# Patient Record
Sex: Female | Born: 1960 | Race: White | Hispanic: No | Marital: Married | Smoking: Never smoker
Health system: Southern US, Community
[De-identification: ages and names within clinical notes are randomized; demographics above are authoritative.]

## PROBLEM LIST (undated history)

## (undated) DIAGNOSIS — K219 Gastro-esophageal reflux disease without esophagitis: Secondary | ICD-10-CM

## (undated) DIAGNOSIS — M549 Dorsalgia, unspecified: Secondary | ICD-10-CM

## (undated) DIAGNOSIS — H9319 Tinnitus, unspecified ear: Secondary | ICD-10-CM

## (undated) DIAGNOSIS — M1990 Unspecified osteoarthritis, unspecified site: Secondary | ICD-10-CM

## (undated) DIAGNOSIS — Z8742 Personal history of other diseases of the female genital tract: Secondary | ICD-10-CM

## (undated) DIAGNOSIS — N6002 Solitary cyst of left breast: Secondary | ICD-10-CM

## (undated) DIAGNOSIS — F4310 Post-traumatic stress disorder, unspecified: Secondary | ICD-10-CM

## (undated) DIAGNOSIS — T7840XA Allergy, unspecified, initial encounter: Secondary | ICD-10-CM

## (undated) DIAGNOSIS — F419 Anxiety disorder, unspecified: Secondary | ICD-10-CM

## (undated) DIAGNOSIS — R519 Headache, unspecified: Secondary | ICD-10-CM

## (undated) HISTORY — DX: Solitary cyst of left breast: N60.02

## (undated) HISTORY — PX: SPINE SURGERY: SHX786

## (undated) HISTORY — PX: LAPAROSCOPIC VAGINAL HYSTERECTOMY: SUR798

## (undated) HISTORY — DX: Post-traumatic stress disorder, unspecified: F43.10

## (undated) HISTORY — DX: Personal history of other diseases of the female genital tract: Z87.42

## (undated) HISTORY — DX: Anxiety disorder, unspecified: F41.9

## (undated) HISTORY — DX: Gastro-esophageal reflux disease without esophagitis: K21.9

## (undated) HISTORY — DX: Tinnitus, unspecified ear: H93.19

## (undated) HISTORY — DX: Dorsalgia, unspecified: M54.9

## (undated) HISTORY — DX: Allergy, unspecified, initial encounter: T78.40XA

---

## 2009-03-16 ENCOUNTER — Ambulatory Visit: Payer: Self-pay | Admitting: Psychology

## 2009-05-10 ENCOUNTER — Ambulatory Visit: Payer: Self-pay | Admitting: Gastroenterology

## 2009-08-18 ENCOUNTER — Encounter
Admission: RE | Admit: 2009-08-18 | Discharge: 2009-08-23 | Payer: Self-pay | Admitting: Physical Medicine & Rehabilitation

## 2009-08-23 ENCOUNTER — Ambulatory Visit: Payer: Self-pay | Admitting: Physical Medicine & Rehabilitation

## 2014-06-13 DIAGNOSIS — Z800 Family history of malignant neoplasm of digestive organs: Secondary | ICD-10-CM | POA: Insufficient documentation

## 2014-06-13 DIAGNOSIS — N959 Unspecified menopausal and perimenopausal disorder: Secondary | ICD-10-CM | POA: Insufficient documentation

## 2014-06-13 DIAGNOSIS — R61 Generalized hyperhidrosis: Secondary | ICD-10-CM | POA: Insufficient documentation

## 2014-06-13 DIAGNOSIS — R252 Cramp and spasm: Secondary | ICD-10-CM | POA: Insufficient documentation

## 2014-06-13 DIAGNOSIS — G4700 Insomnia, unspecified: Secondary | ICD-10-CM | POA: Insufficient documentation

## 2014-06-13 DIAGNOSIS — Z9889 Other specified postprocedural states: Secondary | ICD-10-CM | POA: Insufficient documentation

## 2014-06-13 DIAGNOSIS — R0982 Postnasal drip: Secondary | ICD-10-CM | POA: Insufficient documentation

## 2014-06-13 DIAGNOSIS — K219 Gastro-esophageal reflux disease without esophagitis: Secondary | ICD-10-CM | POA: Insufficient documentation

## 2014-08-22 DIAGNOSIS — Z8669 Personal history of other diseases of the nervous system and sense organs: Secondary | ICD-10-CM | POA: Insufficient documentation

## 2014-08-31 DIAGNOSIS — R110 Nausea: Secondary | ICD-10-CM | POA: Insufficient documentation

## 2016-05-27 HISTORY — PX: SPINAL FUSION: SHX223

## 2017-05-27 DIAGNOSIS — Z87442 Personal history of urinary calculi: Secondary | ICD-10-CM

## 2017-05-27 HISTORY — DX: Personal history of urinary calculi: Z87.442

## 2017-05-27 HISTORY — PX: KIDNEY STONE SURGERY: SHX686

## 2019-08-12 ENCOUNTER — Ambulatory Visit (INDEPENDENT_AMBULATORY_CARE_PROVIDER_SITE_OTHER): Payer: 59 | Admitting: Adult Health

## 2019-08-12 ENCOUNTER — Encounter: Payer: Self-pay | Admitting: Adult Health

## 2019-08-12 ENCOUNTER — Other Ambulatory Visit: Payer: Self-pay

## 2019-08-12 VITALS — BP 130/86 | HR 87 | Temp 96.9°F | Resp 16 | Ht 68.0 in | Wt 167.8 lb

## 2019-08-12 DIAGNOSIS — Z87898 Personal history of other specified conditions: Secondary | ICD-10-CM

## 2019-08-12 DIAGNOSIS — R05 Cough: Secondary | ICD-10-CM

## 2019-08-12 DIAGNOSIS — Z9889 Other specified postprocedural states: Secondary | ICD-10-CM

## 2019-08-12 DIAGNOSIS — E785 Hyperlipidemia, unspecified: Secondary | ICD-10-CM | POA: Insufficient documentation

## 2019-08-12 DIAGNOSIS — R031 Nonspecific low blood-pressure reading: Secondary | ICD-10-CM | POA: Insufficient documentation

## 2019-08-12 DIAGNOSIS — N390 Urinary tract infection, site not specified: Secondary | ICD-10-CM | POA: Insufficient documentation

## 2019-08-12 DIAGNOSIS — M25551 Pain in right hip: Secondary | ICD-10-CM

## 2019-08-12 DIAGNOSIS — Z13220 Encounter for screening for lipoid disorders: Secondary | ICD-10-CM

## 2019-08-12 DIAGNOSIS — Z0000 Encounter for general adult medical examination without abnormal findings: Secondary | ICD-10-CM

## 2019-08-12 DIAGNOSIS — G8929 Other chronic pain: Secondary | ICD-10-CM

## 2019-08-12 DIAGNOSIS — M25571 Pain in right ankle and joints of right foot: Secondary | ICD-10-CM

## 2019-08-12 DIAGNOSIS — M25562 Pain in left knee: Secondary | ICD-10-CM | POA: Diagnosis not present

## 2019-08-12 DIAGNOSIS — M5126 Other intervertebral disc displacement, lumbar region: Secondary | ICD-10-CM | POA: Insufficient documentation

## 2019-08-12 DIAGNOSIS — Z90711 Acquired absence of uterus with remaining cervical stump: Secondary | ICD-10-CM

## 2019-08-12 DIAGNOSIS — E349 Endocrine disorder, unspecified: Secondary | ICD-10-CM | POA: Insufficient documentation

## 2019-08-12 DIAGNOSIS — N200 Calculus of kidney: Secondary | ICD-10-CM | POA: Insufficient documentation

## 2019-08-12 DIAGNOSIS — F419 Anxiety disorder, unspecified: Secondary | ICD-10-CM | POA: Insufficient documentation

## 2019-08-12 DIAGNOSIS — Z79890 Hormone replacement therapy (postmenopausal): Secondary | ICD-10-CM

## 2019-08-12 DIAGNOSIS — R059 Cough, unspecified: Secondary | ICD-10-CM

## 2019-08-12 DIAGNOSIS — E559 Vitamin D deficiency, unspecified: Secondary | ICD-10-CM

## 2019-08-12 DIAGNOSIS — Z8659 Personal history of other mental and behavioral disorders: Secondary | ICD-10-CM

## 2019-08-12 DIAGNOSIS — M5431 Sciatica, right side: Secondary | ICD-10-CM | POA: Insufficient documentation

## 2019-08-12 DIAGNOSIS — IMO0002 Reserved for concepts with insufficient information to code with codable children: Secondary | ICD-10-CM | POA: Insufficient documentation

## 2019-08-12 DIAGNOSIS — Z1231 Encounter for screening mammogram for malignant neoplasm of breast: Secondary | ICD-10-CM | POA: Diagnosis not present

## 2019-08-12 DIAGNOSIS — F4310 Post-traumatic stress disorder, unspecified: Secondary | ICD-10-CM | POA: Insufficient documentation

## 2019-08-12 DIAGNOSIS — Z8739 Personal history of other diseases of the musculoskeletal system and connective tissue: Secondary | ICD-10-CM

## 2019-08-12 DIAGNOSIS — R062 Wheezing: Secondary | ICD-10-CM | POA: Diagnosis not present

## 2019-08-12 DIAGNOSIS — R32 Unspecified urinary incontinence: Secondary | ICD-10-CM | POA: Insufficient documentation

## 2019-08-12 DIAGNOSIS — IMO0001 Reserved for inherently not codable concepts without codable children: Secondary | ICD-10-CM | POA: Insufficient documentation

## 2019-08-12 DIAGNOSIS — M7631 Iliotibial band syndrome, right leg: Secondary | ICD-10-CM

## 2019-08-12 DIAGNOSIS — R058 Other specified cough: Secondary | ICD-10-CM

## 2019-08-12 DIAGNOSIS — G5700 Lesion of sciatic nerve, unspecified lower limb: Secondary | ICD-10-CM | POA: Insufficient documentation

## 2019-08-12 DIAGNOSIS — M542 Cervicalgia: Secondary | ICD-10-CM | POA: Insufficient documentation

## 2019-08-12 LAB — POCT URINALYSIS DIPSTICK
Bilirubin, UA: NEGATIVE
Blood, UA: NEGATIVE
Glucose, UA: NEGATIVE
Ketones, UA: NEGATIVE
Leukocytes, UA: NEGATIVE
Nitrite, UA: NEGATIVE
Protein, UA: NEGATIVE
Spec Grav, UA: 1.01 (ref 1.010–1.025)
Urobilinogen, UA: 0.2 E.U./dL
pH, UA: 7.5 (ref 5.0–8.0)

## 2019-08-12 MED ORDER — ALBUTEROL SULFATE HFA 108 (90 BASE) MCG/ACT IN AERS: 1.0000 | INHALATION_SPRAY | Freq: Four times a day (QID) | RESPIRATORY_TRACT | 1 refills | Status: DC | PRN

## 2019-08-12 NOTE — Progress Notes (Signed)
Urine was negative. 

## 2019-08-12 NOTE — Patient Instructions (Addendum)
Call to schedule your screening mammogram. Your orders have been placed for your exam.  Let our office know if you have questions, concerns, or any difficulty scheduling.  If normal results then yearly screening mammograms are recommended unless you notice  Changes in your breast then you should schedule a follow up office visit. If abnormal results  Further imaging will be warranted and sooner follow up as determined by the radiologist at the [facility].   [facility] [place], [place] [address]  Main: [phone]      Albuterol inhalation aerosol What is this medicine? ALBUTEROL (al Normajean Glasgow) is a bronchodilator. It helps open up the airways in your lungs to make it easier to breathe. This medicine is used to treat and to prevent bronchospasm. This medicine may be used for other purposes; ask your health care provider or pharmacist if you have questions. COMMON BRAND NAME(S): Proair HFA, Proventil, Proventil HFA, Respirol, Ventolin, Ventolin HFA What should I tell my health care provider before I take this medicine? They need to know if you have any of the following conditions:  diabetes  heart disease or irregular heartbeat  high blood pressure  pheochromocytoma  seizures  thyroid disease  an unusual or allergic reaction to albuterol, levalbuterol, other medicines, foods, dyes, or preservatives  pregnant or trying to get pregnant  breast-feeding How should I use this medicine? This medicine is for inhalation through the mouth. Follow the directions on your prescription label. Take your medicine at regular intervals. Do not use more often than directed. Make sure that you are using your inhaler correctly. Ask your doctor or health care provider if you have any questions. Talk to your pediatrician regarding the use of this medicine in children. While this drug may be prescribed for children as young as 4 years for  selected conditions, precautions do apply. Overdosage: If you think you have taken too much of this medicine contact a poison control center or emergency room at once. NOTE: This medicine is only for you. Do not share this medicine with others. What if I miss a dose? If you miss a dose, use it as soon as you can. If it is almost time for your next dose, use only that dose. Do not use double or extra doses. What may interact with this medicine?  anti-infectives like chloroquine and pentamidine  caffeine  cisapride  diuretics  medicines for colds  medicines for depression or for emotional or psychotic conditions  medicines for weight loss including some herbal products  methadone  some antibiotics like clarithromycin, erythromycin, levofloxacin, and linezolid  some heart medicines  steroid hormones like dexamethasone, cortisone, hydrocortisone  theophylline  thyroid hormones This list may not describe all possible interactions. Give your health care provider a list of all the medicines, herbs, non-prescription drugs, or dietary supplements you use. Also tell them if you smoke, drink alcohol, or use illegal drugs. Some items may interact with your medicine. What should I watch for while using this medicine? Tell your doctor or health care professional if your symptoms do not improve. Do not use extra albuterol. If your asthma or bronchitis gets worse while you are using this medicine, call your doctor right away. If your mouth gets dry try chewing sugarless gum or sucking hard candy. Drink water as directed. What side effects may I notice from receiving this medicine? Side effects that you should report to your doctor or health care professional  as soon as possible:  allergic reactions like skin rash, itching or hives, swelling of the face, lips, or tongue  breathing problems  chest pain  feeling faint or lightheaded, falls  high blood pressure  irregular  heartbeat  fever  muscle cramps or weakness  pain, tingling, numbness in the hands or feet  vomiting Side effects that usually do not require medical attention (report to your doctor or health care professional if they continue or are bothersome):  changes in taste  cough  dry mouth  headache  nervousness or trembling  stomach upset  stuffy or runny nose  throat irritation  trouble sleeping This list may not describe all possible side effects. Call your doctor for medical advice about side effects. You may report side effects to FDA at 1-800-FDA-1088. Where should I keep my medicine? Keep out of the reach of children. Store Proventil HFA and ProAir HFA at room temperature between 15 and 25 degrees C (59 and 77 degrees F). Store Ventolin HFA at room temperature between 20 and 25 degrees C (68 and 77 degrees F); it may be stored between 15 and 30 degrees C (59 and 86 degrees F) on occasion. The contents are under pressure and may burst when exposed to heat or flame. Do not freeze. This medicine does not work as well if it is too cold. Throw away the inhaler when the dose counter displays "0" or after the expiration date on the package, whichever comes first. Ventolin HFA should be thrown away 12 months after removing it from the foil pouch. NOTE: This sheet is a summary. It may not cover all possible information. If you have questions about this medicine, talk to your doctor, pharmacist, or health care provider.  2020 Elsevier/Gold Standard (2018-08-27 12:46:54)
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
## 2019-08-13 ENCOUNTER — Telehealth: Payer: Self-pay | Admitting: Obstetrics and Gynecology

## 2019-08-13 NOTE — Telephone Encounter (Signed)
[facility] referring for Hormone replacement therapy, postmenopausal. Wants female provider. Called and left voicemail for patient to call back to be schedule

## 2019-08-16 ENCOUNTER — Encounter: Payer: Self-pay | Admitting: Adult Health

## 2019-08-16 DIAGNOSIS — R058 Other specified cough: Secondary | ICD-10-CM | POA: Insufficient documentation

## 2019-08-16 DIAGNOSIS — M25562 Pain in left knee: Secondary | ICD-10-CM | POA: Insufficient documentation

## 2019-08-16 DIAGNOSIS — Z8739 Personal history of other diseases of the musculoskeletal system and connective tissue: Secondary | ICD-10-CM | POA: Insufficient documentation

## 2019-08-16 DIAGNOSIS — Z114 Encounter for screening for human immunodeficiency virus [HIV]: Secondary | ICD-10-CM | POA: Insufficient documentation

## 2019-08-16 DIAGNOSIS — Z90711 Acquired absence of uterus with remaining cervical stump: Secondary | ICD-10-CM | POA: Insufficient documentation

## 2019-08-16 DIAGNOSIS — M25551 Pain in right hip: Secondary | ICD-10-CM | POA: Insufficient documentation

## 2019-08-16 DIAGNOSIS — R05 Cough: Secondary | ICD-10-CM | POA: Insufficient documentation

## 2019-08-16 DIAGNOSIS — E559 Vitamin D deficiency, unspecified: Secondary | ICD-10-CM | POA: Insufficient documentation

## 2019-08-16 DIAGNOSIS — Z79890 Hormone replacement therapy (postmenopausal): Secondary | ICD-10-CM | POA: Insufficient documentation

## 2019-08-16 DIAGNOSIS — Z13220 Encounter for screening for lipoid disorders: Secondary | ICD-10-CM | POA: Insufficient documentation

## 2019-08-16 DIAGNOSIS — M25571 Pain in right ankle and joints of right foot: Secondary | ICD-10-CM | POA: Insufficient documentation

## 2019-08-16 DIAGNOSIS — Z8659 Personal history of other mental and behavioral disorders: Secondary | ICD-10-CM | POA: Insufficient documentation

## 2019-08-16 DIAGNOSIS — G8929 Other chronic pain: Secondary | ICD-10-CM | POA: Insufficient documentation

## 2019-08-16 DIAGNOSIS — M7631 Iliotibial band syndrome, right leg: Secondary | ICD-10-CM | POA: Insufficient documentation

## 2019-08-16 DIAGNOSIS — R062 Wheezing: Secondary | ICD-10-CM | POA: Insufficient documentation

## 2019-08-18 ENCOUNTER — Other Ambulatory Visit: Payer: Self-pay | Admitting: Adult Health

## 2019-08-18 ENCOUNTER — Ambulatory Visit
Admission: RE | Admit: 2019-08-18 | Discharge: 2019-08-18 | Disposition: A | Discharge status: Home / Self Care | Payer: 59 | Source: Ambulatory Visit | Attending: Adult Health | Admitting: Adult Health

## 2019-08-18 ENCOUNTER — Other Ambulatory Visit: Payer: Self-pay

## 2019-08-18 DIAGNOSIS — K2100 Gastro-esophageal reflux disease with esophagitis, without bleeding: Secondary | ICD-10-CM

## 2019-08-18 DIAGNOSIS — R05 Cough: Secondary | ICD-10-CM | POA: Diagnosis present

## 2019-08-18 DIAGNOSIS — D126 Benign neoplasm of colon, unspecified: Secondary | ICD-10-CM

## 2019-08-18 IMAGING — CR DG CHEST 2V
1 series · 2 of 2 positions shown · non-contrast
Comparison: None.

CLINICAL DATA: Productive cough and congestion

EXAM:
CHEST - 2 VIEW

[Series 1: dg chest 2 view · 0.14mm/px · 2 of 2 slices shown]
[im 1/2]
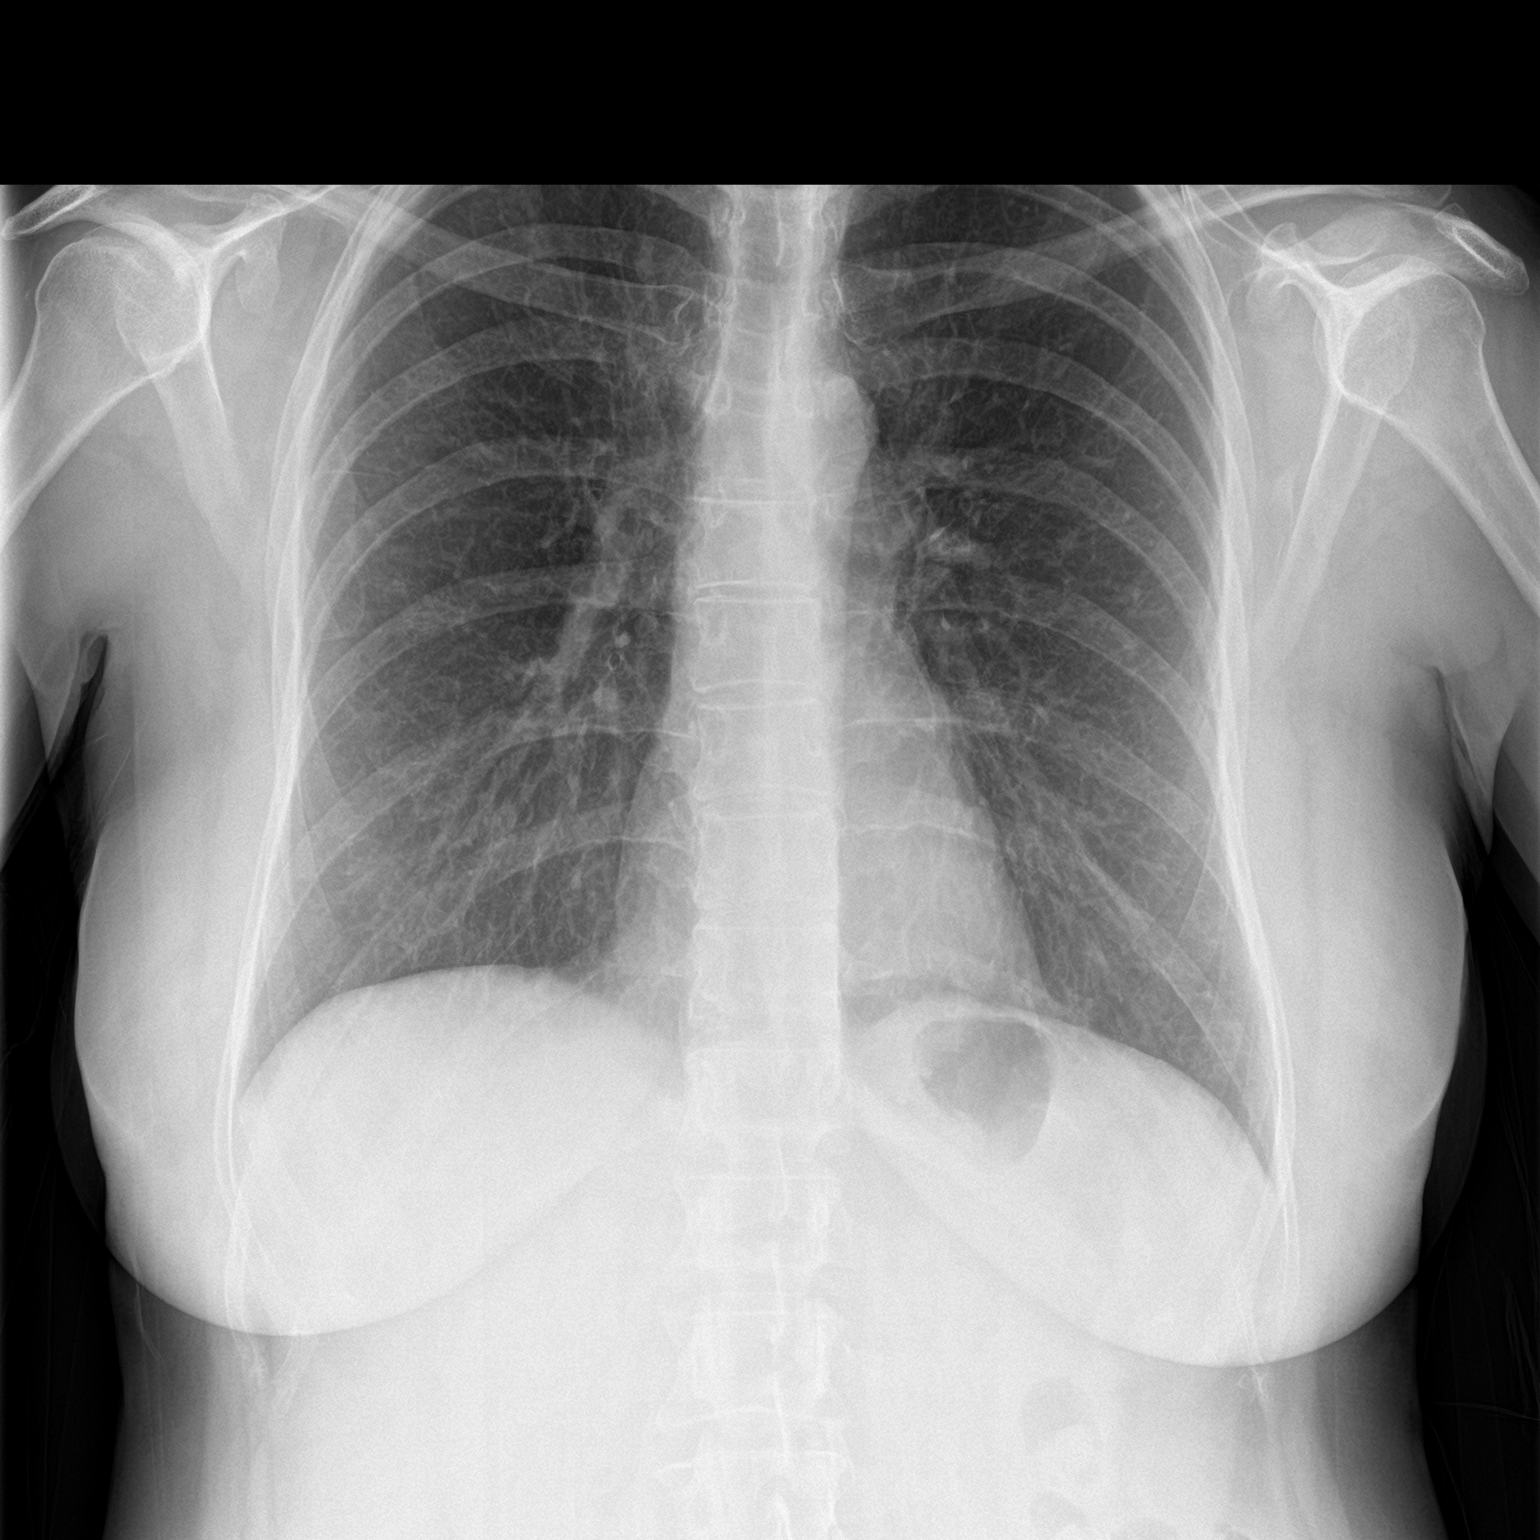
[im 2/2]
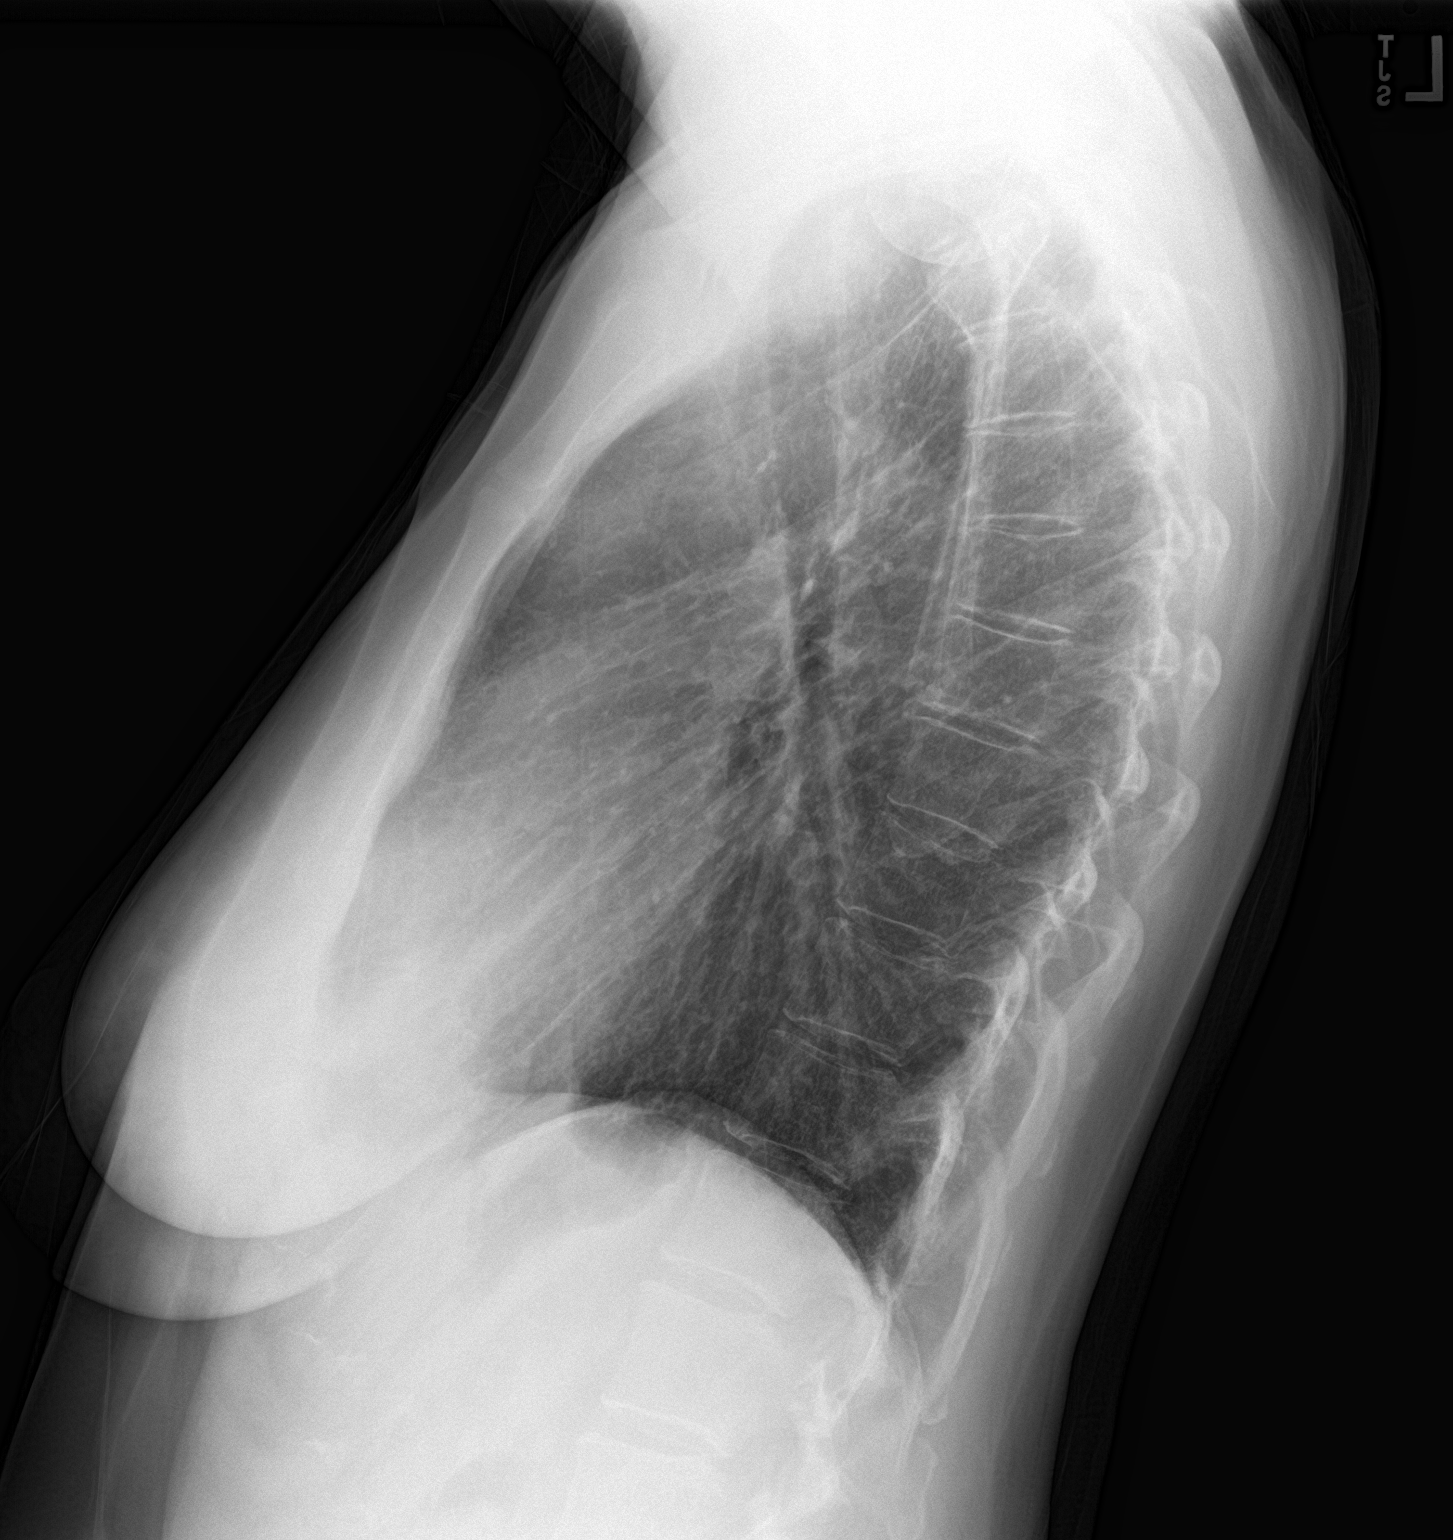

[2 of 2 positions shown; findings below may reference images not displayed]

FINDINGS: Lungs are clear. Heart size and pulmonary vascularity are normal. No
adenopathy. No bone lesions.
IMPRESSION: No abnormality noted.

## 2019-08-18 NOTE — Progress Notes (Signed)
Chest x ray is within normal limits.

## 2019-08-18 NOTE — Addendum Note (Signed)
Addended by: Doreen Beam on: 08/18/2019 08:58 AM   Modules accepted: Orders

## 2019-08-20 ENCOUNTER — Other Ambulatory Visit: Payer: Self-pay | Admitting: Adult Health

## 2019-08-20 DIAGNOSIS — R05 Cough: Secondary | ICD-10-CM

## 2019-08-20 DIAGNOSIS — R058 Other specified cough: Secondary | ICD-10-CM

## 2019-08-20 DIAGNOSIS — A493 Mycoplasma infection, unspecified site: Secondary | ICD-10-CM

## 2019-08-20 MED ORDER — BENZONATATE 100 MG PO CAPS: ORAL_CAPSULE | ORAL | 0 refills | Status: DC

## 2019-08-20 MED ORDER — AZITHROMYCIN 250 MG PO TABS: ORAL_TABLET | ORAL | 0 refills | Status: DC

## 2019-08-20 MED ORDER — PREDNISONE 10 MG (21) PO TBPK: ORAL_TABLET | ORAL | 0 refills | Status: DC

## 2019-08-20 NOTE — Progress Notes (Signed)
Provider called patient 08/20/2019 8:45 am to review within normal limits chest x ray and lab work that has resulted so far. Quantiferon Gold, testosterone and estrogen free/ total as well as progesterone is pending, she will be seeing [facility] Din for gynecology 08/25/2019  1.She has had productive cough with green / yellow sputum, since November, she has IGG positive for Mycoplasma pneumonia, IGM is negative. She denies any previous infection. Suspect she had Mycoplasma pneumonia in November and will treat as such and see if she has improvement in symptoms.  She will follow back up with me in 2 weeks. She is in agreement to azithromycin and Prednisone dose pack. She will use Benzonatate as needed.  2.CBC is within normal limits no anemia, or signs of blood infection. CMP glucose 100, discussed, kidney and liver function within normal limits.   3.Total cholesterol and LDL elevated.  Discuss lifestyle modification with patient e.g. increase exercise, fiber, fruits, vegetables, lean meat, and omega 3/fish intake and decrease saturated fat. Her cholesterol is elevated she has not been exercising as much due to her joint pain, of which she is going to orthopedics for. She does eat a lot of eggs, has dairy and red meat, she will try to cut some of these down and exercise as tolerated. Will recheck lipids in 4 to 6 months. Dad has history of high cholesterol. Discussed egg whites are ok, and she avoids processed foods already. She does not want a statin at this time. Will continue to monitor.

## 2019-08-20 NOTE — Progress Notes (Signed)
Meds ordered this encounter  Medications  . azithromycin (ZITHROMAX) 250 MG tablet    Sig: By mouth Take 2 tablets day 1 (500mg  total) and 1 tablet ( 250 mg ) on days 2,3,4,5.    Dispense:  6 tablet    Refill:  0  . predniSONE (STERAPRED UNI-PAK 21 TAB) 10 MG (21) TBPK tablet    Sig: PO: Take 6 tablets on day 1:Take 5 tablets day 2:Take 4 tablets day 3: Take 3 tablets day 4:Take 2 tablets day five: 5 Take 1 tablet day 6    Dispense:  21 tablet    Refill:  0  . benzonatate (TESSALON) 100 MG capsule    Sig: 100 mg (one capsule) up to  200 mg(two capsules) by mouth  TID only PRN. May cause drowsiness.    Dispense:  50 capsule    Refill:  0    Follow up in two weeks. Productive cough since November.  Mycoplasma IGG positive for previous infection.IGM negative.  She does not ever remember having any respiratory infections prior. She has had green sputum since November it has lightened in color. Cough at night. Recommend covid testing as well.  Follow up recheck in 2 weeks by phone. In person evaluation if worsens at anytime.

## 2019-08-21 LAB — HEMOGLOBIN A1C
Est. average glucose Bld gHb Est-mCnc: 114 mg/dL
Hgb A1c MFr Bld: 5.6 % (ref 4.8–5.6)

## 2019-08-21 LAB — SPECIMEN STATUS REPORT

## 2019-08-22 LAB — COMPREHENSIVE METABOLIC PANEL
ALT: 21 IU/L (ref 0–32)
AST: 19 IU/L (ref 0–40)
Albumin/Globulin Ratio: 1.7 (ref 1.2–2.2)
Albumin: 4.3 g/dL (ref 3.8–4.9)
Alkaline Phosphatase: 80 IU/L (ref 39–117)
BUN/Creatinine Ratio: 23 (ref 9–23)
BUN: 22 mg/dL (ref 6–24)
Bilirubin Total: 0.4 mg/dL (ref 0.0–1.2)
CO2: 23 mmol/L (ref 20–29)
Calcium: 9.3 mg/dL (ref 8.7–10.2)
Chloride: 100 mmol/L (ref 96–106)
Creatinine, Ser: 0.97 mg/dL (ref 0.57–1.00)
GFR calc Af Amer: 74 mL/min/{1.73_m2} (ref >59)
GFR calc non Af Amer: 65 mL/min/{1.73_m2} (ref >59)
Globulin, Total: 2.5 g/dL (ref 1.5–4.5)
Glucose: 100 mg/dL — ABNORMAL HIGH (ref 65–99)
Potassium: 4.1 mmol/L (ref 3.5–5.2)
Sodium: 137 mmol/L (ref 134–144)
Total Protein: 6.8 g/dL (ref 6.0–8.5)

## 2019-08-22 LAB — CBC WITH DIFFERENTIAL/PLATELET
Basophils Absolute: 0.1 10*3/uL (ref 0.0–0.2)
Basos: 1 %
EOS (ABSOLUTE): 0.2 10*3/uL (ref 0.0–0.4)
Eos: 2 %
Hematocrit: 39.4 % (ref 34.0–46.6)
Hemoglobin: 13.3 g/dL (ref 11.1–15.9)
Immature Grans (Abs): 0 10*3/uL (ref 0.0–0.1)
Immature Granulocytes: 0 %
Lymphocytes Absolute: 1.3 10*3/uL (ref 0.7–3.1)
Lymphs: 17 %
MCH: 30.7 pg (ref 26.6–33.0)
MCHC: 33.8 g/dL (ref 31.5–35.7)
MCV: 91 fL (ref 79–97)
Monocytes Absolute: 0.6 10*3/uL (ref 0.1–0.9)
Monocytes: 7 %
Neutrophils Absolute: 5.5 10*3/uL (ref 1.4–7.0)
Neutrophils: 73 %
Platelets: 261 10*3/uL (ref 150–450)
RBC: 4.33 x10E6/uL (ref 3.77–5.28)
RDW: 12.7 % (ref 11.7–15.4)
WBC: 7.6 10*3/uL (ref 3.4–10.8)

## 2019-08-22 LAB — LIPID PANEL W/O CHOL/HDL RATIO
Cholesterol, Total: 226 mg/dL — ABNORMAL HIGH (ref 100–199)
HDL: 45 mg/dL (ref >39)
LDL Chol Calc (NIH): 164 mg/dL — ABNORMAL HIGH (ref 0–99)
Triglycerides: 93 mg/dL (ref 0–149)
VLDL Cholesterol Cal: 17 mg/dL (ref 5–40)

## 2019-08-22 LAB — TSH: TSH: 2.76 u[IU]/mL (ref 0.450–4.500)

## 2019-08-22 LAB — ESTROGENS, TOTAL: Estrogen: 75 pg/mL

## 2019-08-22 LAB — TESTOSTERONE,FREE AND TOTAL
Testosterone, Free: 0.5 pg/mL (ref 0.0–4.2)
Testosterone: <3 ng/dL — ABNORMAL LOW (ref 3–41)

## 2019-08-22 LAB — QUANTIFERON-TB GOLD PLUS
QuantiFERON Mitogen Value: >10 IU/mL
QuantiFERON Nil Value: 0.03 IU/mL
QuantiFERON TB1 Ag Value: 0.03 IU/mL
QuantiFERON TB2 Ag Value: 0.02 IU/mL
QuantiFERON-TB Gold Plus: NEGATIVE

## 2019-08-22 LAB — MYCOPLASMA PNEUMONIAE,IGG,IGM
Mycoplasma pneumo IgG: 172 U/mL — ABNORMAL HIGH (ref 0–99)
Mycoplasma pneumo IgM: <770 U/mL (ref 0–769)

## 2019-08-22 LAB — PROGESTERONE: Progesterone: 0.1 ng/mL

## 2019-08-22 LAB — VITAMIN D 25 HYDROXY (VIT D DEFICIENCY, FRACTURES): Vit D, 25-Hydroxy: 60.3 ng/mL (ref 30.0–100.0)

## 2019-08-24 ENCOUNTER — Telehealth: Payer: Self-pay

## 2019-08-24 NOTE — Telephone Encounter (Signed)
I reached out to patient to review over lab results and to advise her that we added a HgbA1C to blood work that was drawn. [facility] triage can advise patient. KW

## 2019-08-24 NOTE — Progress Notes (Signed)
Flinchum, Kelby Aline, FNP   Chief Complaint  Patient presents with  . HRT    needs someone to manage her HRT  . Rash    around/near anus    HPI:      Ms. BRUNELLA TRANSOU is a 59 y.o. No obstetric history on file. who LMP was No LMP recorded. Patient has had a hysterectomy., presents today for NP mgmt for HRT, referred by PCP. Pt is on vivelle dot patch 0.075 mg, increased from 0.05 mg dose recently due to persistent vasomotor sx. Pt's sx improved. Rx'd by different MD but pt recently moved here and needs to establish new provider.  Also with increased anxiety/depression and insomnia with menopause, but sx improved (not resolved) with ERT. Still has to take xanax 0.25 mg occas prn anxiety and lunesta about twice wkly for insomnia. Never tried progesterone but was reading about it. Did testosterone pellet in past and then in cream form, but started to have hair loss and increased facial hair. Did have improved libido. No longer doing testosterone. Had normal estradiol labs, low progesterone (normal for postmenopause) and total testosterone with normal free testosterone with PCP recently. S/p hyst due to endometriosis.  She is sex active, occas vag drness. No pain/bleeding. No hx of abn paps. No other vag sx.   Also has rectal irritation/rash for several months. Treated with monistat without relief. Wears cotton lined underwear and pantyliners. No vag sx.   Last mammo 2016 I can see in Epic, but done more recently per pt; new one ordered through PCP 2021 Colonoscopy 2019  Past Medical History:  Diagnosis Date  . Allergy   . Anxiety   . Back pain   . GERD (gastroesophageal reflux disease)   . PTSD (post-traumatic stress disorder)     Past Surgical History:  Procedure Laterality Date  . ABDOMINAL HYSTERECTOMY  2019  . SPINAL FUSION  2018  . SPINE SURGERY      Family History  Problem Relation Age of Onset  . Autoimmune disease Mother   . Heart attack Father 16  . Cancer -  Colon Maternal Grandmother   . Alzheimer's disease Maternal Grandmother   . Heart attack Maternal Grandfather   . Bone cancer Paternal Grandmother     Social History   Socioeconomic History  . Marital status: Married    Spouse name: Not on file  . Number of children: Not on file  . Years of education: Not on file  . Highest education level: Not on file  Occupational History  . Not on file  Tobacco Use  . Smoking status: Never Smoker  . Smokeless tobacco: Never Used  Substance and Sexual Activity  . Alcohol use: Yes    Alcohol/week: 1.0 standard drinks    Types: 1 Glasses of wine per week  . Drug use: Never  . Sexual activity: Yes    Birth control/protection: None, Surgical    Comment: Hysterectomy  Other Topics Concern  . Not on file  Social History Narrative  . Not on file   Social Determinants of Health   Financial Resource Strain:   . Difficulty of Paying Living Expenses:   Food Insecurity:   . Worried About Charity fundraiser in the Last Year:   . Arboriculturist in the Last Year:   Transportation Needs:   . Film/video editor (Medical):   Marland Kitchen Lack of Transportation (Non-Medical):   Physical Activity:   . Days of Exercise per  Week:   . Minutes of Exercise per Session:   Stress:   . Feeling of Stress :   Social Connections:   . Frequency of Communication with Friends and Family:   . Frequency of Social Gatherings with Friends and Family:   . Attends Religious Services:   . Active Member of Clubs or Organizations:   . Attends Archivist Meetings:   Marland Kitchen Marital Status:   Intimate Partner Violence:   . Fear of Current or Ex-Partner:   . Emotionally Abused:   Marland Kitchen Physically Abused:   . Sexually Abused:     Outpatient Medications Prior to Visit  Medication Sig Dispense Refill  . albuterol (VENTOLIN HFA) 108 (90 Base) MCG/ACT inhaler Inhale 1-2 puffs into the lungs every 6 (six) hours as needed for wheezing or shortness of breath. 18 g 1  .  ALPRAZolam (XANAX) 0.5 MG tablet Take 0.5 mg by mouth 3 (three) times daily.    Marland Kitchen azithromycin (ZITHROMAX) 250 MG tablet By mouth Take 2 tablets day 1 (500mg  total) and 1 tablet ( 250 mg ) on days 2,3,4,5. 6 tablet 0  . benzonatate (TESSALON) 100 MG capsule 100 mg (one capsule) up to  200 mg(two capsules) by mouth  TID only PRN. May cause drowsiness. 50 capsule 0  . Cetirizine HCl 10 MG CAPS Take by mouth.    . eszopiclone (LUNESTA) 2 MG TABS tablet Take by mouth.    . loratadine (CLARITIN) 10 MG tablet Take by mouth.    . methocarbamol (ROBAXIN) 500 MG tablet Take 1,000 mg by mouth 4 (four) times daily.    Marland Kitchen omeprazole (PRILOSEC) 40 MG capsule Take 40 mg by mouth 2 (two) times daily.    . predniSONE (STERAPRED UNI-PAK 21 TAB) 10 MG (21) TBPK tablet PO: Take 6 tablets on day 1:Take 5 tablets day 2:Take 4 tablets day 3: Take 3 tablets day 4:Take 2 tablets day five: 5 Take 1 tablet day 6 21 tablet 0  . sertraline (ZOLOFT) 50 MG tablet Take 50 mg by mouth daily.    . SUMAtriptan (IMITREX) 100 MG tablet Take by mouth.    . estradiol (VIVELLE-DOT) 0.075 MG/24HR Place 1 patch onto the skin 2 (two) times a week.     No facility-administered medications prior to visit.      ROS:  Review of Systems  Constitutional: Negative for fever.  Gastrointestinal: Negative for blood in stool, constipation, diarrhea, nausea and vomiting.  Genitourinary: Negative for dyspareunia, dysuria, flank pain, frequency, hematuria, urgency, vaginal bleeding, vaginal discharge and vaginal pain.  Musculoskeletal: Negative for back pain.  Skin: Positive for rash.  Psychiatric/Behavioral: Positive for agitation.  BREAST: No symptoms   OBJECTIVE:   Vitals:  BP 110/70   Ht 5\' 8"  (1.727 m)   Wt 167 lb (75.8 kg)   BMI 25.39 kg/m   Physical Exam Vitals reviewed.  Constitutional:      Appearance: She is well-developed.  Pulmonary:     Effort: Pulmonary effort is normal.  Genitourinary:    General: Normal vulva.      Pubic Area: No rash.      Labia:        Right: No rash, tenderness or lesion.        Left: No rash, tenderness or lesion.      Vagina: Normal. No vaginal discharge, erythema or tenderness.     Uterus: Absent. Not enlarged and not tender.      Adnexa: Right adnexa normal and left adnexa  normal.       Right: No mass or tenderness.         Left: No mass or tenderness.       Rectum: No anal fissure or external hemorrhoid.       Comments: ERYTHEMATOUS RASH BILAT PERINEAL AREA; BORDER WITH SCALE Musculoskeletal:        General: Normal range of motion.     Cervical back: Normal range of motion.  Skin:    General: Skin is warm and dry.  Neurological:     General: No focal deficit present.     Mental Status: She is alert and oriented to person, place, and time.  Psychiatric:        Mood and Affect: Mood normal.        Behavior: Behavior normal.        Thought Content: Thought content normal.        Judgment: Judgment normal.     Assessment/Plan: Vasomotor symptoms due to menopause - Plan: progesterone (PROMETRIUM) 100 MG capsule, estradiol (VIVELLE-DOT) 0.075 MG/24HR; Discussed sx. Will add prometrium to help with insomnia sx, may help with anxiety. Rx prometrium. F/u via phone in 3 months. If doing well, can try to decrease ERT to 0.05 mg dose again. Rx RF vivelle dot 0.075 mg in meantime. Also discussed testosterone but prefer to add prog first.   Hormone replacement therapy (HRT) - Plan: progesterone (PROMETRIUM) 100 MG capsule, estradiol (VIVELLE-DOT) 0.075 MG/24HR  Rectal itching - Plan: clotrimazole-betamethasone (LOTRISONE) cream; Tinear cruris. Rx lotrisone crm. Keep dry/desitin as moisture barrier. Cotton underwear. F/u prn.   Anxiety--takes xanax sparingly prn. F/u prn.   Other insomnia--takes lunesta. F/u prn.   Has mammo order for screening with PCP.   Meds ordered this encounter  Medications  . progesterone (PROMETRIUM) 100 MG capsule    Sig: Take 1 cap nightly,  6 nights on, 1 night off    Dispense:  90 capsule    Refill:  0    Order Specific Question:   Supervising Provider    Answer:   Gae Dry [id number]  . clotrimazole-betamethasone (LOTRISONE) cream    Sig: Apply externally BID for 2 wks    Dispense:  15 g    Refill:  0    Order Specific Question:   Supervising Provider    Answer:   Gae Dry [id number]  . estradiol (VIVELLE-DOT) 0.075 MG/24HR    Sig: Place 1 patch onto the skin 2 (two) times a week.    Dispense:  24 patch    Refill:  0    Order Specific Question:   Supervising Provider    Answer:   Gae Dry [id number]      Return in about 1 year (around 08/24/2020).  Georganna Maxson B. Anneka Studer, PA-C 08/25/2019 10:55 AM

## 2019-08-24 NOTE — Telephone Encounter (Signed)
Pt. Called back and given message. Verbalizes understanding. Sees her OB/GYN tomorrow.

## 2019-08-24 NOTE — Telephone Encounter (Signed)
-----   Message from Minette Headland, [place] sent at 08/23/2019 10:07 AM EDT ----- Attempted to reach out to patient, there is no voice answering service. Will try reaching out patient at a later time. KW

## 2019-08-25 ENCOUNTER — Encounter: Payer: Self-pay | Admitting: Obstetrics and Gynecology

## 2019-08-25 ENCOUNTER — Ambulatory Visit (INDEPENDENT_AMBULATORY_CARE_PROVIDER_SITE_OTHER): Payer: 59 | Admitting: Obstetrics and Gynecology

## 2019-08-25 ENCOUNTER — Other Ambulatory Visit: Payer: Self-pay

## 2019-08-25 ENCOUNTER — Telehealth: Payer: Self-pay | Admitting: Obstetrics and Gynecology

## 2019-08-25 VITALS — BP 110/70 | Ht 68.0 in | Wt 167.0 lb

## 2019-08-25 DIAGNOSIS — G4709 Other insomnia: Secondary | ICD-10-CM

## 2019-08-25 DIAGNOSIS — L290 Pruritus ani: Secondary | ICD-10-CM | POA: Diagnosis not present

## 2019-08-25 DIAGNOSIS — F419 Anxiety disorder, unspecified: Secondary | ICD-10-CM | POA: Diagnosis not present

## 2019-08-25 DIAGNOSIS — Z79890 Hormone replacement therapy (postmenopausal): Secondary | ICD-10-CM | POA: Diagnosis not present

## 2019-08-25 DIAGNOSIS — N951 Menopausal and female climacteric states: Secondary | ICD-10-CM | POA: Diagnosis not present

## 2019-08-25 MED ORDER — PROGESTERONE MICRONIZED 100 MG PO CAPS: ORAL_CAPSULE | ORAL | 0 refills | Status: DC

## 2019-08-25 MED ORDER — CLOTRIMAZOLE-BETAMETHASONE 1-0.05 % EX CREA: TOPICAL_CREAM | CUTANEOUS | 0 refills | Status: DC

## 2019-08-25 MED ORDER — ESTRADIOL 0.075 MG/24HR TD PTTW: 1.0000 | MEDICATED_PATCH | TRANSDERMAL | 0 refills | Status: DC

## 2019-08-25 NOTE — Patient Instructions (Signed)
I value your feedback and entrusting us with your care. If you get a [facility] patient survey, I would appreciate you taking the time to let us know about your experience today. Thank you!  As of May 06, 2019, your lab results will be released to your MyChart immediately, before I even have a chance to see them. Please give me time to review them and contact you if there are any abnormalities. Thank you for your patience.  

## 2019-08-25 NOTE — Telephone Encounter (Signed)
Rx eRxd.  

## 2019-08-25 NOTE — Telephone Encounter (Signed)
Patient does need refill on estradiol.  [facility].

## 2019-08-25 NOTE — Telephone Encounter (Signed)
Please see

## 2019-08-26 ENCOUNTER — Other Ambulatory Visit: Payer: Self-pay | Admitting: Obstetrics and Gynecology

## 2019-08-26 DIAGNOSIS — N951 Menopausal and female climacteric states: Secondary | ICD-10-CM

## 2019-08-26 DIAGNOSIS — Z79890 Hormone replacement therapy (postmenopausal): Secondary | ICD-10-CM

## 2019-10-13 ENCOUNTER — Ambulatory Visit: Payer: Self-pay | Admitting: Adult Health

## 2019-10-13 ENCOUNTER — Telehealth: Payer: Self-pay

## 2019-10-13 NOTE — Telephone Encounter (Signed)
Patient called, left VM to return the call if she did not receive the previous message with the information about where to go for her mammogram.

## 2019-10-13 NOTE — Telephone Encounter (Signed)
Left detailed message for patient advising her to contact [facility] at [phone], the address is [place] [address]

## 2019-10-13 NOTE — Telephone Encounter (Signed)
Copied from [place] [phone]. Topic: General - Other >> Oct 13, 2019 12:19 PM Leward Quan A wrote: Reason for CRM: Patient called to inquire of Lisa Gonzalez where did she tell her to go for her mammogram. Asking for a call back with the name and address of the place Ph# [phone]

## 2019-10-21 ENCOUNTER — Ambulatory Visit
Admission: RE | Admit: 2019-10-21 | Discharge: 2019-10-21 | Disposition: A | Discharge status: Home / Self Care | Payer: 59 | Source: Ambulatory Visit | Attending: Adult Health | Admitting: Adult Health

## 2019-10-21 DIAGNOSIS — Z1231 Encounter for screening mammogram for malignant neoplasm of breast: Secondary | ICD-10-CM | POA: Insufficient documentation

## 2019-10-21 IMAGING — MG DIGITAL SCREENING BILAT W/ TOMO W/ CAD
8 series · 8 of 24 positions shown · non-contrast
Comparison: None
COMPARISON: None

Addendum:
CLINICAL DATA: Screening. New baseline examination.

EXAM:
DIGITAL SCREENING BILATERAL MAMMOGRAM WITH TOMO AND CAD

[L CC synth-2D]
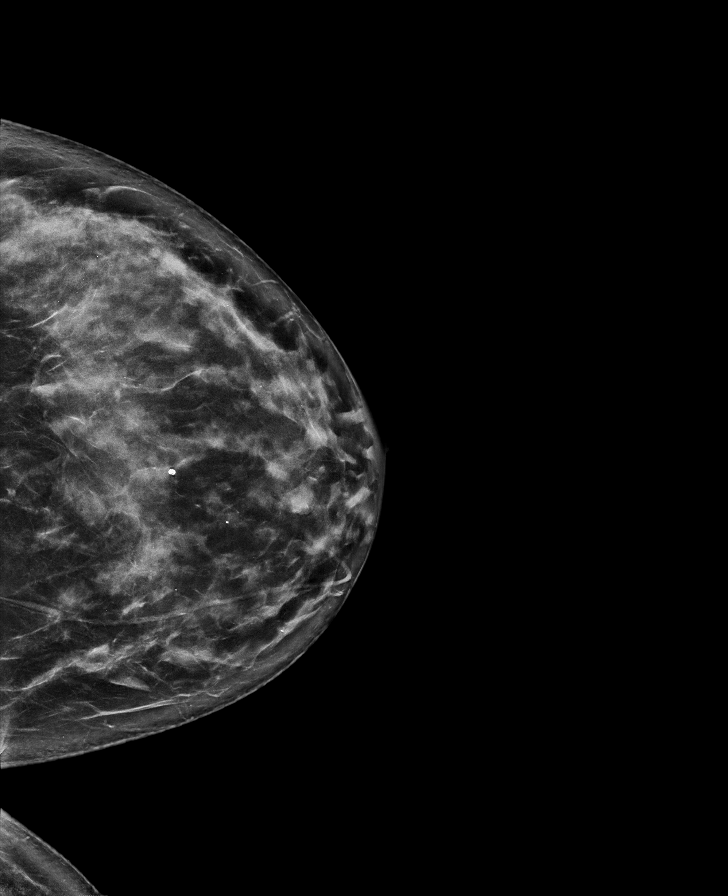

[R MLO synth-2D]
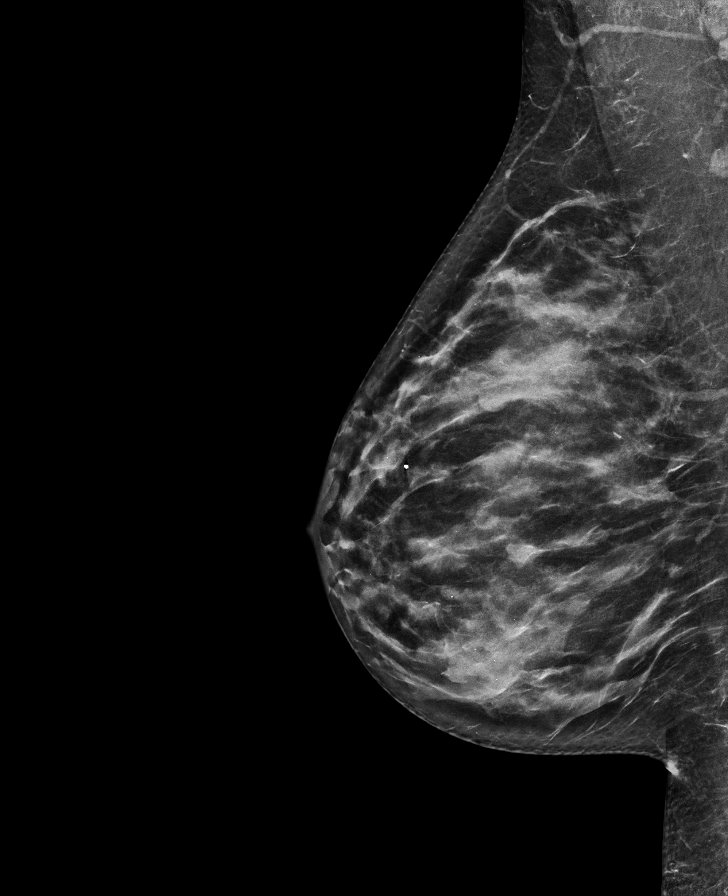

[L MLO synth-2D]
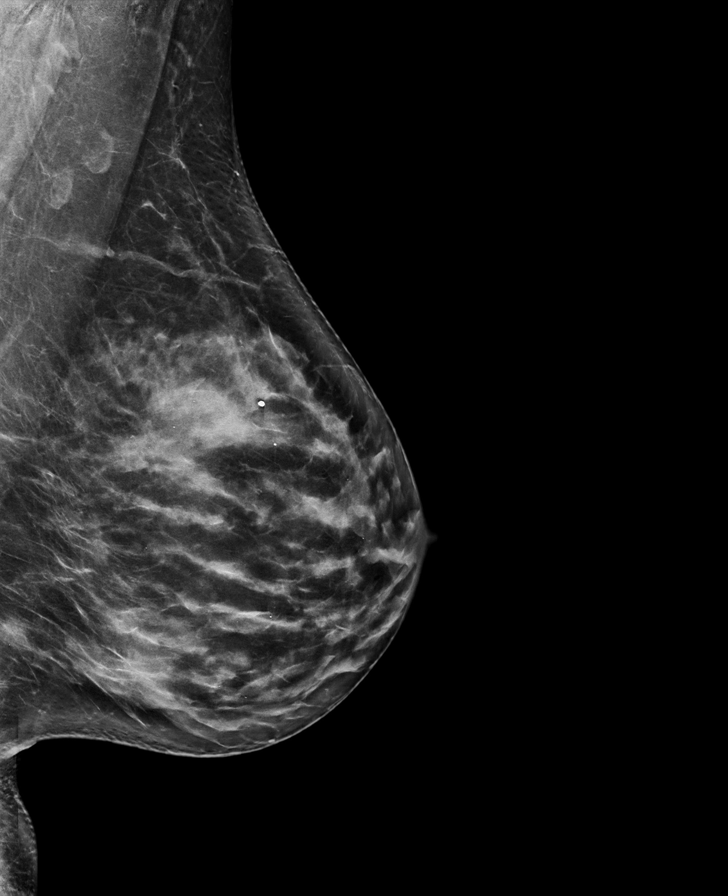

[R CC synth-2D]
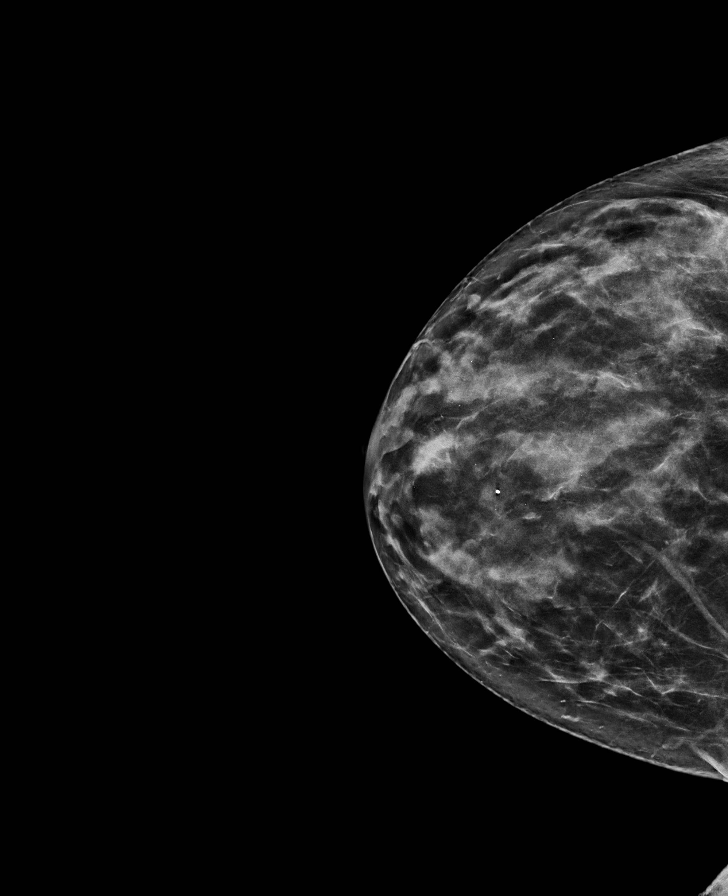

[R CC tomo · tomo slice 35/69.0]
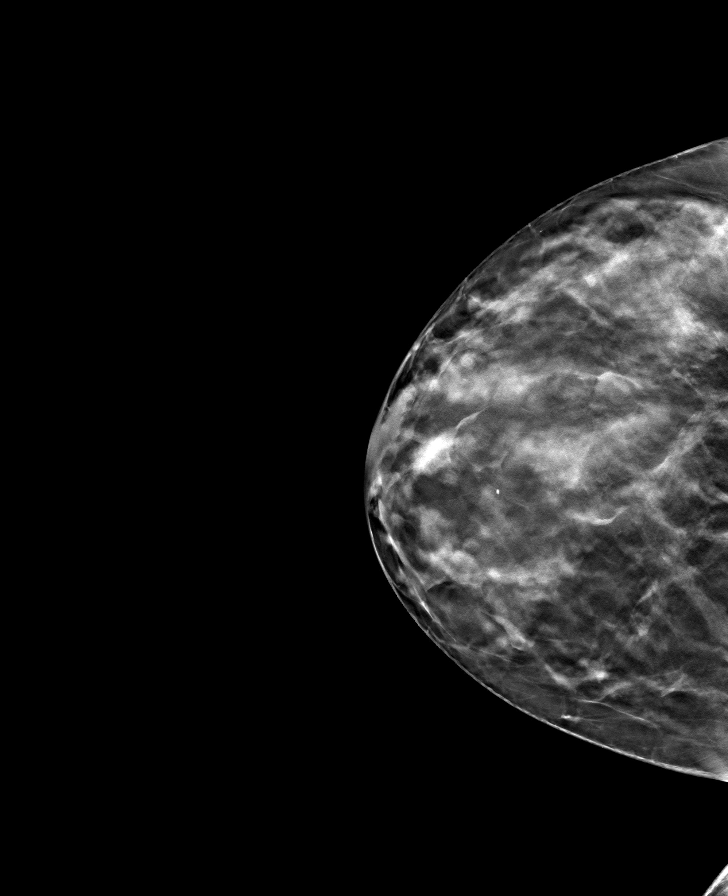

[L CC tomo · tomo slice 39/77.0]
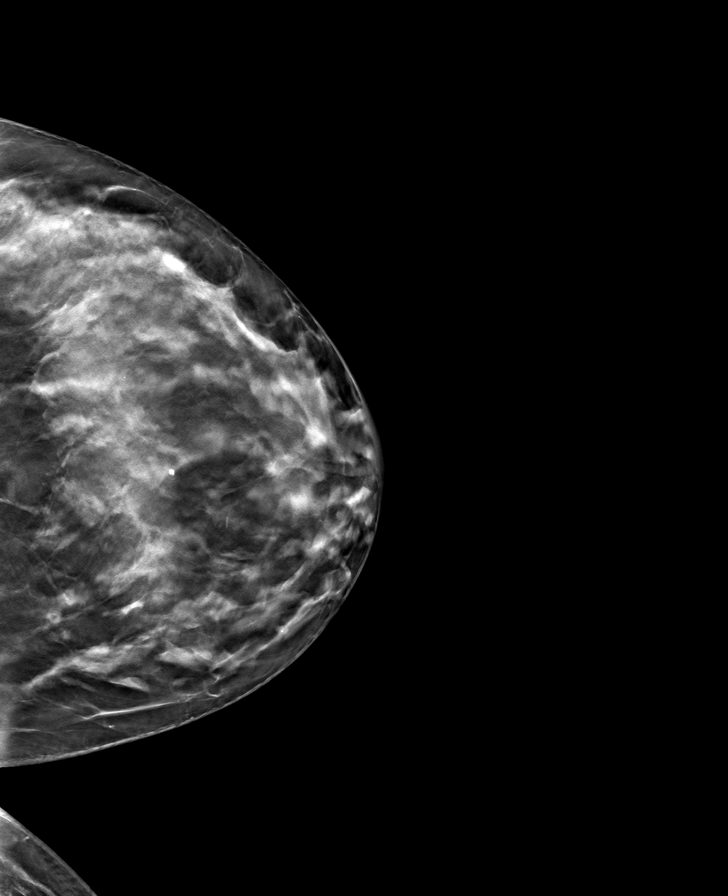

[L MLO tomo · tomo slice 43/84.0]
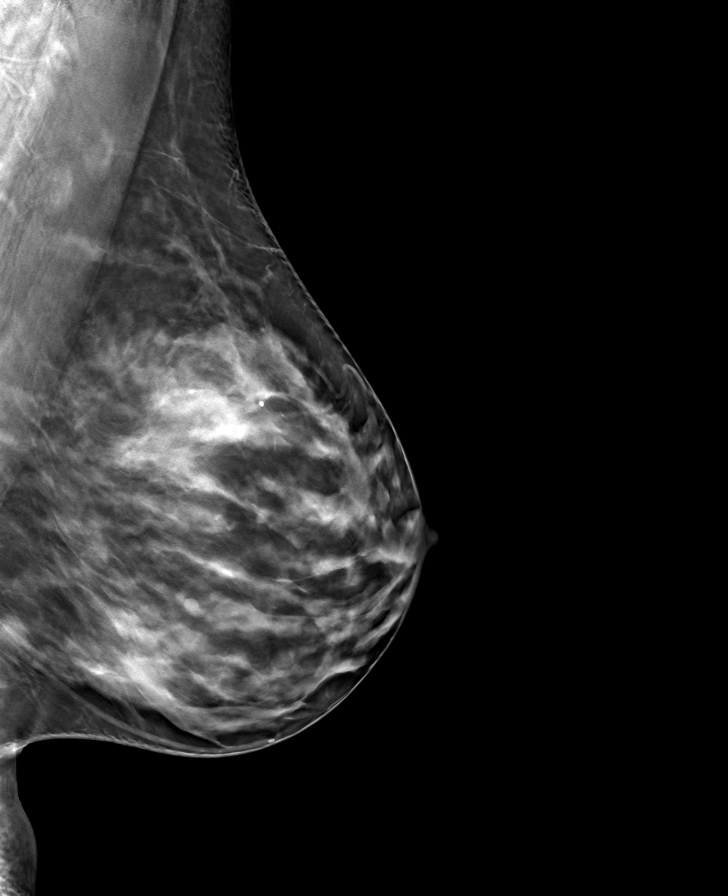

[R MLO tomo · tomo slice 35/69.0]
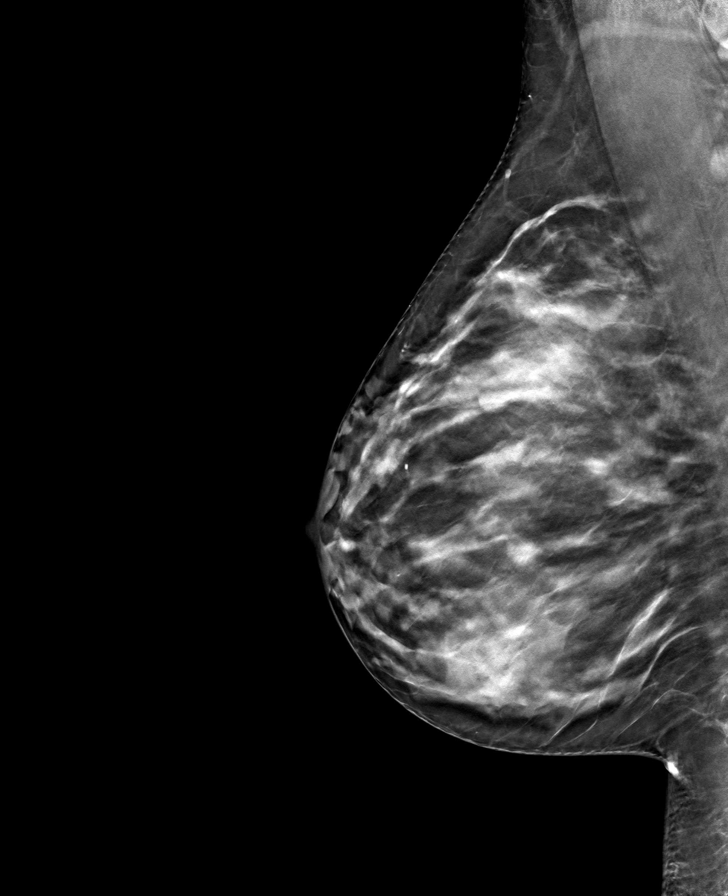

[8 of 24 positions shown; findings below may reference images not displayed]

ACR Breast Density Category c: The breast tissue is heterogeneously
dense, which may obscure small masses.
FINDINGS: In both breast, possible asymmetries require further evaluation.

Images were processed with CAD.
IMPRESSION: Further evaluation is suggested for asymmetries within both breasts.

RECOMMENDATION:
Diagnostic mammogram and possibly ultrasound of both breasts.
(Code:[FH])

The patient will be contacted regarding the findings, and additional
imaging will be scheduled.

BI-RADS CATEGORY  0: Incomplete. Need additional imaging evaluation
and/or prior mammograms for comparison.

ADDENDUM:
Prior outside studies from [facility] [REDACTED] dated [DATE],
[DATE] and [DATE] have now been obtained and compared.

The RIGHT breast asymmetry is unchanged from remote studies and
needs no further evaluation.

The LEFT breast asymmetry on the MLO view should be further
evaluated with diagnostic mammogram and possible ultrasound.

Recommendation: LEFT diagnostic mammogram with possible LEFT breast
ultrasound.

*** End of Addendum ***
ACR Breast Density Category c: The breast tissue is heterogeneously
dense, which may obscure small masses.
FINDINGS: In both breast, possible asymmetries require further evaluation.

Images were processed with CAD.
IMPRESSION: Further evaluation is suggested for asymmetries within both breasts.

RECOMMENDATION:
Diagnostic mammogram and possibly ultrasound of both breasts.
(Code:[FH])

The patient will be contacted regarding the findings, and additional
imaging will be scheduled.

BI-RADS CATEGORY  0: Incomplete. Need additional imaging evaluation
and/or prior mammograms for comparison.

## 2019-11-05 ENCOUNTER — Other Ambulatory Visit: Payer: Self-pay | Admitting: Adult Health

## 2019-11-05 DIAGNOSIS — R928 Other abnormal and inconclusive findings on diagnostic imaging of breast: Secondary | ICD-10-CM

## 2019-11-05 DIAGNOSIS — N6489 Other specified disorders of breast: Secondary | ICD-10-CM

## 2019-11-14 ENCOUNTER — Other Ambulatory Visit: Payer: Self-pay | Admitting: Obstetrics and Gynecology

## 2019-11-14 DIAGNOSIS — Z79890 Hormone replacement therapy (postmenopausal): Secondary | ICD-10-CM

## 2019-11-14 DIAGNOSIS — N951 Menopausal and female climacteric states: Secondary | ICD-10-CM

## 2019-11-18 ENCOUNTER — Telehealth: Payer: Self-pay | Admitting: Adult Health

## 2019-11-18 ENCOUNTER — Other Ambulatory Visit: Payer: Self-pay | Admitting: Obstetrics and Gynecology

## 2019-11-18 ENCOUNTER — Telehealth: Payer: Self-pay

## 2019-11-18 DIAGNOSIS — Z79890 Hormone replacement therapy (postmenopausal): Secondary | ICD-10-CM

## 2019-11-18 DIAGNOSIS — L290 Pruritus ani: Secondary | ICD-10-CM

## 2019-11-18 DIAGNOSIS — N951 Menopausal and female climacteric states: Secondary | ICD-10-CM

## 2019-11-18 NOTE — Telephone Encounter (Signed)
Pt left msg on triage line returning my phone call, called her back, no answer, [place].

## 2019-11-18 NOTE — Telephone Encounter (Signed)
Pt calling; needs refills; unable to refill thru MyChart; some refills haven't bee rx'd by [facility] d/t pt didn't need them at the time.  [phone]

## 2019-11-18 NOTE — Telephone Encounter (Signed)
Called pt back, no answer, LVMTRC.  

## 2019-11-18 NOTE — Telephone Encounter (Signed)
Pt says that this is PCP first time prescribing medication because she is new to provider. pt says that she takes Lunestra 2 MG as needed. Pt would like to have a Rx sent in to her pharmacy.    Pharmacy:  [id number] [address], [place] Phone:  [phone]

## 2019-11-18 NOTE — Telephone Encounter (Signed)
Is pt still progesterone. Is this helping with her symptoms? Had discussed decreasing estradiol patch dose based on sx with prog.  PCP needs to cont to Rx xanax and lunesta.  Thx.

## 2019-11-18 NOTE — Telephone Encounter (Signed)
Pt is not on pregesterone. Says it kept her up at night and gave her nightmares. Thinks she had a bad experience with it but wants to give it another try but hasnt had the "perfect time" to be on it again since it keeps her up. She is taking xanax and lunesta to help her sleep. Says she is having some occasional break through night sweats and hot flashes. Aware she needs to get refills from PCP. Also aware [facility] out of office.

## 2019-11-18 NOTE — Telephone Encounter (Signed)
Called pt, no answer, LVMTRC. 

## 2019-11-18 NOTE — Telephone Encounter (Signed)
Pt would like refills on xanax, lunesta, and estradiol patch.

## 2019-11-18 NOTE — Telephone Encounter (Signed)
Pt returning call.  [phone]

## 2019-11-19 ENCOUNTER — Other Ambulatory Visit: Payer: Self-pay | Admitting: Obstetrics and Gynecology

## 2019-11-19 DIAGNOSIS — Z79890 Hormone replacement therapy (postmenopausal): Secondary | ICD-10-CM

## 2019-11-19 DIAGNOSIS — N951 Menopausal and female climacteric states: Secondary | ICD-10-CM

## 2019-11-19 MED ORDER — ESTRADIOL 0.075 MG/24HR TD PTTW: 1.0000 | MEDICATED_PATCH | TRANSDERMAL | 2 refills | Status: DC

## 2019-11-19 NOTE — Telephone Encounter (Signed)
Called pt, no answer, LVMTRC. 

## 2019-11-19 NOTE — Telephone Encounter (Signed)
It looks like according to the dispense history, this patient has not had this since 2016

## 2019-11-19 NOTE — Progress Notes (Signed)
Rx RF vivelle dot.

## 2019-11-19 NOTE — Telephone Encounter (Signed)
Rx RF vivelle dot eRxd. Doesn't have to do prog, especially if sx mostly controlled. F/u prn.

## 2019-11-22 NOTE — Telephone Encounter (Signed)
Pt aware.

## 2019-11-23 ENCOUNTER — Other Ambulatory Visit: Payer: Self-pay | Admitting: Adult Health

## 2019-11-23 MED ORDER — ESZOPICLONE 2 MG PO TABS: 2.0000 mg | ORAL_TABLET | Freq: Every evening | ORAL | 0 refills | Status: DC | PRN

## 2019-11-23 NOTE — Telephone Encounter (Signed)
Refilled Lunesta.

## 2019-11-26 ENCOUNTER — Ambulatory Visit
Admission: RE | Admit: 2019-11-26 | Discharge: 2019-11-26 | Disposition: A | Discharge status: Home / Self Care | Payer: 59 | Source: Ambulatory Visit | Attending: Adult Health | Admitting: Adult Health

## 2019-11-26 DIAGNOSIS — N6489 Other specified disorders of breast: Secondary | ICD-10-CM | POA: Insufficient documentation

## 2019-11-26 DIAGNOSIS — R928 Other abnormal and inconclusive findings on diagnostic imaging of breast: Secondary | ICD-10-CM | POA: Insufficient documentation

## 2019-11-26 IMAGING — MG MM DIGITAL DIAGNOSTIC UNILAT*L* W/ TOMO W/ CAD
4 series · 4 of 12 positions shown · non-contrast
Comparison: Previous exam(s).

CLINICAL DATA: 59-year-old female presenting as a recall from
screening for possible left breast asymmetry.

EXAM:
DIGITAL DIAGNOSTIC UNILATERAL LEFT MAMMOGRAM WITH TOMO AND CAD

[L ML synth-2D]
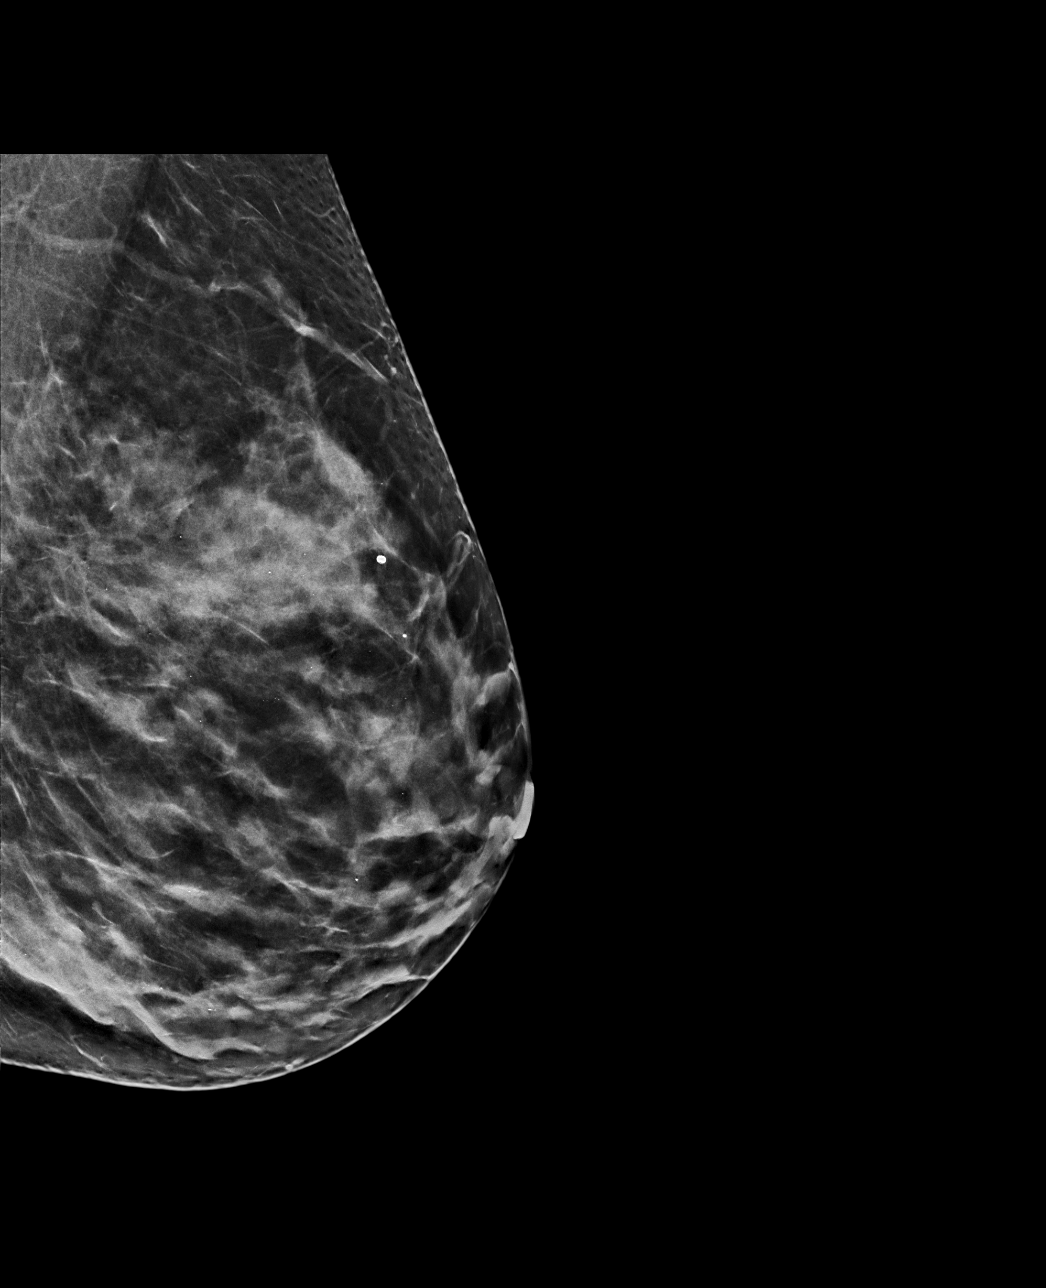

[L MLO synth-2D]
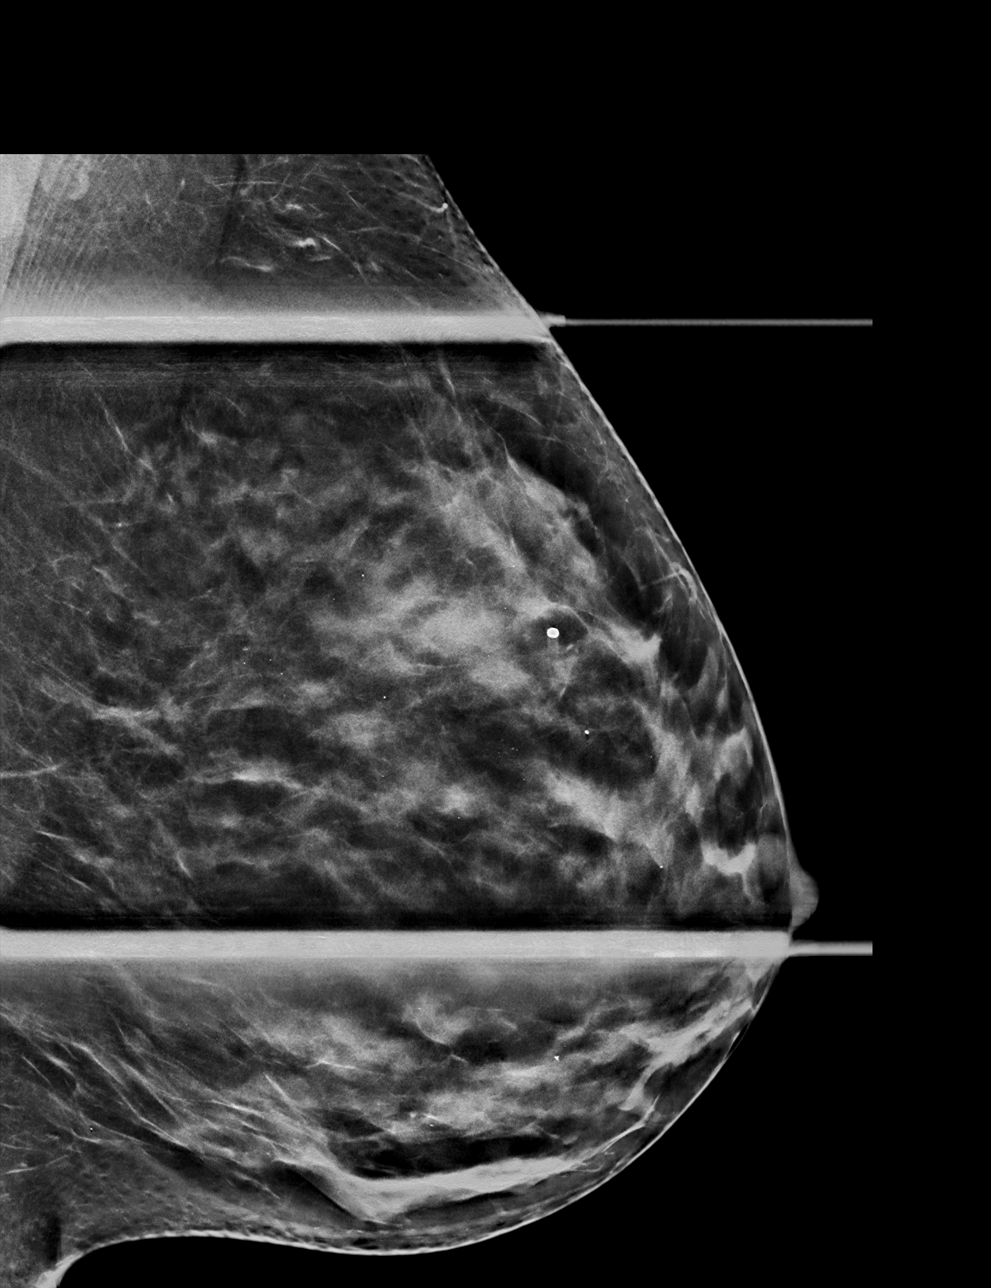

[L MLO tomo · tomo slice 35/70.0]
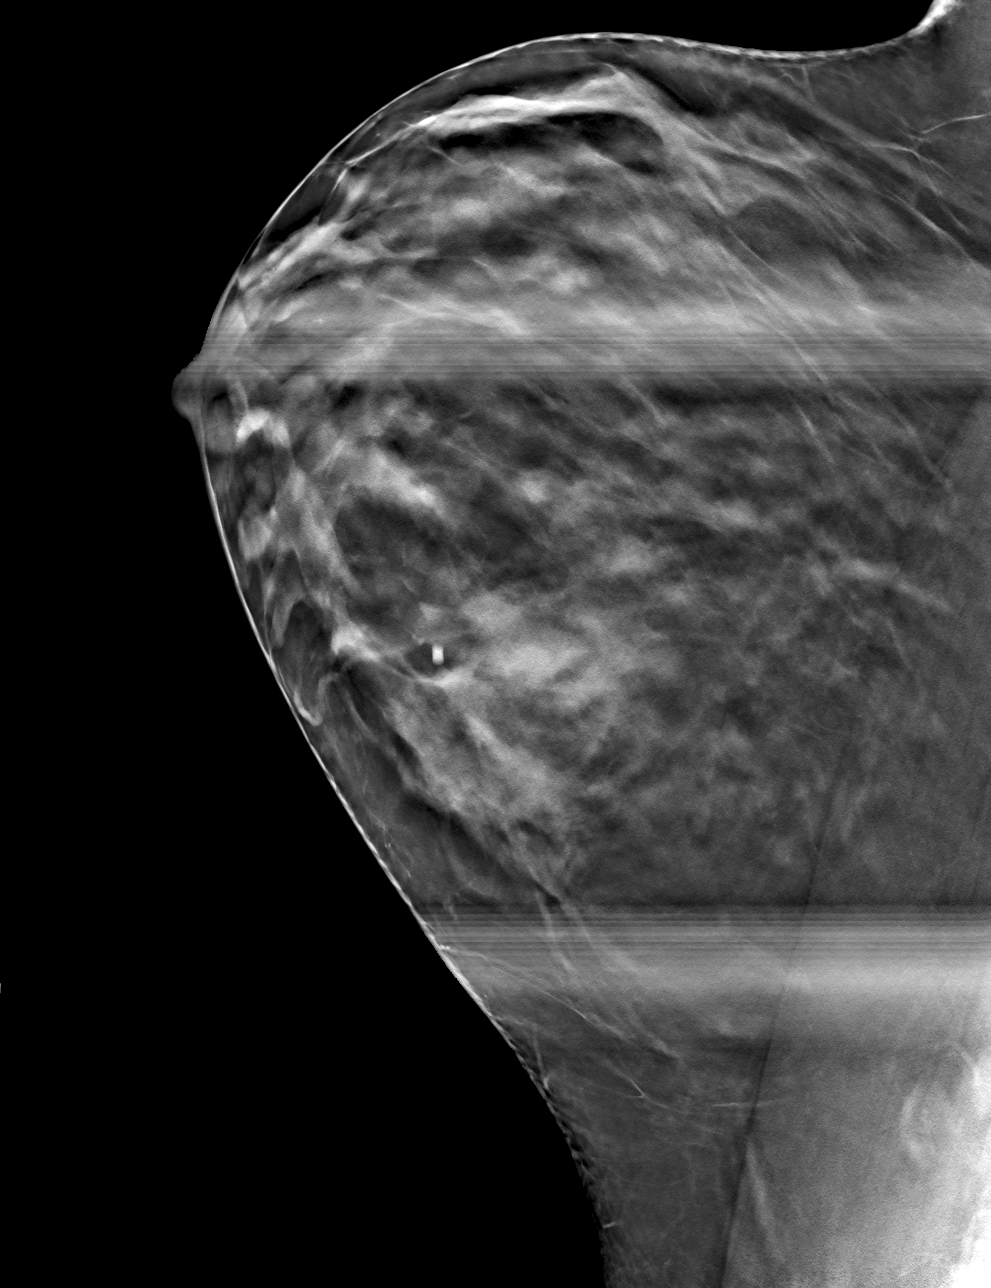

[L ML tomo · tomo slice 35/69.0]
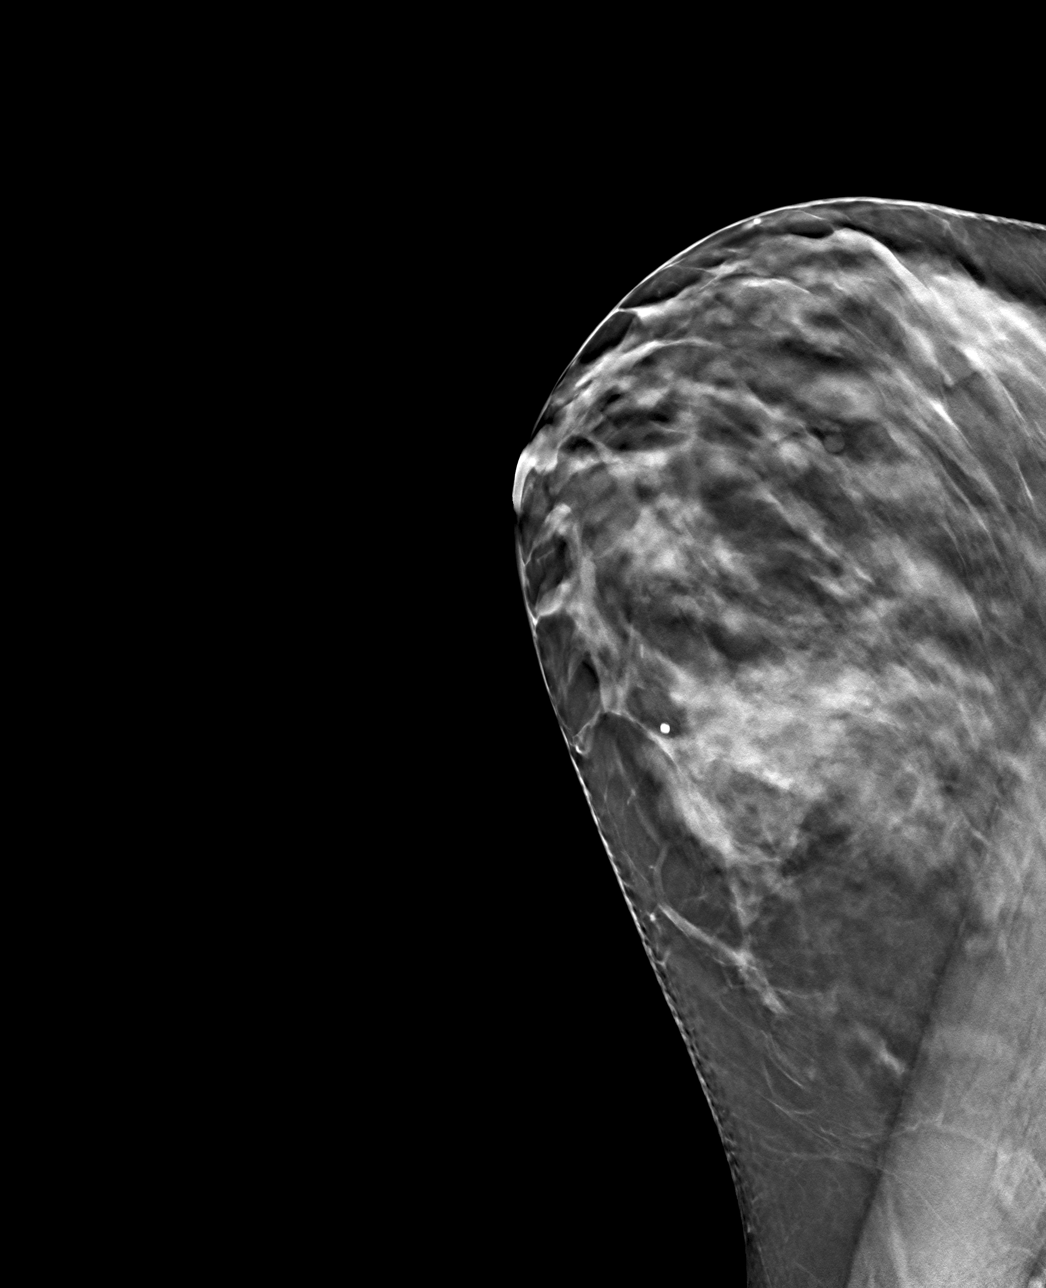

[4 of 12 positions shown; findings below may reference images not displayed]

ACR Breast Density Category c: The breast tissue is heterogeneously
dense, which may obscure small masses.
FINDINGS: Spot compression tomosynthesis and full field mL views of the left
breast performed. The asymmetry questioned on the screening
mammogram in the upper posterior left breast does not persist on
today's imaging. There is no mass or distortion.

Mammographic images were processed with CAD.
IMPRESSION: No mammographic evidence of malignancy in the left breast.

RECOMMENDATION:
Screening mammogram in one year.(Code:[Y5])

I have discussed the findings and recommendations with the patient.
If applicable, a reminder letter will be sent to the patient
regarding the next appointment.

BI-RADS CATEGORY  1: Negative.

## 2019-11-30 NOTE — Progress Notes (Signed)
Mammogram repeat in one year [facility] should have notified her. I did want to be sure her cough resolved after treatment in March as we never had any follow up from that.

## 2019-11-30 NOTE — Progress Notes (Signed)
°   Mammogram repeat in one year [facility] should have notified her. I did want to be sure her cough resolved after treatment in March as we never had any follow up from that.

## 2019-12-01 ENCOUNTER — Encounter: Payer: Self-pay | Admitting: Adult Health

## 2019-12-02 ENCOUNTER — Telehealth: Payer: Self-pay

## 2019-12-02 ENCOUNTER — Other Ambulatory Visit: Payer: Self-pay | Admitting: Adult Health

## 2019-12-02 MED ORDER — ESZOPICLONE 3 MG PO TABS: 3.0000 mg | ORAL_TABLET | Freq: Every evening | ORAL | 1 refills | Status: DC | PRN

## 2019-12-02 NOTE — Telephone Encounter (Signed)
lmtcb okay for [facility] triage to advise .KW 

## 2019-12-02 NOTE — Progress Notes (Signed)
Meds ordered this encounter  Medications  . eszopiclone 3 MG TABS    Sig: Take 1 tablet (3 mg total) by mouth at bedtime as needed for sleep. May try 1/2 tablet first to see effective before progressing to full tablet.    Dispense:  30 tablet    Refill:  1  2mg  tablets were too expensive for patient see Mychart message.  Sent as above with sig as above.

## 2019-12-02 NOTE — Telephone Encounter (Signed)
-----   Message from Doreen Beam, [place] sent at 11/30/2019  3:46 PM EDT -----   Mammogram repeat in one year [facility] should have [facility]. I did want to be sure her cough resolved after treatment in March as we never had any follow up from that.

## 2019-12-03 NOTE — Telephone Encounter (Signed)
Results viewed in [place] on 11/30/19.

## 2019-12-03 NOTE — Telephone Encounter (Signed)
Left message to call for results

## 2020-04-17 ENCOUNTER — Telehealth: Payer: Self-pay

## 2020-04-17 NOTE — Telephone Encounter (Signed)
Pt calling triage stating she has felt a lump in her breast. She had a mammo in July. But if she is feeling a lump, she needs a breast exam appt. Tried to call pt . Please let me know when she calls back or schedule her a breast exam please.

## 2020-04-25 NOTE — Progress Notes (Signed)
Flinchum, Kelby Aline, FNP   Chief Complaint  Patient presents with  . Breast exam    lump on RB, hard and round, no pain x 2 weeks    HPI:      [name] is a 58 y.o. [id number] whose LMP was No LMP recorded. Patient has had a hysterectomy., presents today for RT breast mass noted 2 wks ago. Feels hard, not tender. No erythema, nipple d/c, trauma. No change in size since first noted. Drinks 1 coffee daily. No hx of breast bx. No FH breast/ovar ca. Not on HRT. Hx of dense breasts with u/s imaging done to rule out masses in past, some felt by pt. Neg mammo 11/26/19 after LT breast f/u US.    Past Medical History:  Diagnosis Date  . Allergy   . Anxiety   . Back pain   . Cyst of left breast   . GERD (gastroesophageal reflux disease)   . History of endometriosis   . History of ovarian cyst   . PTSD (post-traumatic stress disorder)     Past Surgical History:  Procedure Laterality Date  . ABDOMINAL HYSTERECTOMY  2019  . SPINAL FUSION  2018  . SPINE SURGERY      Family History  Problem Relation Age of Onset  . Autoimmune disease Mother   . Heart attack Father 47  . Cancer - Colon Maternal Grandmother   . Alzheimer's disease Maternal Grandmother   . Heart attack Maternal Grandfather   . Bone cancer Paternal Grandmother     Social History   Socioeconomic History  . Marital status: Married    Spouse name: Not on file  . Number of children: Not on file  . Years of education: Not on file  . Highest education level: Not on file  Occupational History  . Not on file  Tobacco Use  . Smoking status: Never Smoker  . Smokeless tobacco: Never Used  Vaping Use  . Vaping Use: Never used  Substance and Sexual Activity  . Alcohol use: Yes    Alcohol/week: 1.0 standard drink    Types: 1 Glasses of wine per week  . Drug use: Never  . Sexual activity: Yes    Birth control/protection: None, Surgical    Comment: Hysterectomy  Other Topics Concern  . Not on file    Social History Narrative  . Not on file   Social Determinants of Health   Financial Resource Strain:   . Difficulty of Paying Living Expenses: Not on file  Food Insecurity:   . Worried About Charity fundraiser in the Last Year: Not on file  . Ran Out of Food in the Last Year: Not on file  Transportation Needs:   . Lack of Transportation (Medical): Not on file  . Lack of Transportation (Non-Medical): Not on file  Physical Activity:   . Days of Exercise per Week: Not on file  . Minutes of Exercise per Session: Not on file  Stress:   . Feeling of Stress : Not on file  Social Connections:   . Frequency of Communication with Friends and Family: Not on file  . Frequency of Social Gatherings with Friends and Family: Not on file  . Attends Religious Services: Not on file  . Active Member of Clubs or Organizations: Not on file  . Attends Archivist Meetings: Not on file  . Marital Status: Not on file  Intimate Partner Violence:   . Fear of Current or  Ex-Partner: Not on file  . Emotionally Abused: Not on file  . Physically Abused: Not on file  . Sexually Abused: Not on file    Outpatient Medications Prior to Visit  Medication Sig Dispense Refill  . ALPRAZolam (XANAX) 0.5 MG tablet Take 0.5 mg by mouth 3 (three) times daily.    . Cetirizine HCl 10 MG CAPS Take by mouth.    . clotrimazole-betamethasone (LOTRISONE) cream Apply externally BID for 2 wks 15 g 0  . cyclobenzaprine (FLEXERIL) 5 MG tablet Take 5 mg by mouth 2 (two) times daily.    Marland Kitchen estradiol (VIVELLE-DOT) 0.075 MG/24HR Place 1 patch onto the skin 2 (two) times a week. 24 patch 2  . eszopiclone 3 MG TABS Take 1 tablet (3 mg total) by mouth at bedtime as needed for sleep. May try 1/2 tablet first to see effective before progressing to full tablet. 30 tablet 1  . loratadine (CLARITIN) 10 MG tablet Take by mouth.    . methocarbamol (ROBAXIN) 500 MG tablet Take 1,000 mg by mouth 4 (four) times daily.    Marland Kitchen omeprazole  (PRILOSEC) 40 MG capsule Take 40 mg by mouth 2 (two) times daily.    . SUMAtriptan (IMITREX) 100 MG tablet Take by mouth.    Marland Kitchen albuterol (VENTOLIN HFA) 108 (90 Base) MCG/ACT inhaler Inhale 1-2 puffs into the lungs every 6 (six) hours as needed for wheezing or shortness of breath. 18 g 1  . azithromycin (ZITHROMAX) 250 MG tablet By mouth Take 2 tablets day 1 (500mg  total) and 1 tablet ( 250 mg ) on days 2,3,4,5. 6 tablet 0  . benzonatate (TESSALON) 100 MG capsule 100 mg (one capsule) up to  200 mg(two capsules) by mouth  TID only PRN. May cause drowsiness. 50 capsule 0  . predniSONE (STERAPRED UNI-PAK 21 TAB) 10 MG (21) TBPK tablet PO: Take 6 tablets on day 1:Take 5 tablets day 2:Take 4 tablets day 3: Take 3 tablets day 4:Take 2 tablets day five: 5 Take 1 tablet day 6 21 tablet 0  . progesterone (PROMETRIUM) 100 MG capsule Take 1 cap nightly, 6 nights on, 1 night off 90 capsule 0  . sertraline (ZOLOFT) 50 MG tablet Take 50 mg by mouth daily.     No facility-administered medications prior to visit.      ROS:  Review of Systems  Constitutional: Negative for fever.  Gastrointestinal: Negative for blood in stool, constipation, diarrhea, nausea and vomiting.  Genitourinary: Negative for dyspareunia, dysuria, flank pain, frequency, hematuria, urgency, vaginal bleeding, vaginal discharge and vaginal pain.  Musculoskeletal: Negative for back pain.  Skin: Negative for rash.   BREAST: mass    OBJECTIVE:   Vitals:  BP 120/70   Ht 5\' 8"  (1.727 m)   Wt 158 lb (71.7 kg)   BMI 24.02 kg/m   Physical Exam Vitals reviewed.  Pulmonary:     Effort: Pulmonary effort is normal.  Chest:     Breasts: Breasts are symmetrical.        Right: Mass present. No inverted nipple, nipple discharge, skin change or tenderness.        Left: No inverted nipple, mass, nipple discharge, skin change or tenderness.    Musculoskeletal:        General: Normal range of motion.     Cervical back: Normal range of  motion.  Skin:    General: Skin is warm and dry.  Neurological:     General: No focal deficit present.  Mental Status: She is alert and oriented to person, place, and time.     Cranial Nerves: No cranial nerve deficit.  Psychiatric:        Mood and Affect: Mood normal.        Behavior: Behavior normal.        Thought Content: Thought content normal.        Judgment: Judgment normal.     Assessment/Plan: Breast mass, right - Plan: US BREAST LTD UNI RIGHT INC AXILLA, MM DIAG BREAST TOMO UNI RIGHT; check mammo and u/s. Will f/u with results. If neg, reassurance.      Return if symptoms worsen or fail to improve.  Dalilah Curlin B. George Haggart, PA-C 04/26/2020 8:53 AM

## 2020-04-26 ENCOUNTER — Encounter: Payer: Self-pay | Admitting: Obstetrics and Gynecology

## 2020-04-26 ENCOUNTER — Ambulatory Visit (INDEPENDENT_AMBULATORY_CARE_PROVIDER_SITE_OTHER): Payer: 59 | Admitting: Obstetrics and Gynecology

## 2020-04-26 ENCOUNTER — Other Ambulatory Visit: Payer: Self-pay

## 2020-04-26 VITALS — BP 120/70 | Ht 68.0 in | Wt 158.0 lb

## 2020-04-26 DIAGNOSIS — N6310 Unspecified lump in the right breast, unspecified quadrant: Secondary | ICD-10-CM | POA: Diagnosis not present

## 2020-04-26 NOTE — Patient Instructions (Signed)
I value your feedback and entrusting us with your care. If you get a [facility] patient survey, I would appreciate you taking the time to let us know about your experience today. Thank you!  As of May 06, 2019, your lab results will be released to your MyChart immediately, before I even have a chance to see them. Please give me time to review them and contact you if there are any abnormalities. Thank you for your patience.  

## 2020-05-16 ENCOUNTER — Other Ambulatory Visit: Payer: 59

## 2020-05-18 ENCOUNTER — Ambulatory Visit
Admission: RE | Admit: 2020-05-18 | Discharge: 2020-05-18 | Disposition: A | Discharge status: Home / Self Care | Payer: 59 | Source: Ambulatory Visit | Attending: Obstetrics and Gynecology | Admitting: Obstetrics and Gynecology

## 2020-05-18 ENCOUNTER — Other Ambulatory Visit: Payer: Self-pay

## 2020-05-18 DIAGNOSIS — N6310 Unspecified lump in the right breast, unspecified quadrant: Secondary | ICD-10-CM | POA: Insufficient documentation

## 2020-05-18 IMAGING — MG MM DIGITAL DIAGNOSTIC UNILAT*R* W/ TOMO W/ CAD
6 series · 6 of 18 positions shown · non-contrast
Comparison: Previous exam(s).

CLINICAL DATA: 59-year-old female with new palpable RIGHT breast
mass identified on self-examination.

EXAM:
DIGITAL DIAGNOSTIC RIGHT MAMMOGRAM WITH CAD AND TOMO
ULTRASOUND RIGHT BREAST

[R MLO synth-2D (1 of 2)]
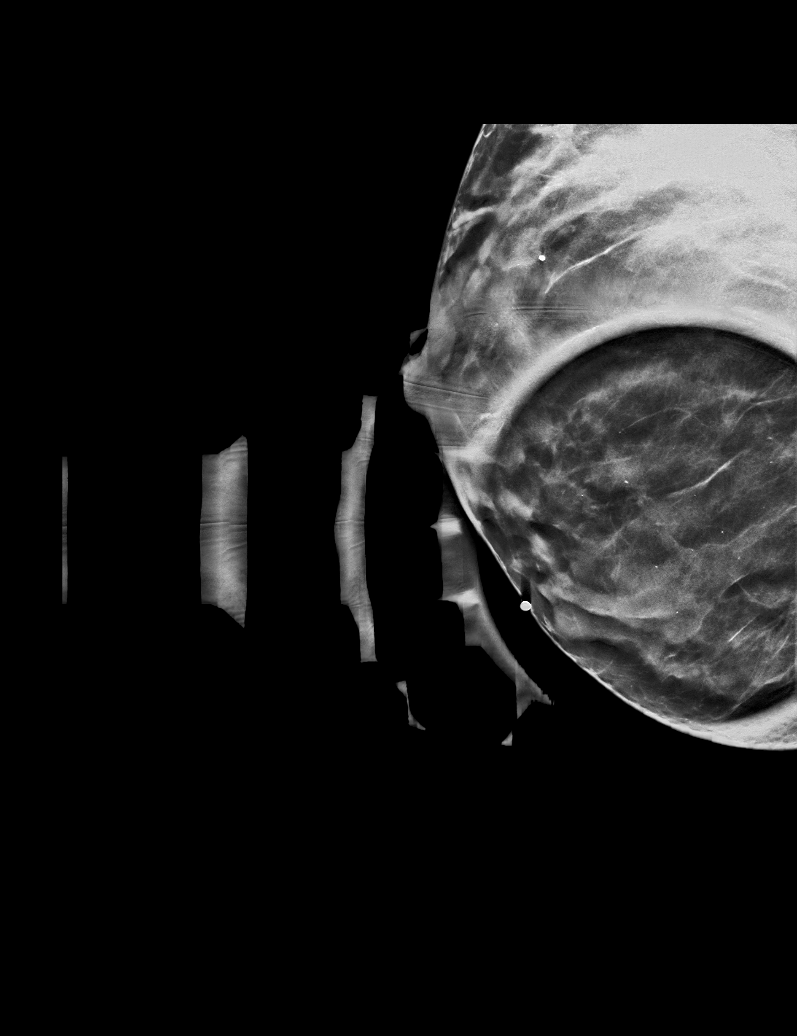

[R MLO synth-2D (2 of 2)]
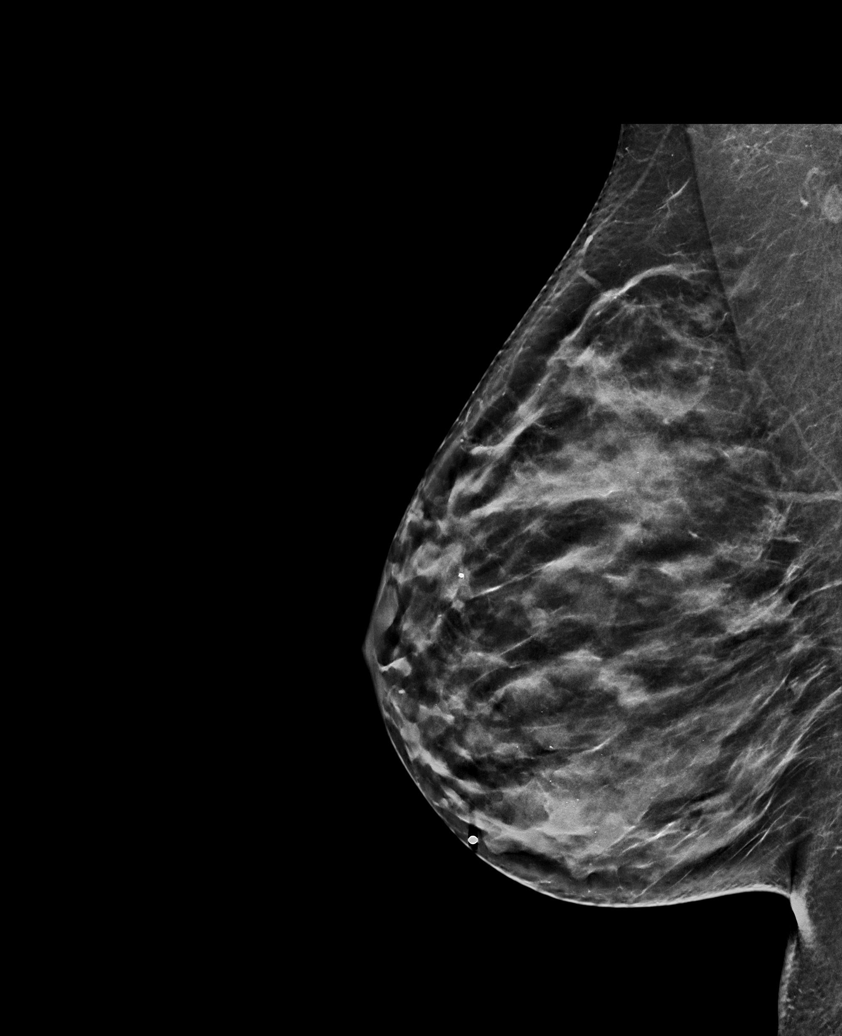

[R CC synth-2D]
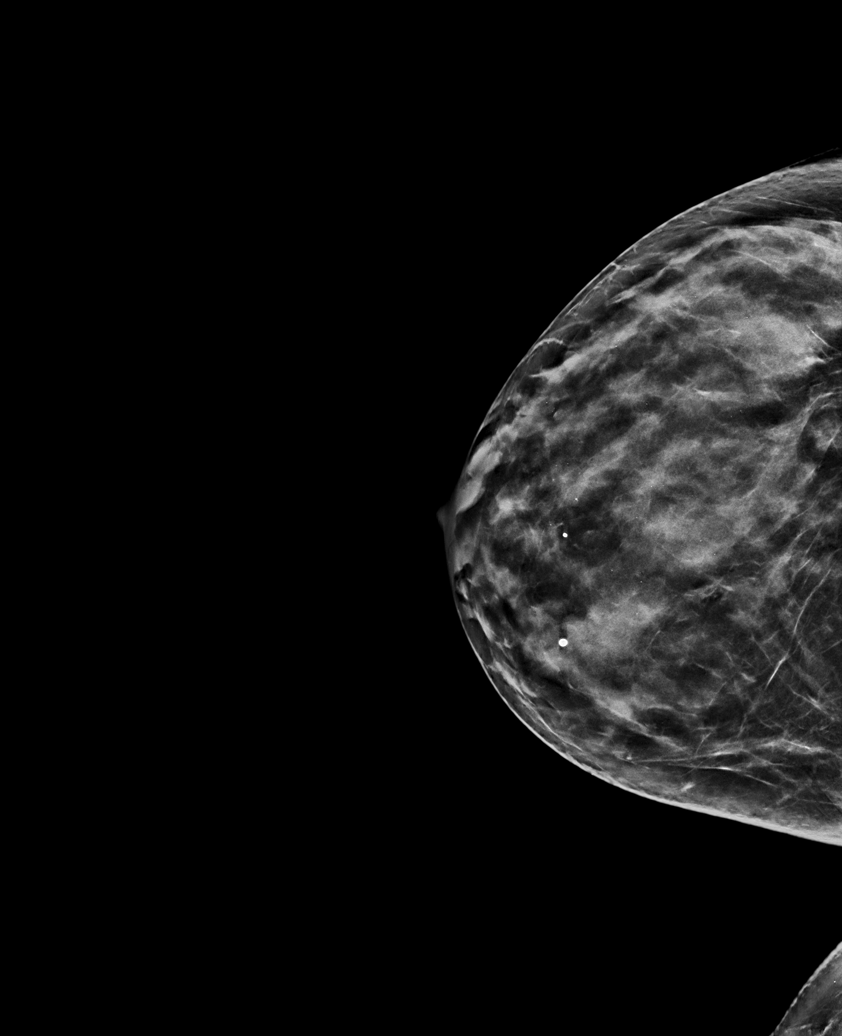

[R CC tomo · tomo slice 32/63.0]
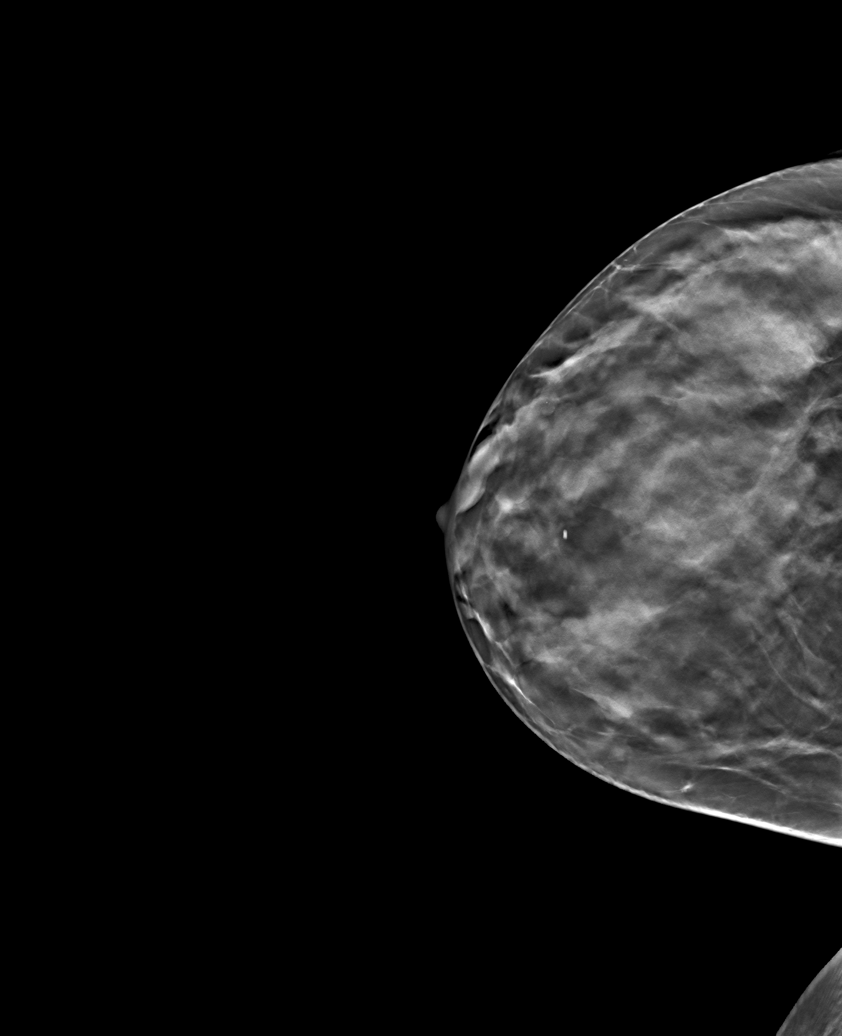

[R MLO tomo (1 of 2) · tomo slice 25/48.0]
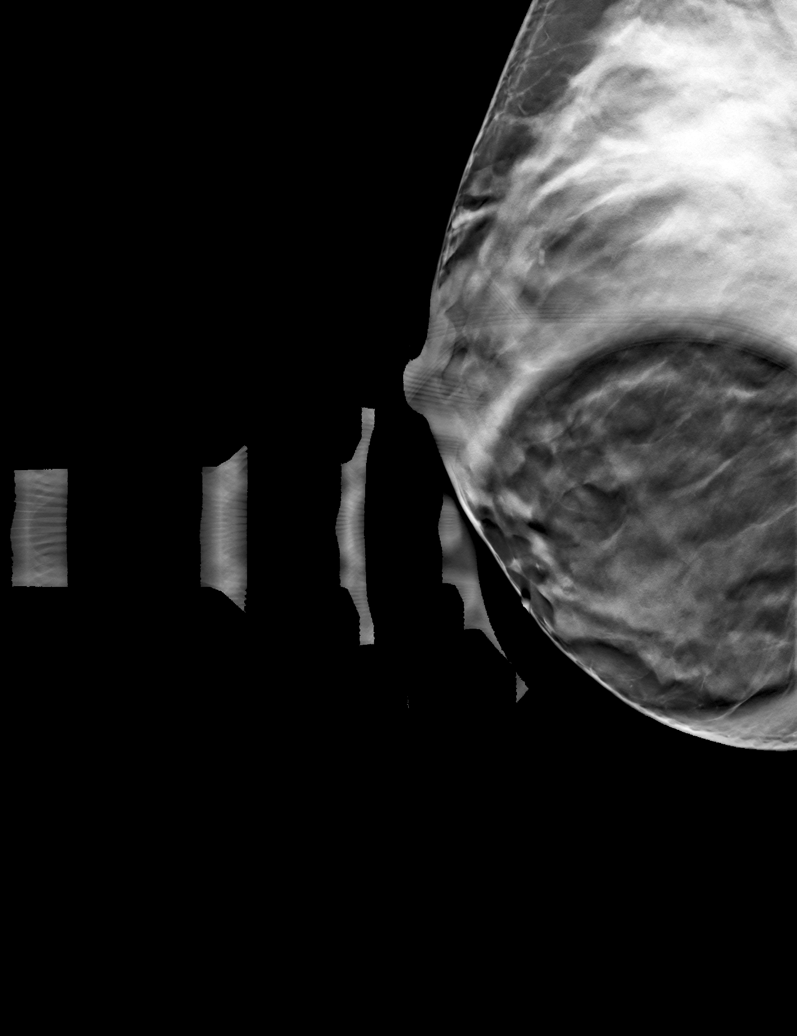

[R MLO tomo (2 of 2) · tomo slice 31/62.0]
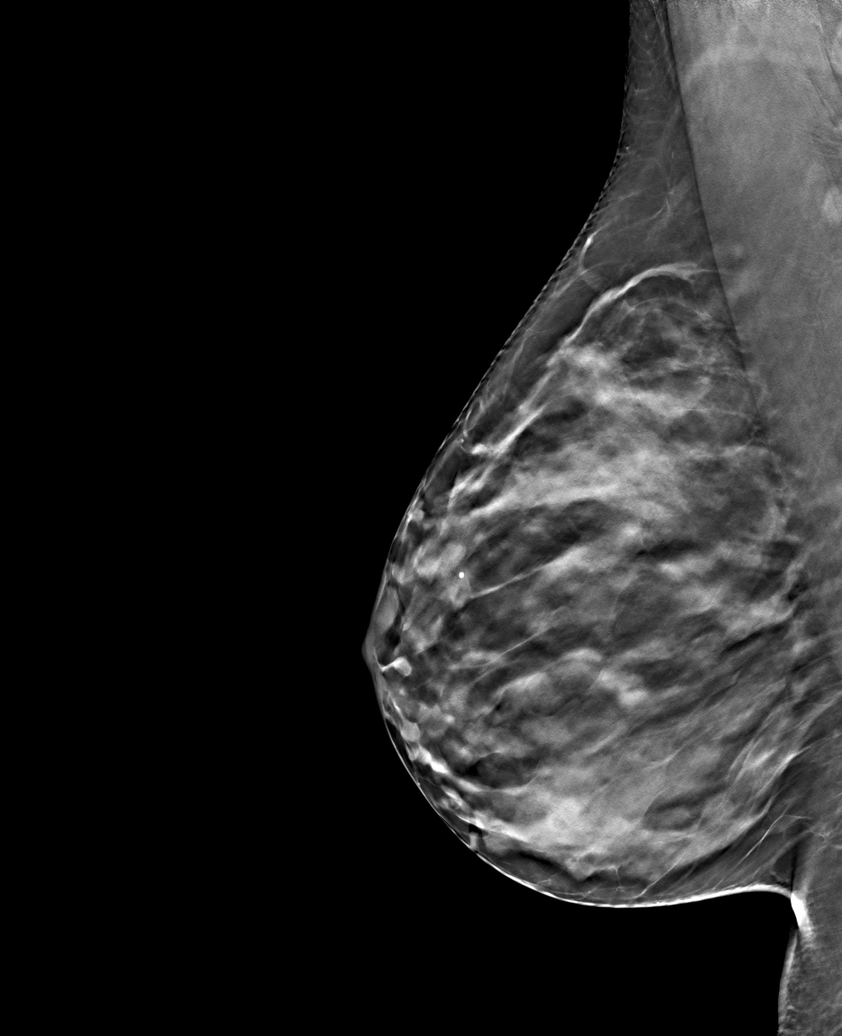

[6 of 18 positions shown; findings below may reference images not displayed]

ACR Breast Density Category c: The breast tissue is heterogeneously
dense, which may obscure small masses.
FINDINGS: 2D/3D full field and spot compression views of the RIGHT breast
demonstrate a circumscribed oval mass within the LOWER RIGHT breast,
corresponding to the BB placed at the site of patient concern.

No other new abnormalities are noted.

Mammographic images were processed with CAD.

On physical exam, a firm palpable mobile mass at the 5 o'clock
position of the RIGHT breast 3 cm from the nipple.

Targeted ultrasound is performed, showing a 1.2 x 0.7 x 0.9 cm
benign complicated cyst at the 5 o'clock position of the RIGHT
breast 3 cm from the nipple, corresponding to the patient's palpable
abnormality.
IMPRESSION: Benign complicated cyst in the LOWER RIGHT breast corresponding to
the patient's palpable abnormality.

RECOMMENDATION:
Bilateral screening mammogram in 6 months to resume annual mammogram
schedule.

I have discussed the findings and recommendations with the patient.
If applicable, a reminder letter will be sent to the patient
regarding the next appointment.

BI-RADS CATEGORY  2: Benign.

## 2020-05-18 IMAGING — US US BREAST*R* LIMITED INC AXILLA
2 series · 8 of 8 positions shown · non-contrast
Comparison: Previous exam(s).

CLINICAL DATA: 59-year-old female with new palpable RIGHT breast
mass identified on self-examination.

EXAM:
DIGITAL DIAGNOSTIC RIGHT MAMMOGRAM WITH CAD AND TOMO
ULTRASOUND RIGHT BREAST

[Series 1: us breast*right* limited inc axilla · 0.05mm/px · 5 of 5 slices shown (1 of 2)]
[im 1/5]
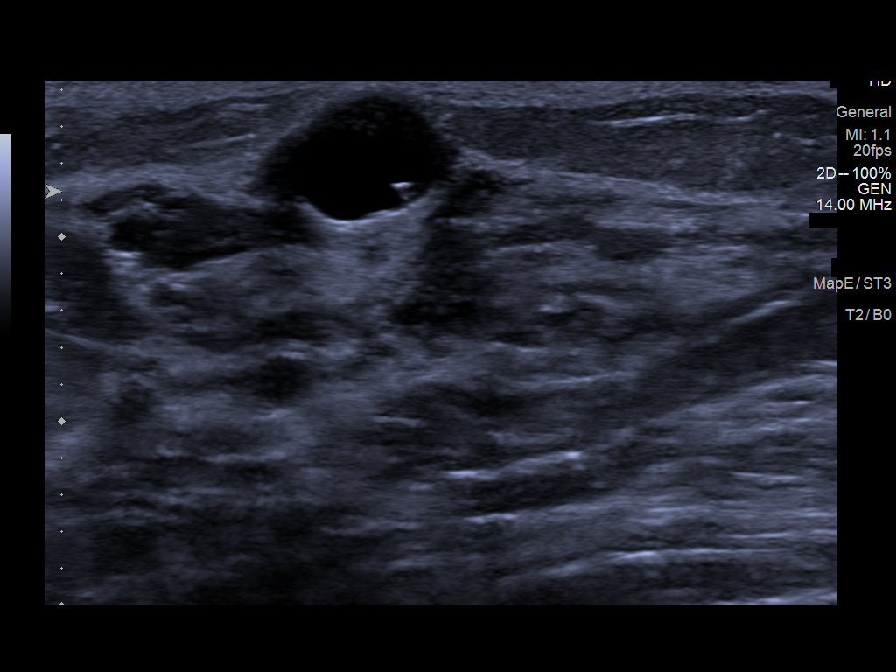
[im 2/5]
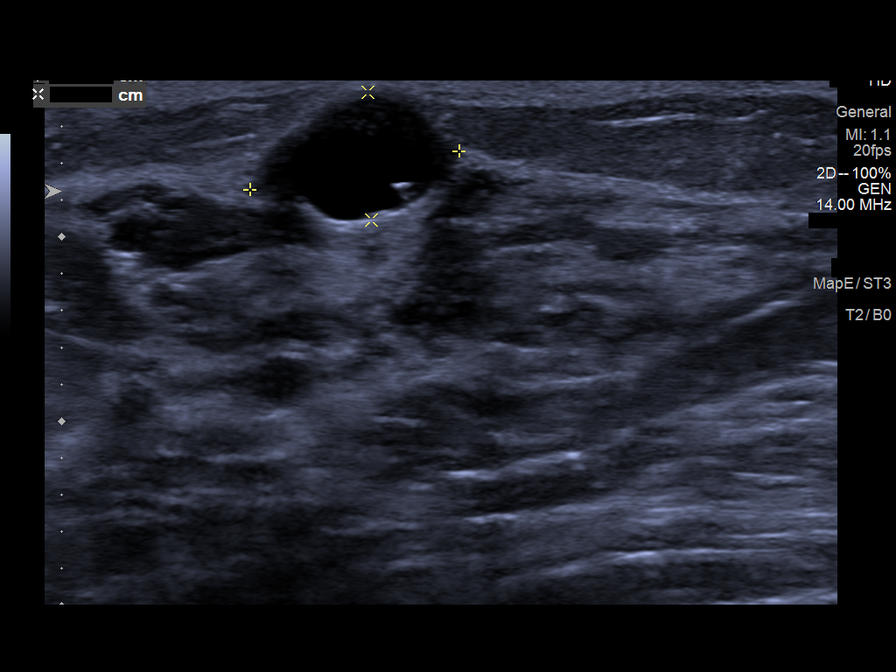
[im 3/5]
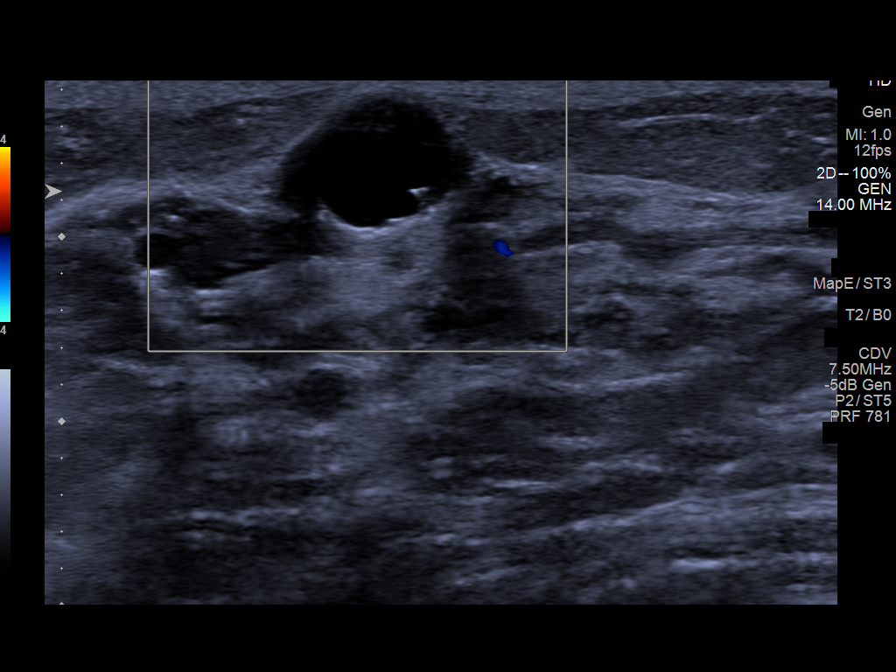
[im 4/5]
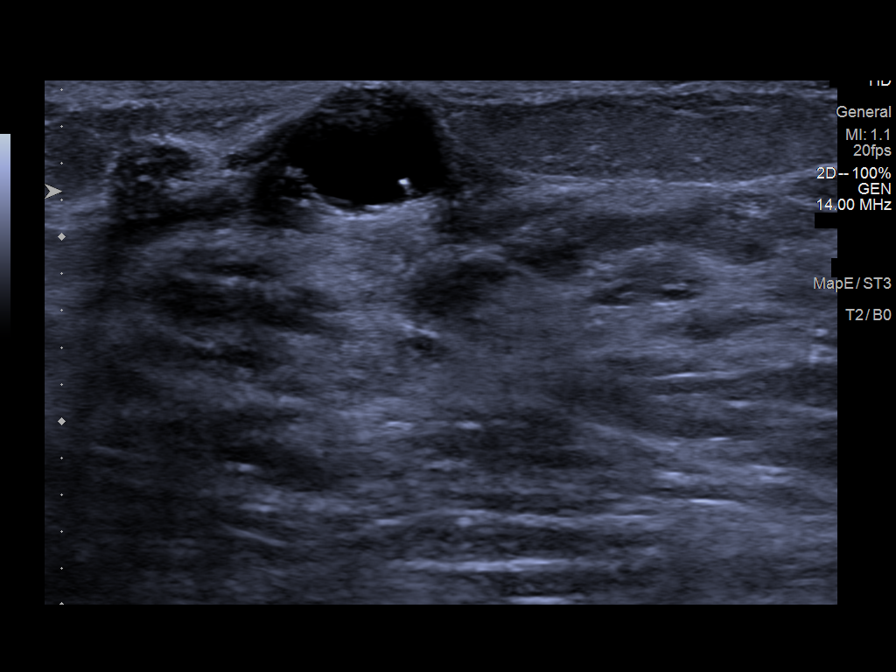
[im 5/5]
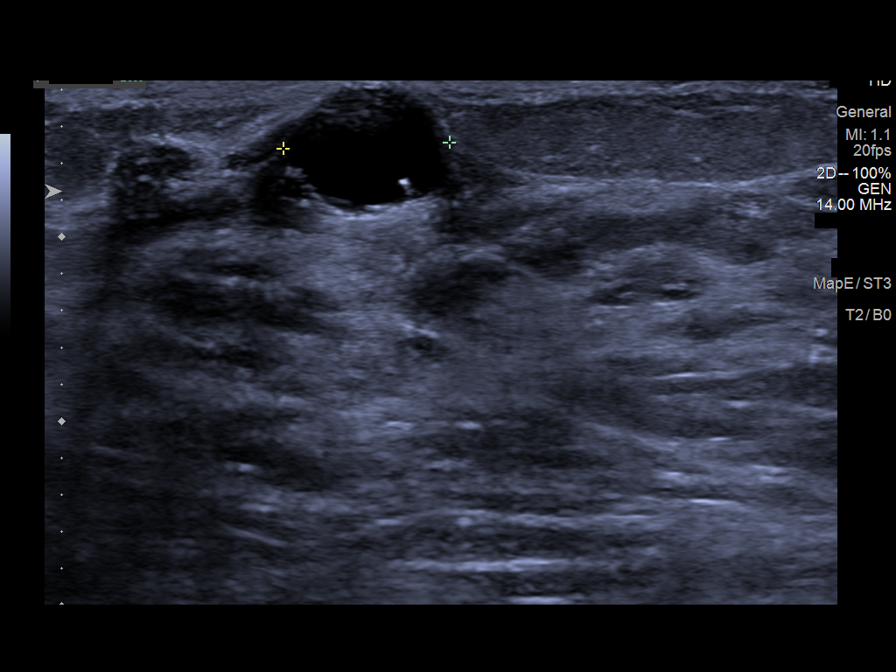

[Series 2: us breast*right* limited inc axilla · 0.04mm/px · 3 of 3 slices shown (2 of 2)]
[im 1/3]
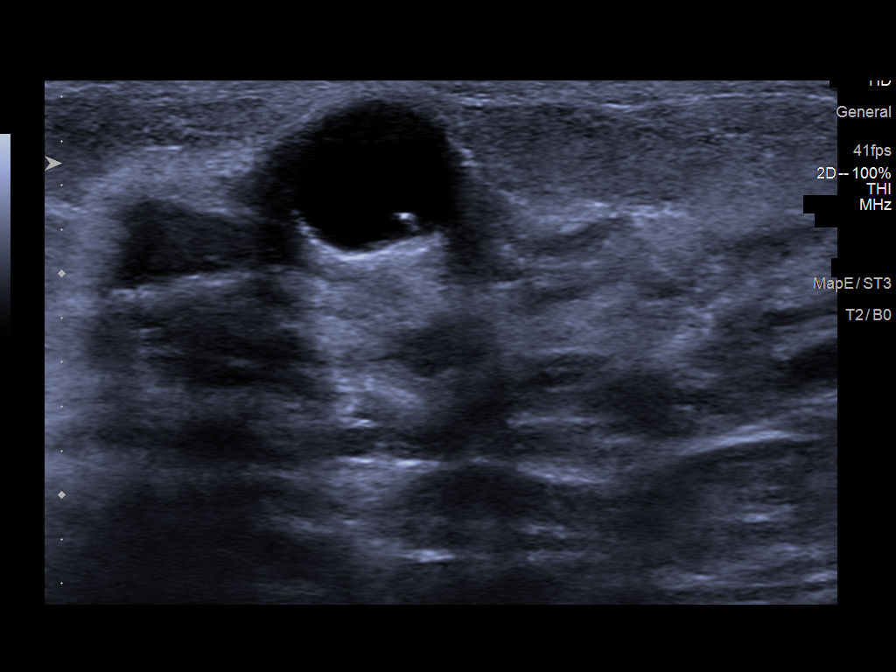
[im 2/3]
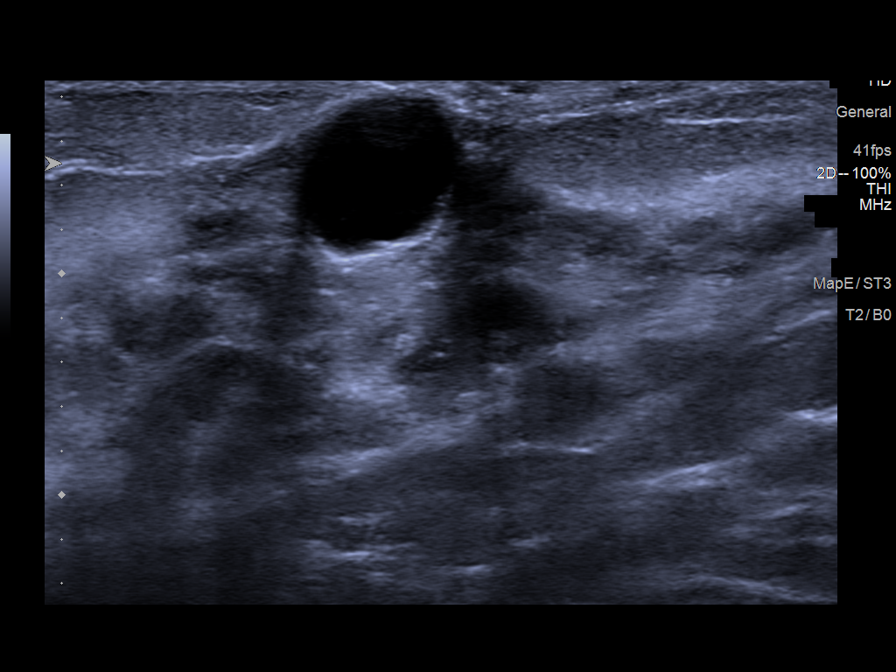
[im 3/3]
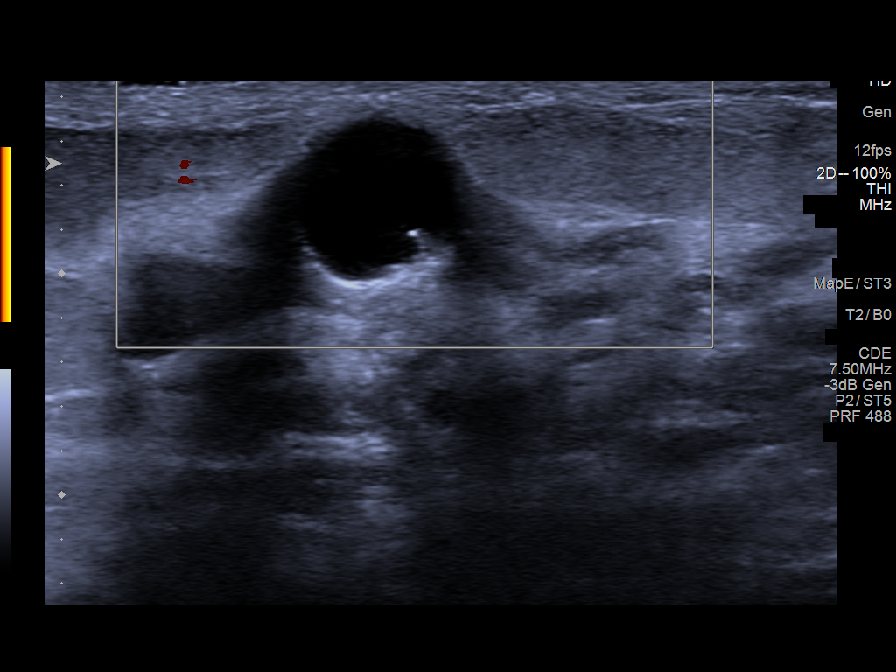

[8 of 8 positions shown; findings below may reference images not displayed]

ACR Breast Density Category c: The breast tissue is heterogeneously
dense, which may obscure small masses.
FINDINGS: 2D/3D full field and spot compression views of the RIGHT breast
demonstrate a circumscribed oval mass within the LOWER RIGHT breast,
corresponding to the BB placed at the site of patient concern.

No other new abnormalities are noted.

Mammographic images were processed with CAD.

On physical exam, a firm palpable mobile mass at the 5 o'clock
position of the RIGHT breast 3 cm from the nipple.

Targeted ultrasound is performed, showing a 1.2 x 0.7 x 0.9 cm
benign complicated cyst at the 5 o'clock position of the RIGHT
breast 3 cm from the nipple, corresponding to the patient's palpable
abnormality.
IMPRESSION: Benign complicated cyst in the LOWER RIGHT breast corresponding to
the patient's palpable abnormality.

RECOMMENDATION:
Bilateral screening mammogram in 6 months to resume annual mammogram
schedule.

I have discussed the findings and recommendations with the patient.
If applicable, a reminder letter will be sent to the patient
regarding the next appointment.

BI-RADS CATEGORY  2: Benign.

## 2020-05-22 ENCOUNTER — Encounter: Payer: Self-pay | Admitting: Obstetrics and Gynecology

## 2020-06-14 ENCOUNTER — Ambulatory Visit (INDEPENDENT_AMBULATORY_CARE_PROVIDER_SITE_OTHER): Payer: 59 | Admitting: Physician Assistant

## 2020-06-14 ENCOUNTER — Other Ambulatory Visit: Payer: Self-pay

## 2020-06-14 ENCOUNTER — Encounter: Payer: Self-pay | Admitting: Physician Assistant

## 2020-06-14 VITALS — BP 119/78 | HR 74 | Temp 97.7°F | Wt 160.0 lb

## 2020-06-14 DIAGNOSIS — M7062 Trochanteric bursitis, left hip: Secondary | ICD-10-CM

## 2020-06-14 DIAGNOSIS — M7072 Other bursitis of hip, left hip: Secondary | ICD-10-CM

## 2020-06-14 DIAGNOSIS — M7061 Trochanteric bursitis, right hip: Secondary | ICD-10-CM | POA: Diagnosis not present

## 2020-06-14 DIAGNOSIS — M7071 Other bursitis of hip, right hip: Secondary | ICD-10-CM

## 2020-06-14 DIAGNOSIS — M7630 Iliotibial band syndrome, unspecified leg: Secondary | ICD-10-CM | POA: Diagnosis not present

## 2020-06-14 DIAGNOSIS — M76899 Other specified enthesopathies of unspecified lower limb, excluding foot: Secondary | ICD-10-CM

## 2020-06-14 MED ORDER — METHYLPREDNISOLONE 4 MG PO TBPK: ORAL_TABLET | ORAL | 0 refills | Status: DC

## 2020-06-14 NOTE — Patient Instructions (Signed)
Hip Bursitis  Hip bursitis is swelling of one or more fluid-filled sacs (bursae) in your hip joint. This condition can cause pain, and your symptoms may come and go over time. What are the causes?  Repeated use of your hip muscles.  Injury to the hip.  Weak butt muscles.  Bone spurs.  Infection. In some cases, the cause may not be known. What increases the risk? You are more likely to develop this condition if:  You had a past hip injury or hip surgery.  You have a condition, such as arthritis, gout, diabetes, or thyroid disease.  You have spine problems.  You have one leg that is shorter than the other.  You run a lot or do long-distance running.  You play sports where there is a risk of injury or falling, such as football, martial arts, or skiing. What are the signs or symptoms? Symptoms may come and go, and they often include:  Pain in the hip or groin area. Pain may get worse when you move your hip.  Tenderness and swelling of the hip. In rare cases, the bursa may become infected. If this happens, you may get a fever, as well as have warmth and redness in the hip area. How is this treated? This condition is treated by:  Resting your hip.  Icing your hip.  Wrapping the hip area with an elastic bandage (compression wrap).  Keeping the hip raised. Other treatments may include medicine, draining fluid out of the bursa, or using crutches, a cane, or a walker. Surgery may be needed, but this is rare. Long-term treatment may include doing exercises to help your strength and flexibility. It may also include lifestyle changes like losing weight to lessen the strain on your hip. Follow these instructions at home: Managing pain, stiffness, and swelling  If told, put ice on the painful area. ? Put ice in a plastic bag. ? Place a towel between your skin and the bag. ? Leave the ice on for 20 minutes, 2-3 times a day.  Raise your hip by putting a pillow under your hips  while you lie down. Stop if you feel pain.  If told, put heat on the affected area. Do this as often as told by your doctor. Use a moist heat pack or a heating pad as told by your doctor. ? Place a towel between your skin and the heat source. ? Leave the heat on for 20-30 minutes. ? Take off the heat if your skin turns bright red. This is very important if you are unable to feel pain, heat, or cold. You may have a greater risk of getting burned.      Activity  Do not use your hip to support your body weight until your doctor says that you can.  Use crutches, a cane, or a walker as told by your doctor.  If the affected leg is one that you use to drive, ask your doctor if it is safe to drive.  Rest and protect your hip as much as you can until you feel better.  Return to your normal activities as told by your doctor. Ask your doctor what activities are safe for you.  Do exercises as told by your doctor. General instructions  Take over-the-counter and prescription medicines only as told by your doctor.  Gently rub and stretch your injured area as often as is comfortable.  Wear elastic bandages only as told by your doctor.  If one of your legs is  shorter than the other, get fitted for a shoe insert or orthotic.  Keep a healthy weight. Follow instructions from your doctor.  Keep all follow-up visits as told by your doctor. This is important. How is this prevented?  Exercise regularly, as told by your doctor.  Wear the right shoes for the sport you play.  Warm up and stretch before being active. Cool down and stretch after being active.  Take breaks often from repeated activity.  Avoid activities that bother your hip or cause pain.  Avoid sitting down for a long time. Where to find more information  [employer]: orthoinfo.aaos.org Contact a doctor if:  You have a fever.  You have new symptoms.  You have trouble walking or doing everyday  activities.  You have pain that gets worse or does not get better with medicine.  Your skin around your hip is red.  You get a feeling of warmth in your hip area. Get help right away if:  You cannot move your hip.  You have very bad pain.  You cannot control the muscles in your feet. Summary  Hip bursitis is swelling of one or more fluid-filled sacs (bursae) in your hip joint.  Symptoms often come and go over time.  This condition is often treated by resting and icing the hip. It also may help to keep the area raised and wrapped in an elastic bandage. Other treatments may be needed. This information is not intended to replace advice given to you by your health care provider. Make sure you discuss any questions you have with your health care provider. Document Revised: 03/15/2019 Document Reviewed: 01/19/2018 Elsevier Patient Education  2021 [place]. Gluteus Medius Syndrome  Gluteus medius syndrome causes pain on the outside of your hip due to repeated overstretching or overworking of the gluteus medius muscle. This muscle runs from the top, outer part of your pelvis to the top of your thigh bone (femur). This muscle helps you lift your leg to the side and rotate your leg. It also keeps your hip stable and aligned when you are standing, walking, or running. What are the causes? This condition is caused by small injuries to the gluteus medius muscle over time.  It happens from repetitive movements or a sudden increase in the amount or intensity of activity that involves the legs.  It starts with muscle inflammation and may lead to small tears and scarring in your muscle. What increases the risk? You are more likely to develop this condition if you:  Run on soft or uneven surfaces, such as sand or grass.  Have weakness in your hips and core muscles.  Run long distances.  Increase your time, distance, or intensity of a sport over a short period of time. What are the signs  or symptoms? The main symptom of this condition is pain on the outside of your hip with activity.  Usually, the pain will: ? Increase the longer you play sports or run. ? Decrease with rest.  Your hip may also be tender to the touch and you may have muscle spasms. How is this diagnosed? This condition is diagnosed based on your symptoms, medical history, and physical exam.  During the exam, your health care provider will touch different areas of your hip to test for pain.  You may also have imaging studies, such as X-rays and MRI, to rule out other causes of pain. How is this treated? This condition may be treated by a combination  of treatments:  Decreasing mileage or time spent doing sports.  Having a coach help you with your running form.  Stretching or strengthening exercises (physical therapy).  Ice or heat therapy.  Massage.  Local electrical stimulation (transcutaneous electrical nerve stimulation, TENS).  Trigger point injection. A steroid or numbing medicine is injected in the area where you have pain. Follow these instructions at home: Managing pain, stiffness, and swelling  If directed, put ice on the injured area. ? Put ice in a plastic bag. ? Place a towel between your skin and the bag. ? Leave the ice on for 20 minutes, 2-3 times a day.  If directed, apply heat to the affected area before you exercise. Use the heat source that your health care provider recommends, such as a moist heat pack or a heating pad. ? Place a towel between your skin and the heat source. ? Leave the heat on for 20-30 minutes. ? Remove the heat if your skin turns bright red. This is especially important if you are unable to feel pain, heat, or cold. You may have a greater risk of getting burned.      Activity  Return to your normal activities as told by your health care provider. Ask your health care provider what activities are safe for you.  Do exercises as told by your physical  therapist.  Try not to lie on your painful side. When lying on your other side, put a pillow between your knees to decrease strain on your top hip muscles. General instructions  Take over-the-counter and prescription medicines only as told by your health care provider.  Keep all follow-up visits as told by your health care provider. This is important. How is this prevented?  Warm up and stretch before being active.  Cool down and stretch after being active.  Give your body time to rest between periods of activity.  Include a variety of exercises and activities in your routine to avoid overuse injuries.  Maintain physical fitness, including: ? Strength. ? Flexibility. ? Cardiovascular fitness. ? Endurance. Contact a health care provider if:  Your pain does not get better or it gets worse. Summary  Gluteus medius syndrome causes pain on the outside of your hip due to repeated overstretching or overworking of the gluteus medius muscle.  This condition is caused by small injuries to the gluteus medius muscle over time.  Treatments may include physical therapy, massage, and trigger point injection. This information is not intended to replace advice given to you by your health care provider. Make sure you discuss any questions you have with your health care provider. Document Revised: 09/03/2018 Document Reviewed: 10/13/2017 Elsevier Patient Education  2021 [place]. Iliotibial Band Syndrome  Iliotibial band syndrome is a condition that often causes knee pain. It can also cause pain in the outside of the hip, thigh, and knee. The iliotibial band is a strip of tissue in each of the legs. This band runs from the outside of the hip and down the thigh to the outside of the knee. Repeatedly bending and straightening your knee can irritate your iliotibial band. What are the causes? This condition is caused by inflammation from rubbing (friction) of the iliotibial band as it moves  over the thigh bone (femur) when you bend and straighten your knee again and again. What increases the risk? You are more likely to develop this condition if:  You change elevation often while running on a treadmill.  You run very long distances.  You recently increased the length or intensity of your workouts. Intensity means how much effort you put in.  You run downhill often, or you just started running downhill.  You ride a bike very far or often. You may also be at greater risk if:  You start a new workout routine without first warming up your muscles.  You have a job that requires you to bend, squat, or climb often. What are the signs or symptoms? Symptoms of this condition include:  Pain along the outside of your knee that may be worse with activity, especially running or going up and down stairs.  A feeling like a snap over your knee or hip.  Swelling on the outside of your knee.  Pain or a feeling of tightness in your hip. How is this diagnosed? This condition is diagnosed based on:  Your symptoms.  Your medical history.  A physical exam. You may also see a health care provider who specializes in reducing pain and improving movement (physical therapist). A physical therapist may do an exam to check your balance, movement, and way of walking or running (gait) to see whether the way you move could add to your injury. You may also have tests to measure your strength, flexibility, and range of motion. How is this treated? This condition may be treated by:  Taking NSAIDs, such as ibuprofen, to help relieve pain and swelling.  Resting and limiting exercise.  Returning to activities gradually.  Doing exercises to improve movement and strength (physical therapy) as told by your health care provider.  Having an injection of steroid medicine. This is medicine that helps relieve inflammation.  Having surgery. This may be done if your symptoms do not improve after other  treatments. Follow these instructions at home: Managing pain, stiffness, and swelling If directed, put ice on the injured area. To do this:  Put ice in a plastic bag.  Place a towel between your skin and the bag.  Leave the ice on for 20 minutes, 2-3 times a day.  Remove the ice if your skin turns bright red. This is very important. If you cannot feel pain, heat, or cold, you have a greater risk of damage to the area.   Activity  Return to your normal activities as told by your health care provider. Ask your health care provider what activities are safe for you.  Include low-impact activities, such as swimming, in your exercise routine.  Check with your health care provider to make sure running is safe for you.  Do exercises as told by your health care provider. General instructions  Take over-the-counter and prescription medicines only as told by your health care provider.  Make sure you wear shoes that fit well and have good cushioning and arch support.  Keep all follow-up visits. This is important. How is this prevented?  Warm up and stretch before being active.  Cool down and stretch after being active.  Give your body time to rest between periods of activity.  Consider getting help from a coach or trainer to come up with a safe running plan and a plan to advance (progress) your training that fits your goals and ability.  Change directions often while running around a track.  Replace your shoes when the soles are worn out.  Maintain physical fitness. This includes strength and flexibility. Contact a health care provider if:  Your pain does not improve.  Your pain gets worse even with treatment. Summary  Iliotibial band syndrome is  a condition that often causes knee pain. It can also cause pain in the outside of your hip, thigh, and knee.  Treatment includes taking NSAIDs, resting, returning to activities gradually, and doing physical therapy  exercises.  Return to your normal activities as told by your health care provider. Ask your health care provider what activities are safe for you. This information is not intended to replace advice given to you by your health care provider. Make sure you discuss any questions you have with your health care provider. Document Revised: 09/13/2019 Document Reviewed: 09/13/2019 Elsevier Patient Education  2021 [place]. Neck Exercises Ask your health care provider which exercises are safe for you. Do exercises exactly as told by your health care provider and adjust them as directed. It is normal to feel mild stretching, pulling, tightness, or discomfort as you do these exercises. Stop right away if you feel sudden pain or your pain gets worse. Do not begin these exercises until told by your health care provider. Neck exercises can be important for many reasons. They can improve strength and maintain flexibility in your neck, which will help your upper back and prevent neck pain. Stretching exercises Rotation neck stretching 1. Sit in a chair or stand up. 2. Place your feet flat on the floor, shoulder width apart. 3. Slowly turn your head (rotate) to the right until a slight stretch is felt. Turn it all the way to the right so you can look over your right shoulder. Do not tilt or tip your head. 4. Hold this position for 10-30 seconds. 5. Slowly turn your head (rotate) to the left until a slight stretch is felt. Turn it all the way to the left so you can look over your left shoulder. Do not tilt or tip your head. 6. Hold this position for 10-30 seconds. Repeat __________ times. Complete this exercise __________ times a day.   Neck retraction 1. Sit in a sturdy chair or stand up. 2. Look straight ahead. Do not bend your neck. 3. Use your fingers to push your chin backward (retraction). Do not bend your neck for this movement. Continue to face straight ahead. If you are doing the exercise properly,  you will feel a slight sensation in your throat and a stretch at the back of your neck. 4. Hold the stretch for 1-2 seconds. Repeat __________ times. Complete this exercise __________ times a day. Strengthening exercises Neck press 1. Lie on your back on a firm bed or on the floor with a pillow under your head. 2. Use your neck muscles to push your head down on the pillow and straighten your spine. 3. Hold the position as well as you can. Keep your head facing up (in a neutral position) and your chin tucked. 4. Slowly count to 5 while holding this position. Repeat __________ times. Complete this exercise __________ times a day. Isometrics These are exercises in which you strengthen the muscles in your neck while keeping your neck still (isometrics). 1. Sit in a supportive chair and place your hand on your forehead. 2. Keep your head and face facing straight ahead. Do not flex or extend your neck while doing isometrics. 3. Push forward with your head and neck while pushing back with your hand. Hold for 10 seconds. 4. Do the sequence again, this time putting your hand against the back of your head. Use your head and neck to push backward against the hand pressure. 5. Finally, do the same exercise on either side of  your head, pushing sideways against the pressure of your hand. Repeat __________ times. Complete this exercise __________ times a day. Prone head lifts 1. Lie face-down (prone position), resting on your elbows so that your chest and upper back are raised. 2. Start with your head facing downward, near your chest. Position your chin either on or near your chest. 3. Slowly lift your head upward. Lift until you are looking straight ahead. Then continue lifting your head as far back as you can comfortably stretch. 4. Hold your head up for 5 seconds. Then slowly lower it to your starting position. Repeat __________ times. Complete this exercise __________ times a day. Supine head  lifts 1. Lie on your back (supine position), bending your knees to point to the ceiling and keeping your feet flat on the floor. 2. Lift your head slowly off the floor, raising your chin toward your chest. 3. Hold for 5 seconds. Repeat __________ times. Complete this exercise __________ times a day. Scapular retraction 1. Stand with your arms at your sides. Look straight ahead. 2. Slowly pull both shoulders (scapulae) backward and downward (retraction) until you feel a stretch between your shoulder blades in your upper back. 3. Hold for 10-30 seconds. 4. Relax and repeat. Repeat __________ times. Complete this exercise __________ times a day. Contact a health care provider if:  Your neck pain or discomfort gets much worse when you do an exercise.  Your neck pain or discomfort does not improve within 2 hours after you exercise. If you have any of these problems, stop exercising right away. Do not do the exercises again unless your health care provider says that you can. Get help right away if:  You develop sudden, severe neck pain. If this happens, stop exercising right away. Do not do the exercises again unless your health care provider says that you can. This information is not intended to replace advice given to you by your health care provider. Make sure you discuss any questions you have with your health care provider. Document Revised: 03/11/2018 Document Reviewed: 03/11/2018 Elsevier Patient Education  2021 [employer].
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
## 2020-06-20 ENCOUNTER — Encounter: Payer: Self-pay | Admitting: Physician Assistant

## 2020-06-21 ENCOUNTER — Encounter: Payer: Self-pay | Admitting: Adult Health

## 2020-06-22 ENCOUNTER — Other Ambulatory Visit: Payer: Self-pay | Admitting: Adult Health

## 2020-06-22 MED ORDER — ALPRAZOLAM 0.5 MG PO TABS: 0.5000 mg | ORAL_TABLET | Freq: Three times a day (TID) | ORAL | 0 refills | Status: DC

## 2020-06-22 NOTE — Progress Notes (Signed)
Meds ordered this encounter  Medications  . ALPRAZolam (XANAX) 0.5 MG tablet    Sig: Take 1 tablet (0.5 mg total) by mouth 3 (three) times daily.    Dispense:  90 tablet    Refill:  0

## 2020-06-30 ENCOUNTER — Encounter: Payer: Self-pay | Admitting: Physician Assistant
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
## 2020-07-05 ENCOUNTER — Other Ambulatory Visit: Payer: Self-pay

## 2020-07-05 ENCOUNTER — Ambulatory Visit
Admission: RE | Admit: 2020-07-05 | Discharge: 2020-07-05 | Disposition: A | Discharge status: Home / Self Care | Payer: 59 | Source: Ambulatory Visit | Attending: Adult Health | Admitting: Adult Health

## 2020-07-05 ENCOUNTER — Encounter: Payer: Self-pay | Admitting: Adult Health

## 2020-07-05 ENCOUNTER — Ambulatory Visit (INDEPENDENT_AMBULATORY_CARE_PROVIDER_SITE_OTHER): Payer: 59 | Admitting: Adult Health

## 2020-07-05 VITALS — BP 103/64 | HR 77 | Temp 97.7°F | Resp 16 | Wt 154.4 lb

## 2020-07-05 DIAGNOSIS — M7062 Trochanteric bursitis, left hip: Secondary | ICD-10-CM | POA: Diagnosis present

## 2020-07-05 DIAGNOSIS — M542 Cervicalgia: Secondary | ICD-10-CM

## 2020-07-05 DIAGNOSIS — M7061 Trochanteric bursitis, right hip: Secondary | ICD-10-CM

## 2020-07-05 DIAGNOSIS — M62838 Other muscle spasm: Secondary | ICD-10-CM | POA: Diagnosis not present

## 2020-07-05 DIAGNOSIS — R202 Paresthesia of skin: Secondary | ICD-10-CM

## 2020-07-05 IMAGING — CR DG HIP (WITH OR WITHOUT PELVIS) 2-3V*R*
1 series · 3 of 3 positions shown · non-contrast
Comparison: None.

CLINICAL DATA: Chronic right greater than left hip pain.

EXAM:
DG HIP (WITH OR WITHOUT PELVIS) 2-3V LEFT; DG HIP (WITH OR WITHOUT
PELVIS) 2-3V RIGHT

[Series 1: dg hip unilat w or w/o pelvis 2-3 views  · non-contrast · 0.14mm/px · 3 of 3 slices shown]
[im 1/3]
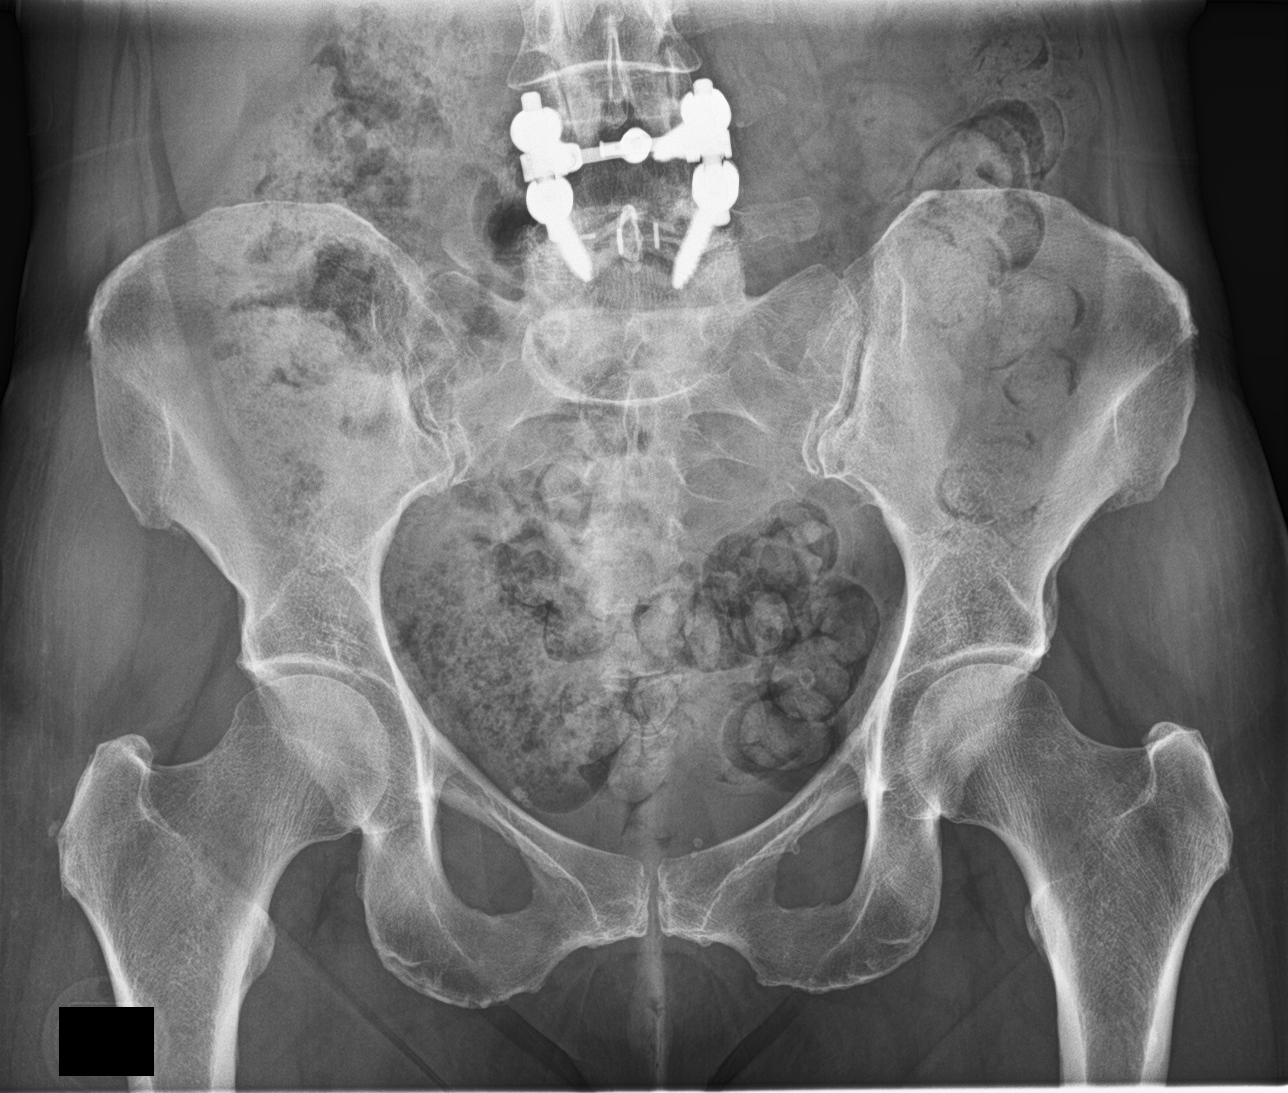
[im 2/3]
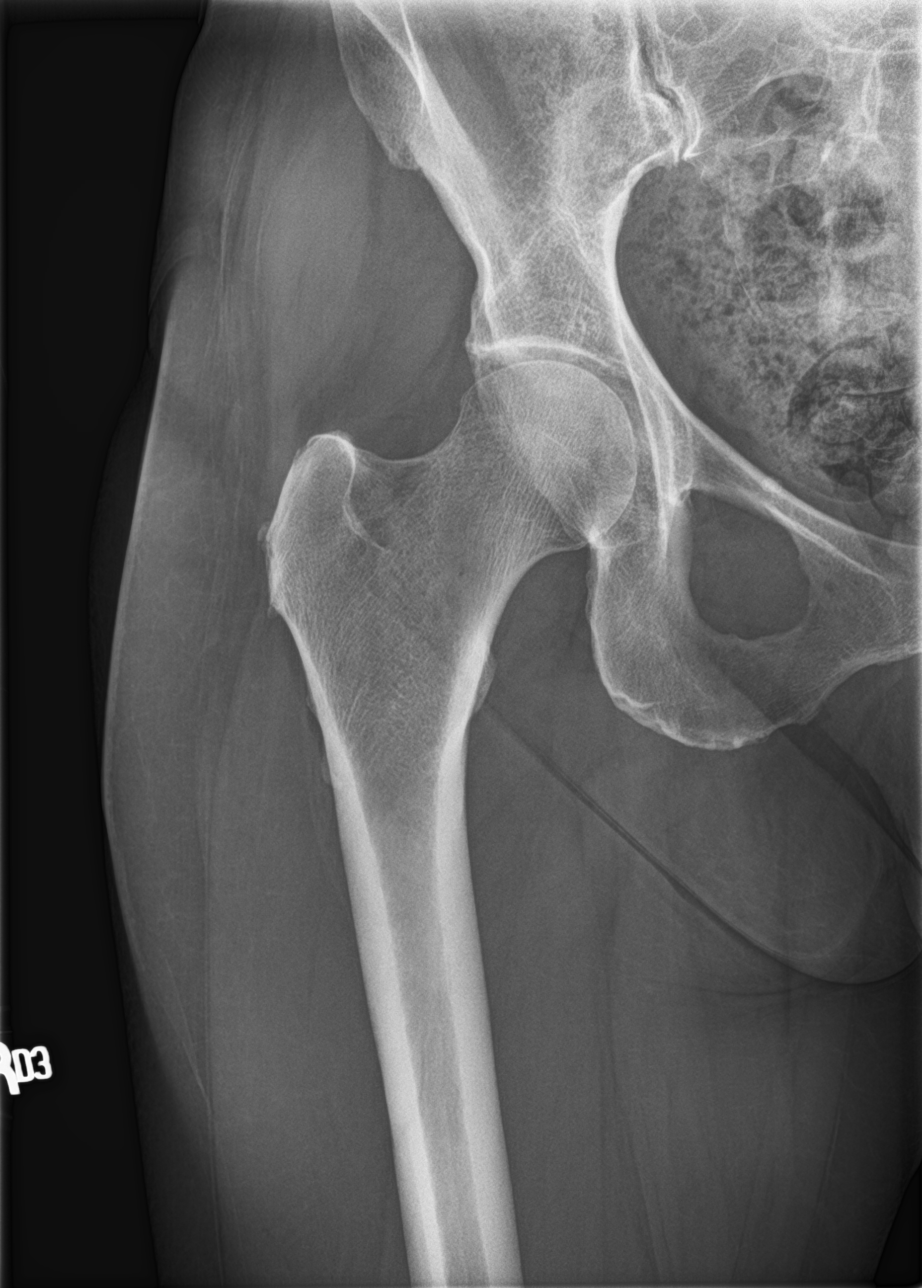
[im 3/3]
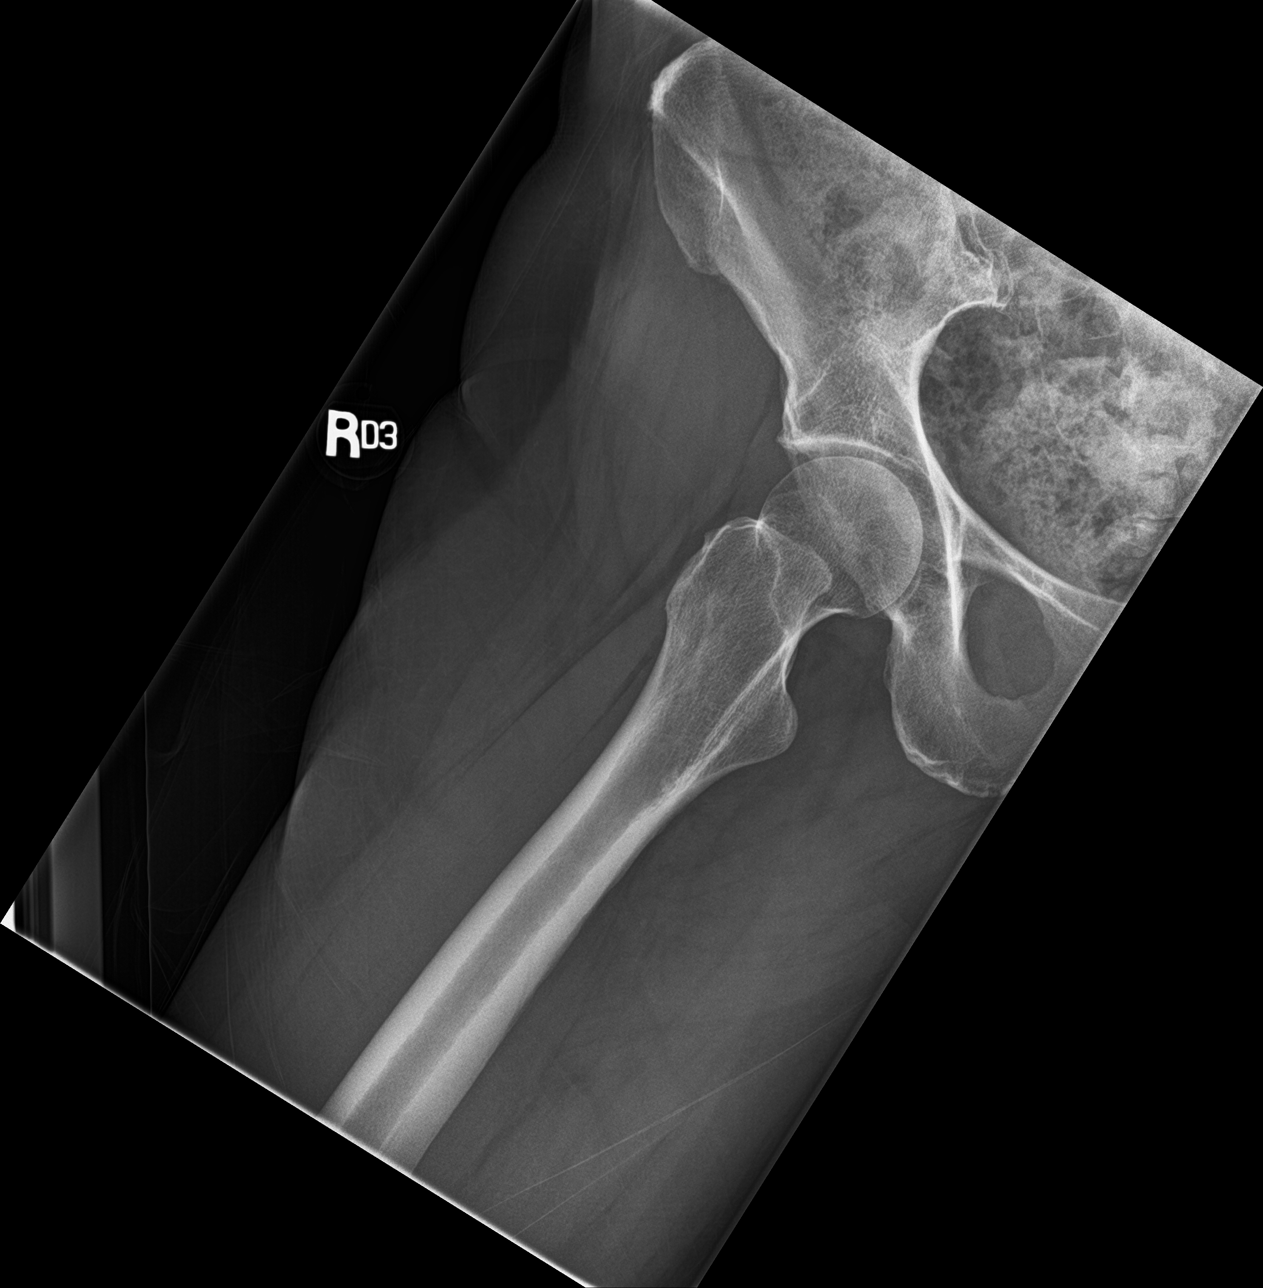

[3 of 3 positions shown; findings below may reference images not displayed]

FINDINGS: No acute fracture or dislocation. Joint spaces are preserved. Tiny
right acetabular marginal osteophyte. The pubic symphysis and
sacroiliac joints are unremarkable. Prior L4-L5 fusion. Bone
mineralization is normal. Soft tissues are unremarkable.
IMPRESSION: 1. Minimal right hip degenerative changes.

## 2020-07-05 IMAGING — CR DG HIP (WITH OR WITHOUT PELVIS) 2-3V*L*
1 series · 3 of 3 positions shown · non-contrast
Comparison: None.

CLINICAL DATA: Chronic right greater than left hip pain.

EXAM:
DG HIP (WITH OR WITHOUT PELVIS) 2-3V LEFT; DG HIP (WITH OR WITHOUT
PELVIS) 2-3V RIGHT

[Series 1: dg hip unilat w or w/o pelvis 2-3 views  · non-contrast · 0.14mm/px · 3 of 3 slices shown]
[im 1/3]
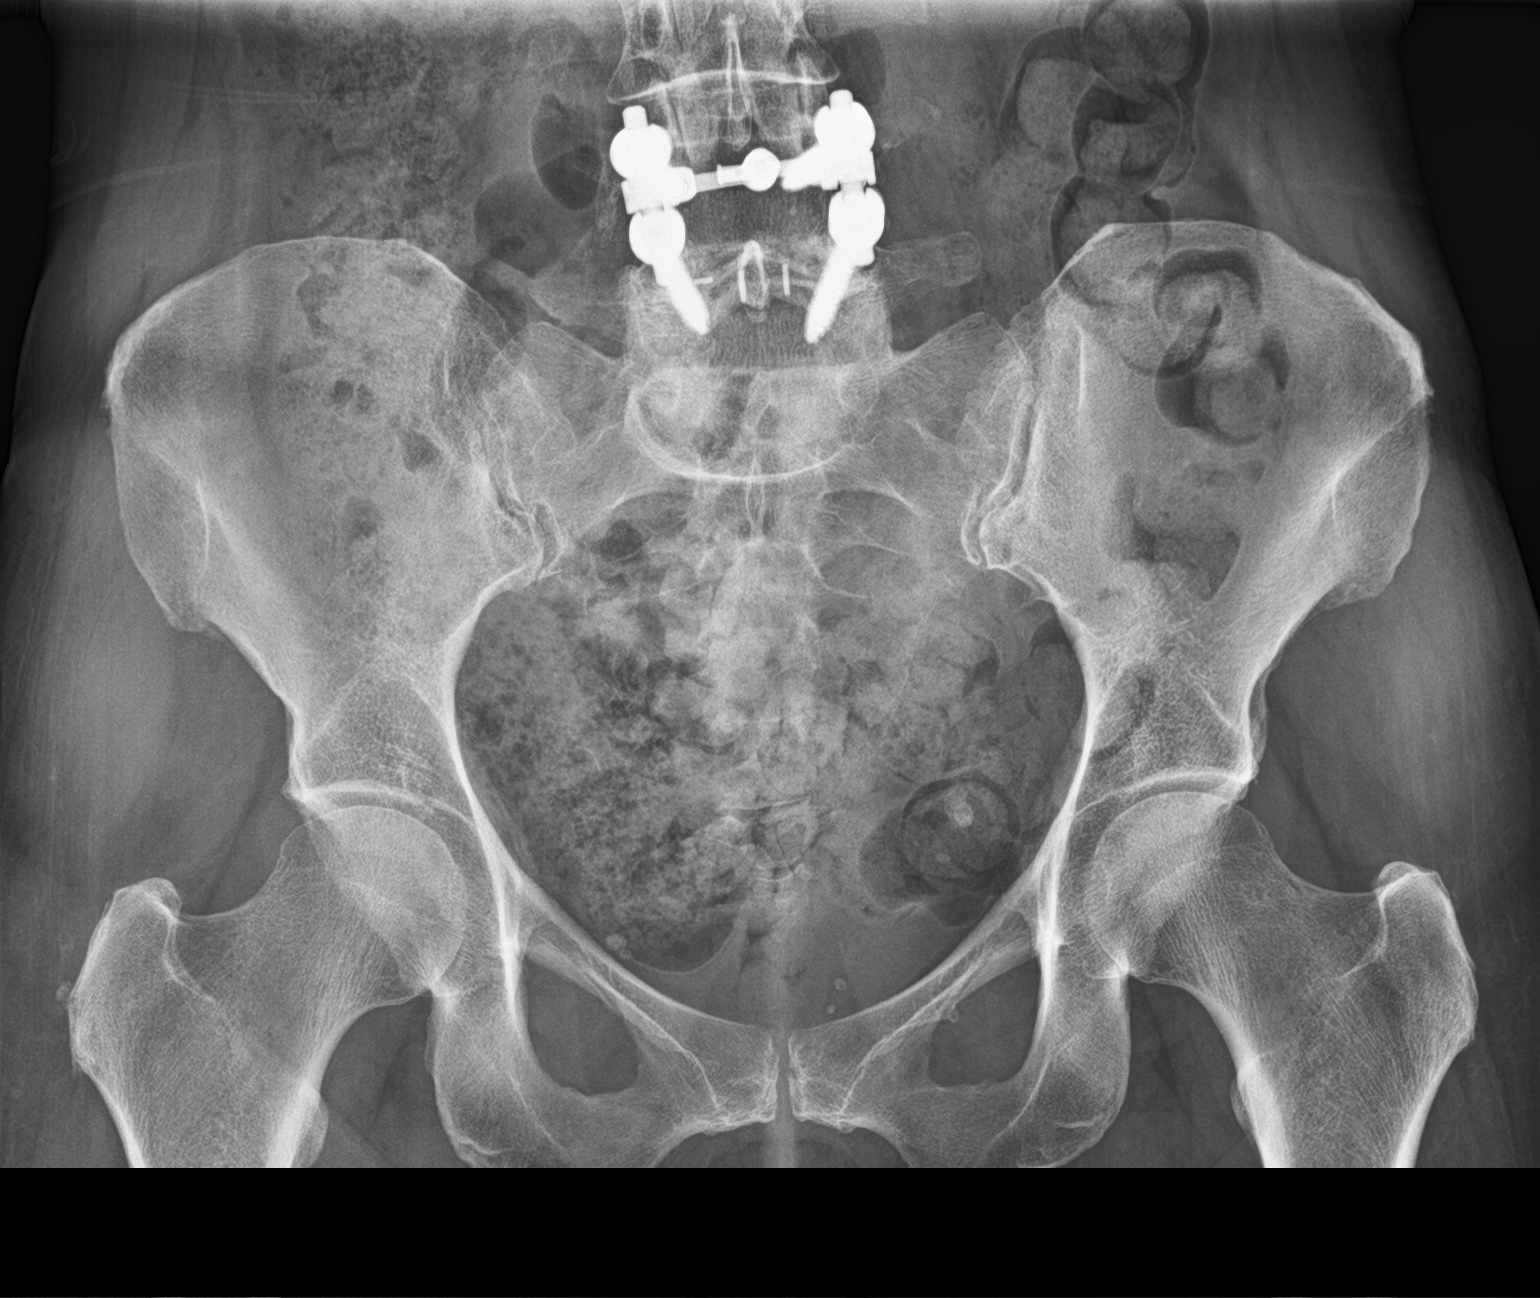
[im 2/3]
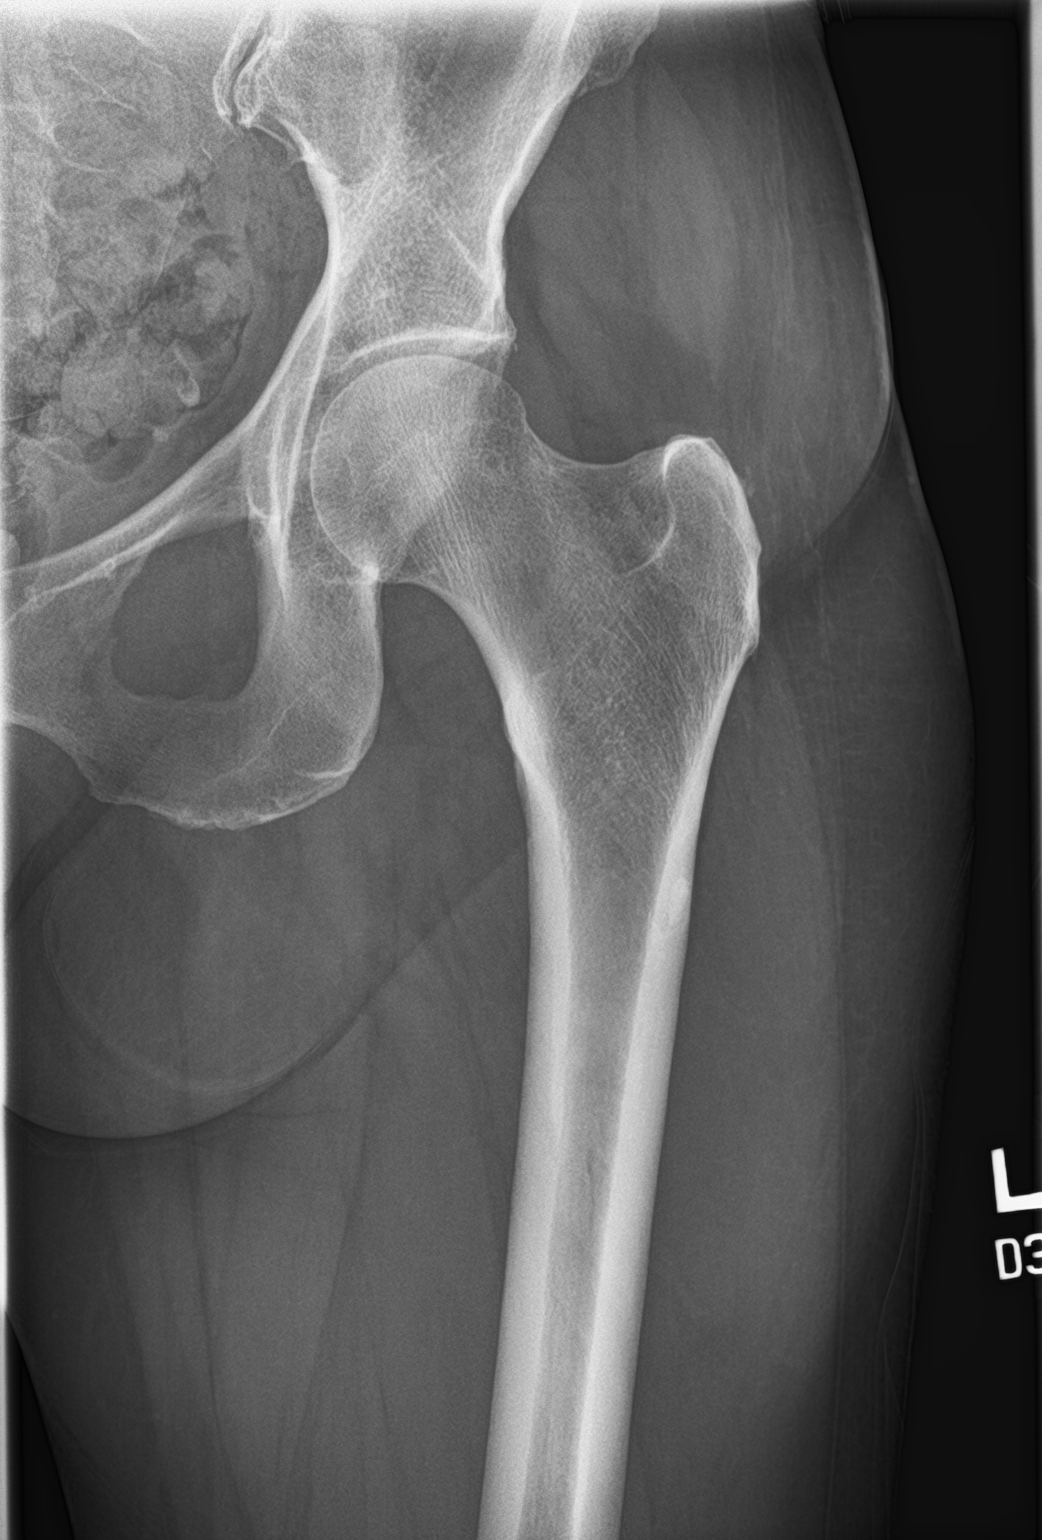
[im 3/3]
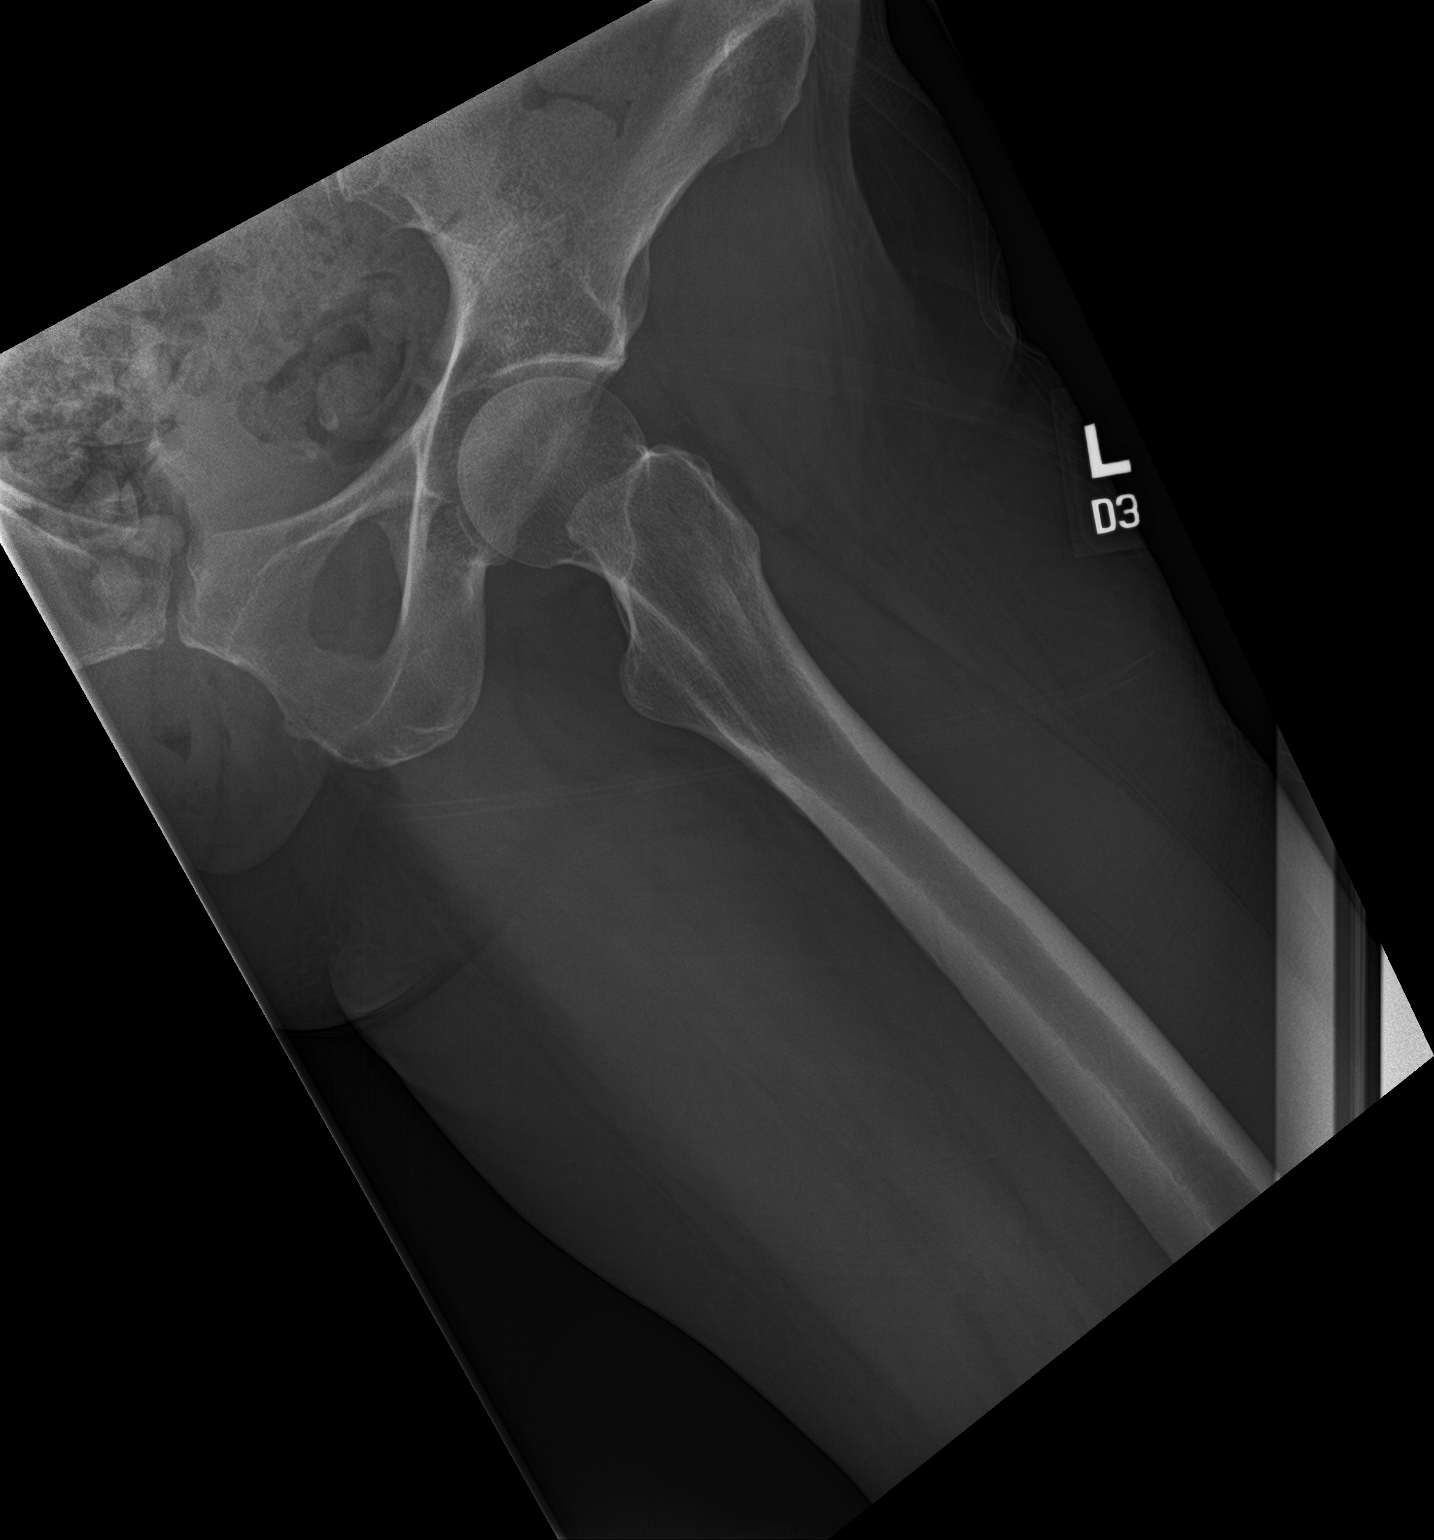

[3 of 3 positions shown; findings below may reference images not displayed]

FINDINGS: No acute fracture or dislocation. Joint spaces are preserved. Tiny
right acetabular marginal osteophyte. The pubic symphysis and
sacroiliac joints are unremarkable. Prior L4-L5 fusion. Bone
mineralization is normal. Soft tissues are unremarkable.
IMPRESSION: 1. Minimal right hip degenerative changes.

## 2020-07-05 IMAGING — CR DG CERVICAL SPINE COMPLETE 4+V
1 series · 6 of 6 positions shown · non-contrast
Comparison: None.

CLINICAL DATA: Chronic right-sided neck pain now radiating into the
right shoulder and arm for the past month.

EXAM:
CERVICAL SPINE - COMPLETE 4+ VIEW

[Series 1: dg cervical spine complete · 0.14mm/px · 6 of 6 slices shown]
[im 1/6]
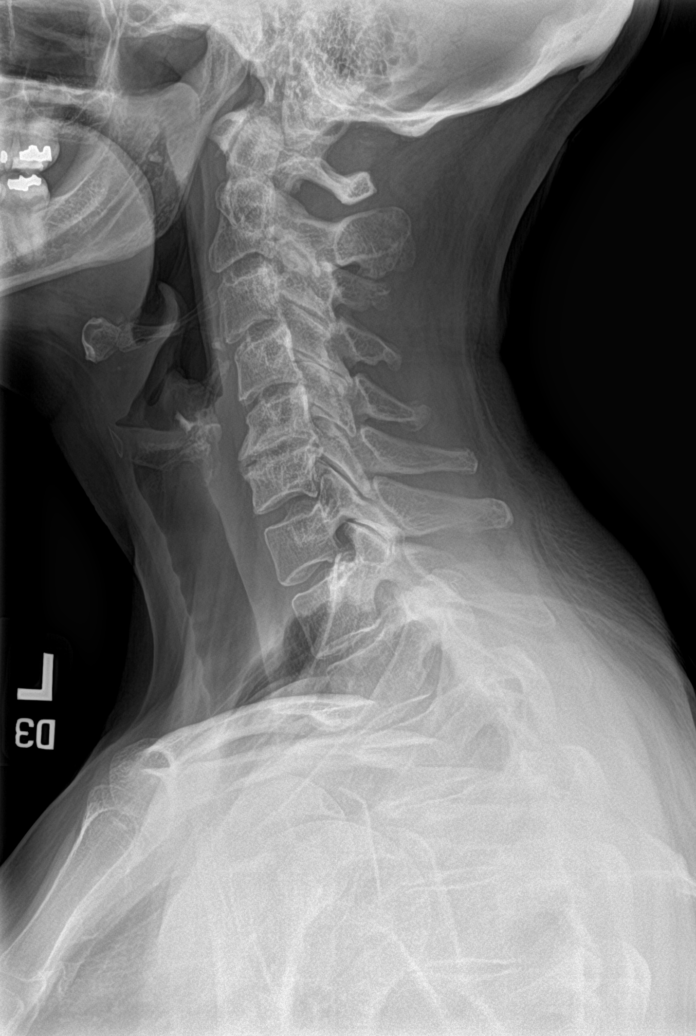
[im 2/6]
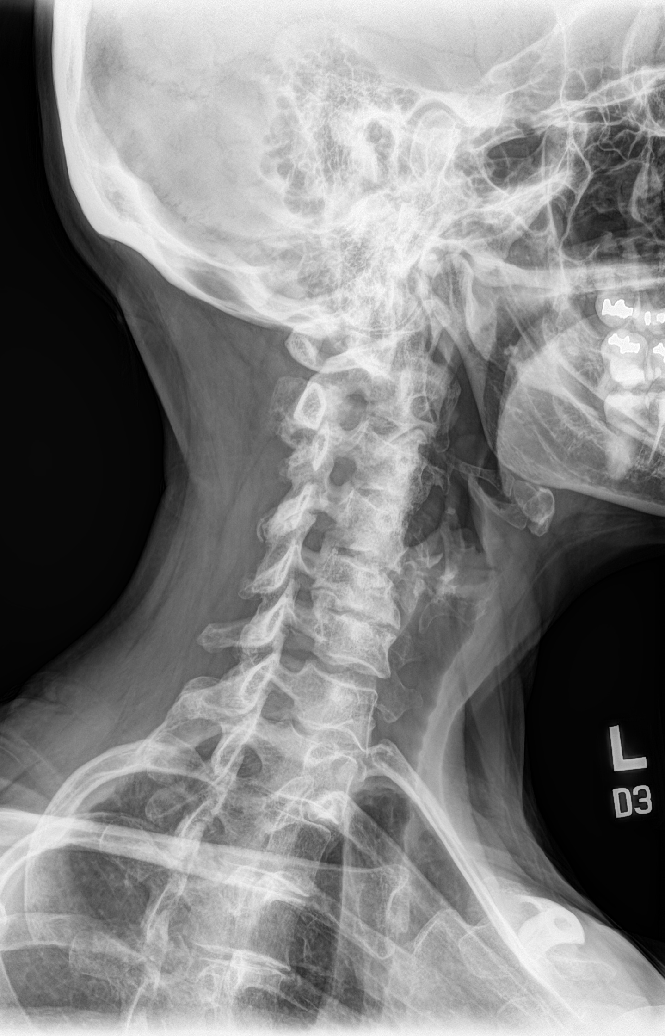
[im 3/6]
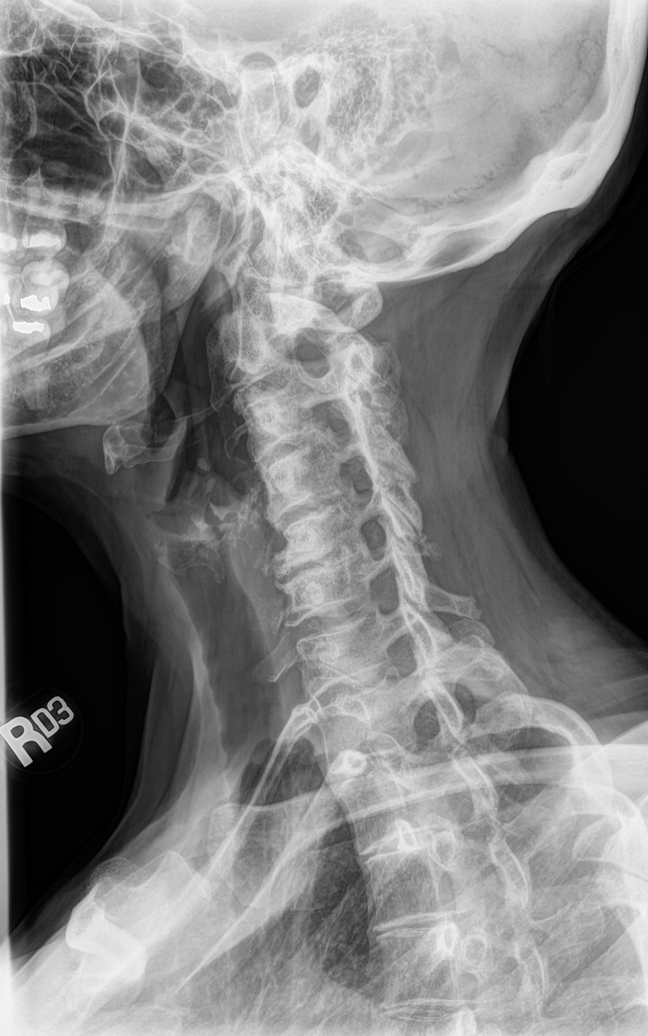
[im 4/6]
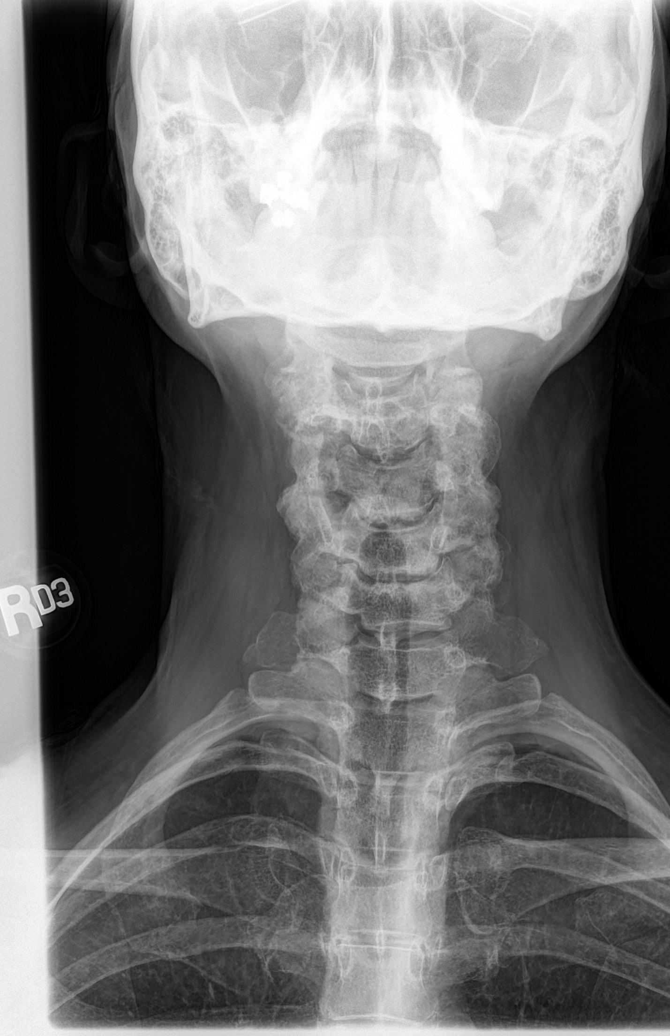
[im 5/6]
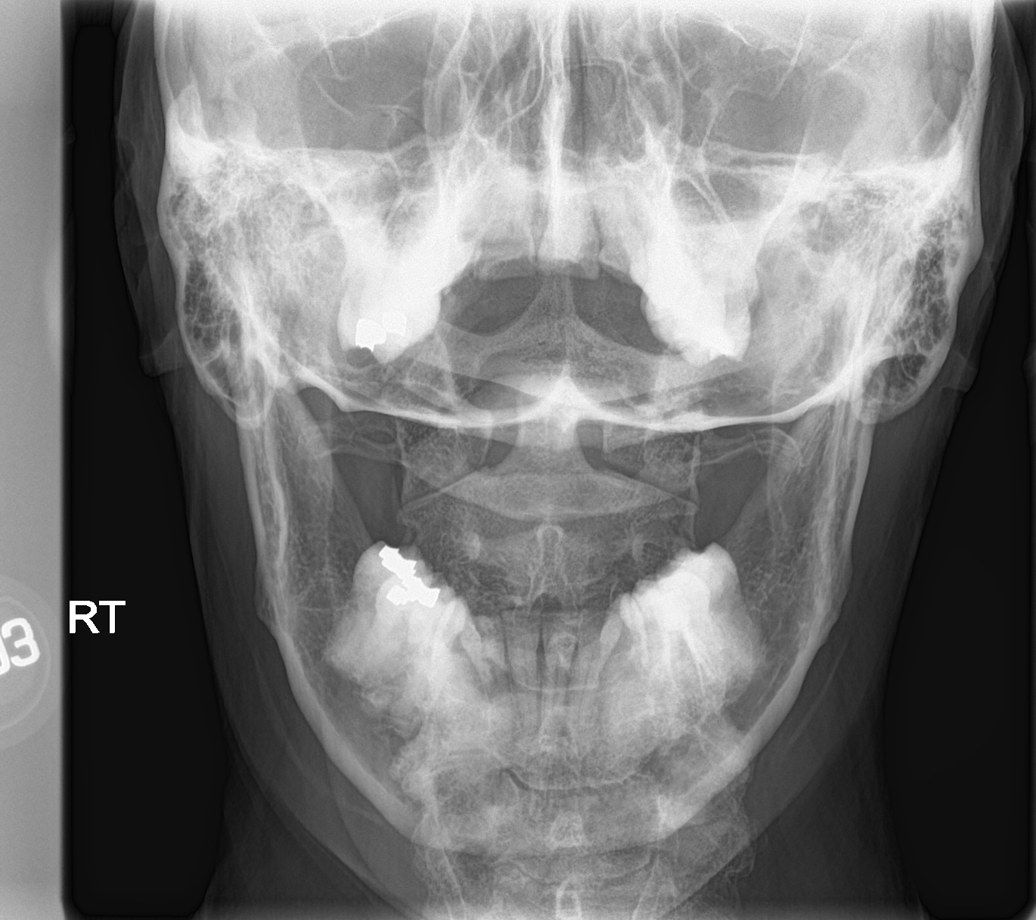
[im 6/6]
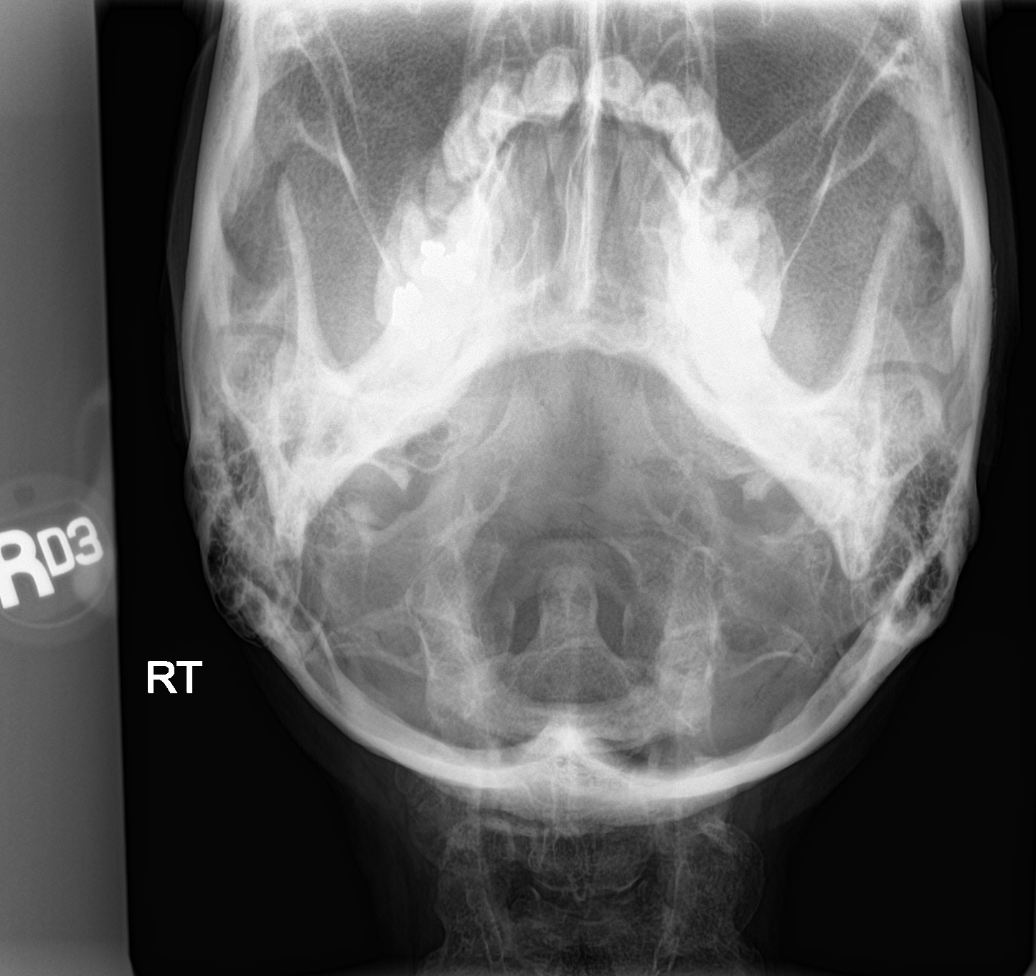

[6 of 6 positions shown; findings below may reference images not displayed]

FINDINGS: The lateral view is diagnostic to the T2-T3 level.

There is no acute fracture or subluxation. Vertebral body heights
are preserved.

Straightening of the normal cervical lordosis.  No listhesis.

Moderate disc height loss and endplate spurring at C5-C6. Mild disc
height loss at C4-C5. Mild to moderate facet uncovertebral
hypertrophy from C3-C4 through C5-C6. Mild neuroforaminal stenosis
on the right at C5-C6 and on the left at C3-C4. Mild degenerative
changes of the right C1-C2 atlantoaxial joint.

Normal prevertebral soft tissues.
IMPRESSION: 1. Multilevel cervical spondylosis as described above, moderate at
C5-C6.

## 2020-07-05 MED ORDER — METHOCARBAMOL 500 MG PO TABS: 500.0000 mg | ORAL_TABLET | Freq: Four times a day (QID) | ORAL | 0 refills | Status: DC

## 2020-07-05 MED ORDER — PREDNISONE 10 MG (21) PO TBPK: ORAL_TABLET | ORAL | 0 refills | Status: DC

## 2020-07-05 NOTE — Patient Instructions (Signed)
Hip Pain The hip is the joint between the upper legs and the lower pelvis. The bones, cartilage, tendons, and muscles of your hip joint support your body and allow you to move around. Hip pain can range from a minor ache to severe pain in one or both of your hips. The pain may be felt on the inside of the hip joint near the groin, or on the outside near the buttocks and upper thigh. You may also have swelling or stiffness in your hip area. Follow these instructions at home: Managing pain, stiffness, and swelling  If directed, put ice on the painful area. To do this: ? Put ice in a plastic bag. ? Place a towel between your skin and the bag. ? Leave the ice on for 20 minutes, 2-3 times a day.  If directed, apply heat to the affected area as often as told by your health care provider. Use the heat source that your health care provider recommends, such as a moist heat pack or a heating pad. ? Place a towel between your skin and the heat source. ? Leave the heat on for 20-30 minutes. ? Remove the heat if your skin turns bright red. This is especially important if you are unable to feel pain, heat, or cold. You may have a greater risk of getting burned.      Activity  Do exercises as told by your health care provider.  Avoid activities that cause pain. General instructions  Take over-the-counter and prescription medicines only as told by your health care provider.  Keep a journal of your symptoms. Write down: ? How often you have hip pain. ? The location of your pain. ? What the pain feels like. ? What makes the pain worse.  Sleep with a pillow between your legs on your most comfortable side.  Keep all follow-up visits as told by your health care provider. This is important.   Contact a health care provider if:  You cannot put weight on your leg.  Your pain or swelling continues or gets worse after one week.  It gets harder to walk.  You have a fever. Get help right away  if:  You fall.  You have a sudden increase in pain and swelling in your hip.  Your hip is red or swollen or very tender to touch. Summary  Hip pain can range from a minor ache to severe pain in one or both of your hips.  The pain may be felt on the inside of the hip joint near the groin, or on the outside near the buttocks and upper thigh.  Avoid activities that cause pain.  Write down how often you have hip pain, the location of the pain, what makes it worse, and what it feels like. This information is not intended to replace advice given to you by your health care provider. Make sure you discuss any questions you have with your health care provider. Document Revised: 09/28/2018 Document Reviewed: 09/28/2018 Elsevier Patient Education  [place]. Cervical Radiculopathy  Cervical radiculopathy means that a nerve in the neck (a cervical nerve) is pinched or bruised. This can happen because of an injury to the cervical spine (vertebrae) in the neck, or as a normal part of getting older. This can cause pain or loss of feeling (numbness) that runs from your neck all the way down to your arm and fingers. Often, this condition gets better with rest. Treatment may be needed if the condition does not  get better. What are the causes?  A neck injury.  A bulging disk in your spine.  Muscle movements that you cannot control (muscle spasms).  Tight muscles in your neck due to overuse.  Arthritis.  Breakdown in the bones and joints of the spine (spondylosis) due to getting older.  Bone spurs that form near the nerves in the neck. What are the signs or symptoms?  Pain. The pain may: ? Run from the neck to the arm and hand. ? Be very bad or irritating. ? Be worse when you move your neck.  Loss of feeling or tingling in your arm or hand.  Weakness in your arm or hand, in very bad cases. How is this treated? In many cases, treatment is not needed for this condition. With rest,  the condition often gets better over time. If treatment is needed, options may include:  Wearing a soft neck collar (cervical collar) for short periods of time, as told by your doctor.  Doing exercises (physical therapy) to strengthen your neck muscles.  Taking medicines.  Having shots (injections) in your spine, in very bad cases.  Having surgery. This may be needed if other treatments do not help. The type of surgery that is used depends on the cause of your condition. Follow these instructions at home: If you have a soft neck collar:  Wear it as told by your doctor. Remove it only as told by your doctor.  Ask your doctor if you can remove the collar for cleaning and bathing. If you are allowed to remove the collar for cleaning or bathing: ? Follow instructions from your doctor about how to remove the collar safely. ? Clean the collar by wiping it with mild soap and water and drying it completely. ? Take out any removable pads in the collar every 1-2 days. Wash them by hand with soap and water. Let them air-dry completely before you put them back in the collar. ? Check your skin under the collar for redness or sores. If you see any, tell your doctor. Managing pain  Take over-the-counter and prescription medicines only as told by your doctor.  If told, put ice on the painful area. ? If you have a soft neck collar, remove it as told by your doctor. ? Put ice in a plastic bag. ? Place a towel between your skin and the bag. ? Leave the ice on for 20 minutes, 2-3 times a day.  If using ice does not help, you can try using heat. Use the heat source that your doctor recommends, such as a moist heat pack or a heating pad. ? Place a towel between your skin and the heat source. ? Leave the heat on for 20-30 minutes. ? Remove the heat if your skin turns bright red. This is very important if you are unable to feel pain, heat, or cold. You may have a greater risk of getting burned.  You may  try a gentle neck and shoulder rub (massage).      Activity  Rest as needed.  Return to your normal activities as told by your doctor. Ask your doctor what activities are safe for you.  Do exercises as told by your doctor or physical therapist.  Do not lift anything that is heavier than 10 lb (4.5 kg) until your doctor tells you that it is safe. General instructions  Use a flat pillow when you sleep.  Do not drive while wearing a soft neck collar. If you do  not have a soft neck collar, ask your doctor if it is safe to drive while your neck heals.  Ask your doctor if the medicine prescribed to you requires you to avoid driving or using heavy machinery.  Do not use any products that contain nicotine or tobacco, such as cigarettes, e-cigarettes, and chewing tobacco. These can delay healing. If you need help quitting, ask your doctor.  Keep all follow-up visits as told by your doctor. This is important. Contact a doctor if:  Your condition does not get better with treatment. Get help right away if:  Your pain gets worse and is not helped with medicine.  You lose feeling or feel weak in your hand, arm, face, or leg.  You have a high fever.  You have a stiff neck.  You cannot control when you poop or pee (have incontinence).  You have trouble with walking, balance, or talking. Summary  Cervical radiculopathy means that a nerve in the neck is pinched or bruised.  A nerve can get pinched from a bulging disk, arthritis, an injury to the neck, or other causes.  Symptoms include pain, tingling, or loss of feeling that goes from the neck into the arm or hand.  Weakness in your arm or hand can happen in very bad cases.  Treatment may include resting, wearing a soft neck collar, and doing exercises. You might need to take medicines for pain. In very bad cases, shots or surgery may be needed. This information is not intended to replace advice given to you by your health care  provider. Make sure you discuss any questions you have with your health care provider. Document Revised: 04/03/2018 Document Reviewed: 04/03/2018 Elsevier Patient Education  2021 [employer].

## 2020-07-05 NOTE — Progress Notes (Signed)
Very minimal right hip degenerative changes, be sure to get recommended daily calcium and vitamin D. If any worsening symptoms follow up may need orthopedics.

## 2020-07-05 NOTE — Progress Notes (Signed)
Edit Notifications Back to Top   Straightening of the cervical lordosis often indicates muscle spasms, this is seen on her x ray.   She does have loss of moderate disc height with other changes. Moderate degeneration at C5- C6 will order MRI and please verify with her no foreign bodies in her body or pace maker, claustrophobia ?

## 2020-07-05 NOTE — Progress Notes (Signed)
Straightening of the cervical lordosis often indicates muscle spasms, this is seen on her x ray.   She does have loss of moderate disc height with other changes. Moderate degeneration at C5- C6 will order MRI and please verify with her no foreign bodies in her body or pace maker, claustrophobia ?

## 2020-07-05 NOTE — Progress Notes (Signed)
Small bone spur. Very minimal degenerative hip change in right hip as well. Be sure to get adequate daily calcium and vitamin D. Follow up and orthopedics if no improvement with current treatment plan.

## 2020-07-06 ENCOUNTER — Telehealth: Payer: Self-pay

## 2020-07-06 NOTE — Telephone Encounter (Signed)
Saw Laverna Peace, NP (her PCP) in follow up on 07/05/20

## 2020-07-06 NOTE — Telephone Encounter (Signed)
Lisa Gonzalez,  Can you have the right hip pain evaluation added to the referral to [facility].  Order was originally for her neck pain but per the message from [facility], patient could have referral to ortho for the hip pain.  Patient reported that the hip hurts her bad enough that she was unable to get out of bed yesterday. Thanks

## 2020-07-12 ENCOUNTER — Ambulatory Visit: Payer: 59 | Admitting: Physician Assistant

## 2020-07-12 ENCOUNTER — Encounter: Payer: Self-pay | Admitting: Adult Health

## 2020-07-13 ENCOUNTER — Telehealth: Payer: Self-pay

## 2020-07-13 ENCOUNTER — Other Ambulatory Visit: Payer: Self-pay | Admitting: Adult Health

## 2020-07-13 DIAGNOSIS — G8929 Other chronic pain: Secondary | ICD-10-CM

## 2020-07-13 DIAGNOSIS — R202 Paresthesia of skin: Secondary | ICD-10-CM

## 2020-07-13 DIAGNOSIS — M5030 Other cervical disc degeneration, unspecified cervical region: Secondary | ICD-10-CM

## 2020-07-13 NOTE — Telephone Encounter (Signed)
Patient last seen 29/22 please advise if MRI is appropriate to order. KW

## 2020-07-13 NOTE — Telephone Encounter (Signed)
Copied from [place] [id number]. Topic: Referral - Question >> Jul 13, 2020 11:39 AM Pawlus, [name] wrote: Reason for CRM: PT called in stating she needs an MRI for her neck, PT states she is in a lot of pain, also needs clarification regarding the referrals she needs. Please CB.

## 2020-07-13 NOTE — Telephone Encounter (Signed)
See note below previously resulted, please call patient:   Progress Notes by Doreen Beam, FNP at 07/05/2020 9:20 AM Author: Doreen Beam, FNP Author Type: Family Nurse Practitioner Filed: 07/05/2020 4:46 PM Note Status: Signed Cosign: Cosign Not Required Encounter Date: 07/05/2020 Editor: Doreen Beam, FNP (Family Nurse Practitioner)     Straightening of the cervical lordosis often indicates muscle spasms, this is seen on her x ray.  She does have loss of moderate disc height with other changes. Moderate degeneration at C5- C6 will order MRI and please verify with her no foreign bodies in her body or pace maker, claustrophobia ?  Referral to  neurosurgeon of choice. Emergency room if any worsening/ severe symptoms

## 2020-07-13 NOTE — Progress Notes (Addendum)
Orders Placed This Encounter  Procedures  . MR Cervical Spine Wo Contrast    She has hard wear and screws in her back and is claustrophobic.    Order Specific Question:   What is the patient's sedation requirement?    Answer:   Anti-anxiety    Order Specific Question:   Does the patient have a pacemaker or implanted devices?    Answer:   Yes    Order Specific Question:   Manufacturer of pacemake or implanted device?    Answer:   She has hard wear and screws in her back and is claustrophobic. no pacemaker or other implants    Order Specific Question:   Preferred imaging location?    Answer:   [facility] (table limit - 550lbs)    Order Specific Question:   Call Results- Best Contact Number?    Answer:   [phone]  . Ambulatory referral to Neurosurgery    Referral Priority:   Urgent    Referral Type:   Surgical    Referral Reason:   Specialty Services Required    Referred to Provider:   Meade Maw, MD    Requested Specialty:   Neurosurgery    Number of Visits Requested:   1

## 2020-07-14 DIAGNOSIS — M5412 Radiculopathy, cervical region: Secondary | ICD-10-CM | POA: Insufficient documentation

## 2020-07-14 NOTE — Telephone Encounter (Signed)
MRI order has been placed. Referrals will work on authorization. It can be lengthy process. I did place referral to Dr. Cari Caraway neurosurgeon and did advise patient through my chart to take advantage of [facility] walk in clinic Monday to Friday 1 pm to 7 pm if needed.

## 2020-07-14 NOTE — Telephone Encounter (Signed)
Spoke with patient on the phone and advised her of below. Patient stats that she has a metal fusion in her back, she says she has screws placed. Patient states that she is claustrophobic, she states that she was seen previously by [facility] and request not to return back. Patient states that when she went to [facility] they only suggested PT and injections which she states did not help her. Patient states that if provider needs to authorize MRI for insurance they can be reached at [phone]. I advised patient that this information will be forwarded when referral is sent. KW

## 2020-07-14 NOTE — Addendum Note (Signed)
Addended by: Doreen Beam on: 07/14/2020 08:40 AM   Modules accepted: Orders

## 2020-07-18 ENCOUNTER — Encounter: Payer: Self-pay | Admitting: Adult Health

## 2020-07-18 ENCOUNTER — Telehealth: Payer: Self-pay | Admitting: Adult Health

## 2020-07-18 NOTE — Telephone Encounter (Signed)
lmtcb okay for [facility] triage nurse to advise patient of provider message below. KW

## 2020-07-18 NOTE — Telephone Encounter (Signed)
Ok to take Xanax 1mg  by mouth 30 minutes prior to MRI and do not self drive.

## 2020-07-18 NOTE — Telephone Encounter (Signed)
Pt given recommendations per Laverna Peace, "Ok to take Xanax 1mg  by mouth 30 minutes prior to MRI and do not self drive"; she verbalized understanding and says her MRI is scheduled for tomorrow; will route to office for notification.

## 2020-07-18 NOTE — Telephone Encounter (Signed)
Pt is anxious about getting MRI. She has xanax on hand and wants to know how much she can take to relax

## 2020-07-19 ENCOUNTER — Other Ambulatory Visit: Payer: Self-pay

## 2020-07-19 ENCOUNTER — Telehealth: Payer: Self-pay

## 2020-07-19 ENCOUNTER — Ambulatory Visit
Admission: RE | Admit: 2020-07-19 | Discharge: 2020-07-19 | Disposition: A | Discharge status: Home / Self Care | Payer: 59 | Source: Ambulatory Visit | Attending: Adult Health | Admitting: Adult Health

## 2020-07-19 DIAGNOSIS — M7061 Trochanteric bursitis, right hip: Secondary | ICD-10-CM

## 2020-07-19 DIAGNOSIS — R202 Paresthesia of skin: Secondary | ICD-10-CM | POA: Diagnosis present

## 2020-07-19 DIAGNOSIS — G8929 Other chronic pain: Secondary | ICD-10-CM | POA: Diagnosis present

## 2020-07-19 DIAGNOSIS — M542 Cervicalgia: Secondary | ICD-10-CM | POA: Insufficient documentation

## 2020-07-19 DIAGNOSIS — M5030 Other cervical disc degeneration, unspecified cervical region: Secondary | ICD-10-CM | POA: Insufficient documentation

## 2020-07-19 DIAGNOSIS — M7062 Trochanteric bursitis, left hip: Secondary | ICD-10-CM

## 2020-07-19 IMAGING — MR MR CERVICAL SPINE W/O CM
5 series · 39 of 48 positions shown · non-contrast
Comparison: None.

CLINICAL DATA: Neck pain, right arm pain

EXAM:
MRI CERVICAL SPINE WITHOUT CONTRAST
TECHNIQUE: Multiplanar, multisequence MR imaging of the cervical spine was
performed. No intravenous contrast was administered.

[Series 5: T2 · sagittal · 3.0mm · 0.62mm/px · 7 of 15 slices shown (1 of 2)]
[im 1/15]
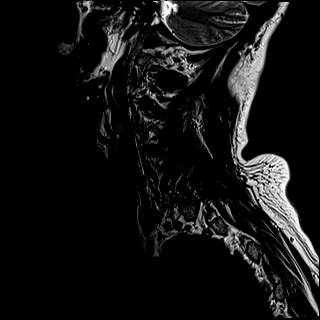
[im 3/15]
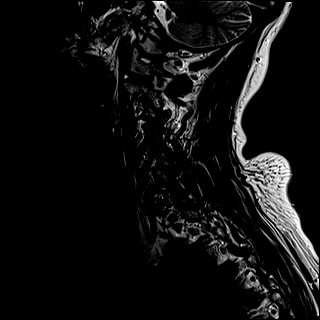
[im 5/15]
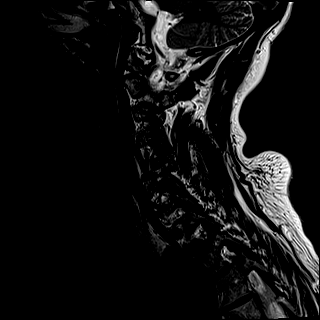
[im 8/15]
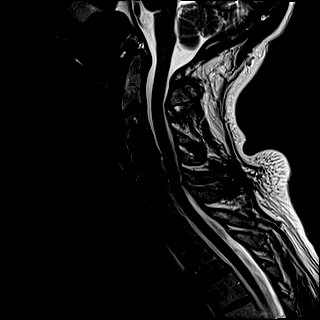
[im 10/15]
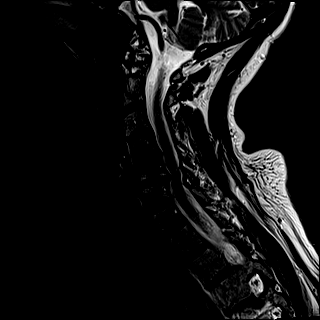
[im 12/15]
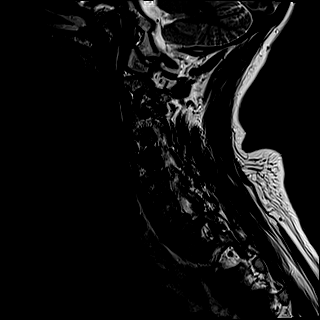
[im 15/15]
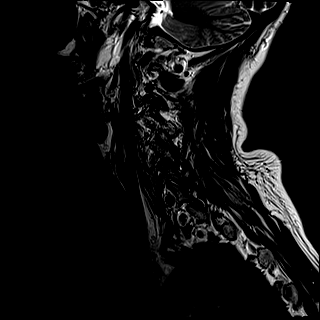

[Series 6: FLAIR · sagittal · 3.0mm · 0.78mm/px · 7 of 15 slices shown]
[im 1/15]
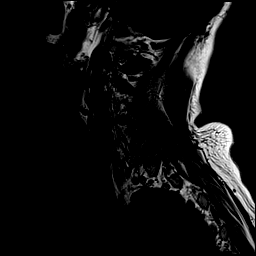
[im 3/15]
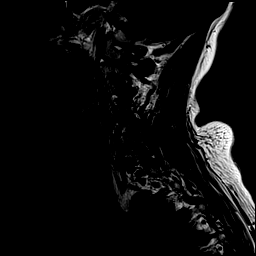
[im 5/15]
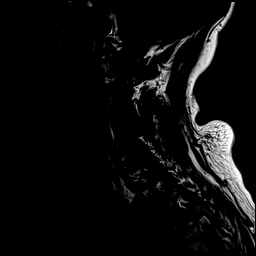
[im 8/15]
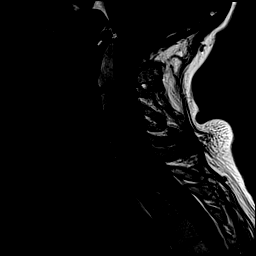
[im 10/15]
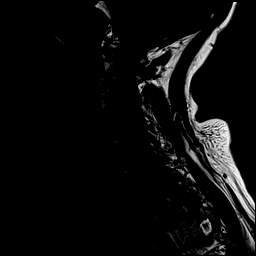
[im 12/15]
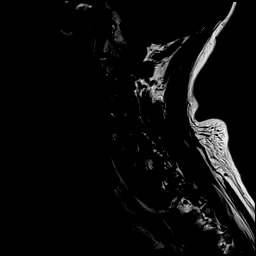
[im 15/15]
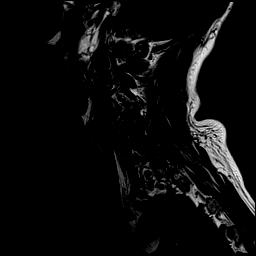

[Series 7: STIR · sagittal · 3.0mm · 0.62mm/px · 7 of 15 slices shown]
[im 1/15]
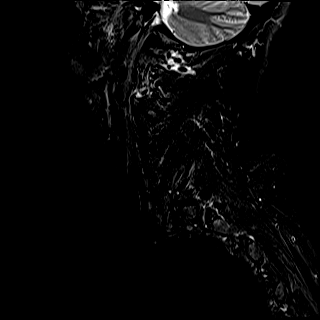
[im 3/15]
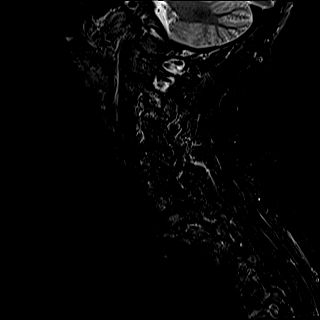
[im 5/15]
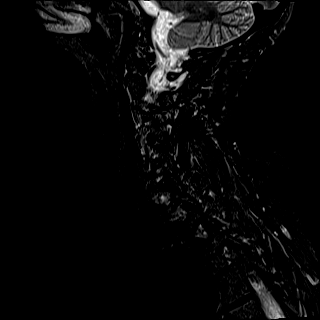
[im 8/15]
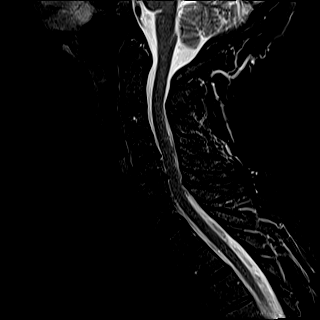
[im 10/15]
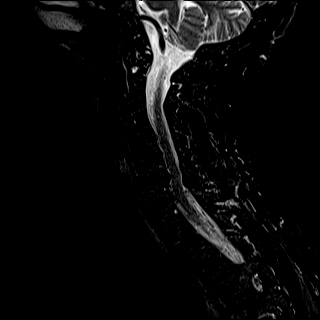
[im 12/15]
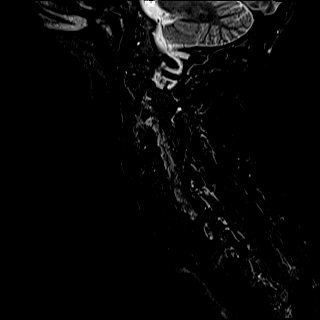
[im 15/15]
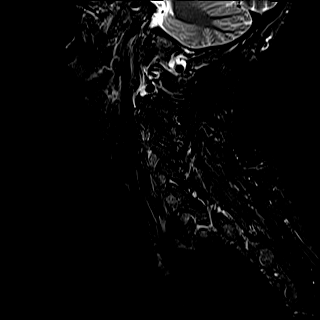

[Series 8: T2 · axial · 3.0mm · 0.70mm/px · z∈[-121,-26]mm · 10 of 27 slices shown (2 of 2)]
[im 1/27]
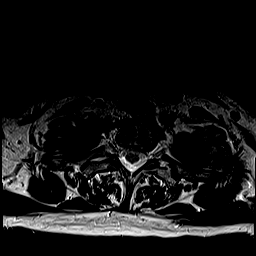
[im 3/27]
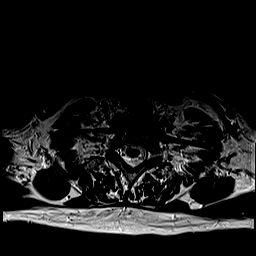
[im 5/27]
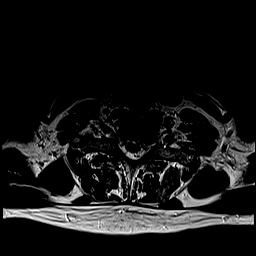
[im 9/27]
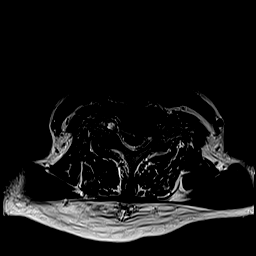
[im 11/27]
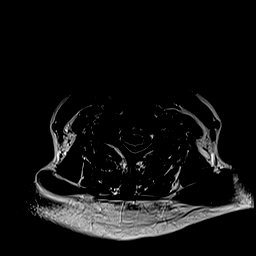
[im 14/27]
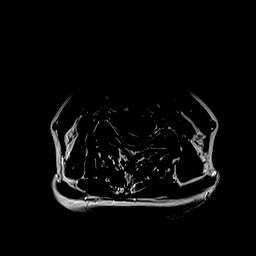
[im 16/27]
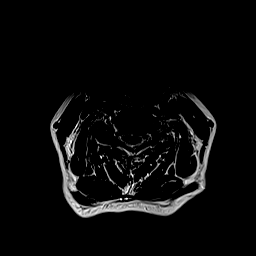
[im 18/27]
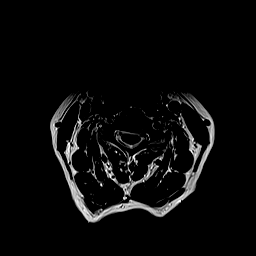
[im 22/27]
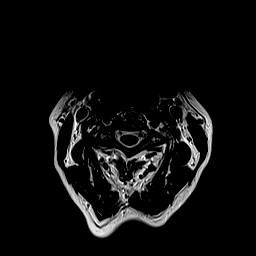
[im 27/27]
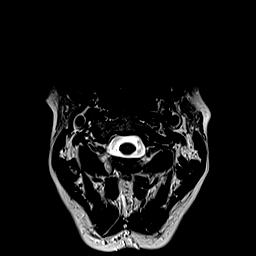

[Series 9: ax mpgr · axial · 3.0mm · 0.35mm/px · z∈[-121,-26]mm · 8 of 29 slices shown]
[im 1/29]
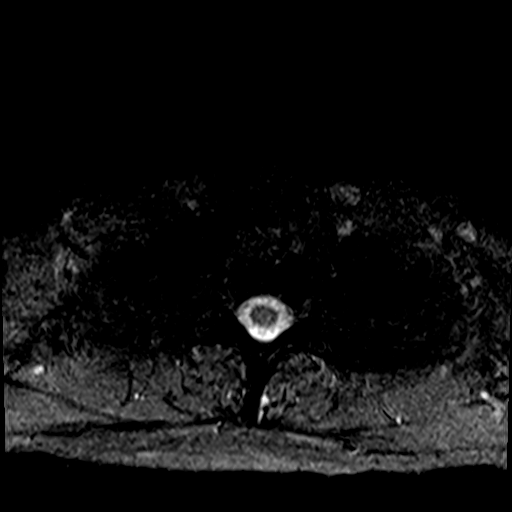
[im 5/29]
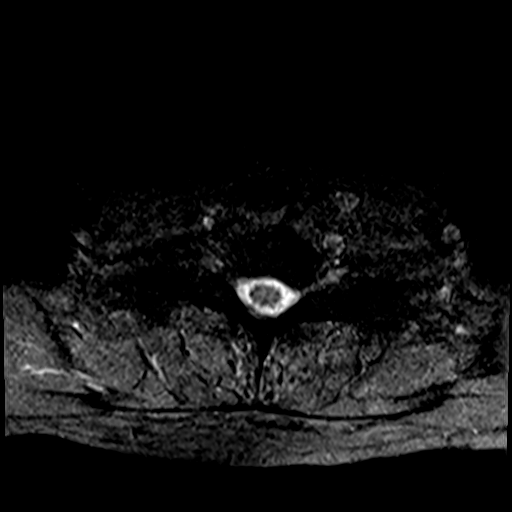
[im 9/29]
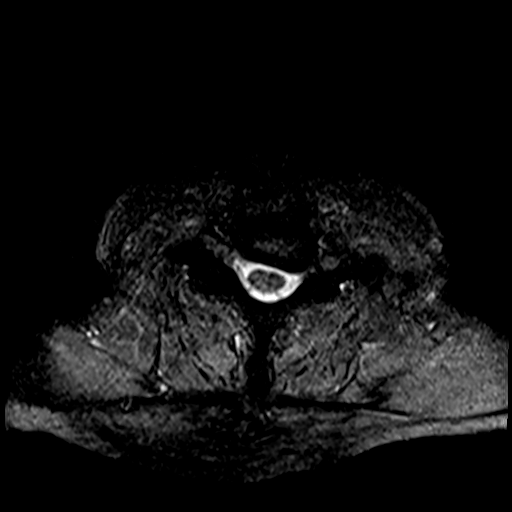
[im 13/29]
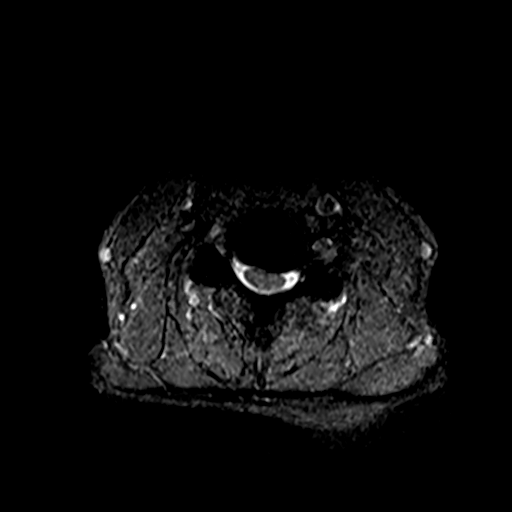
[im 16/29]
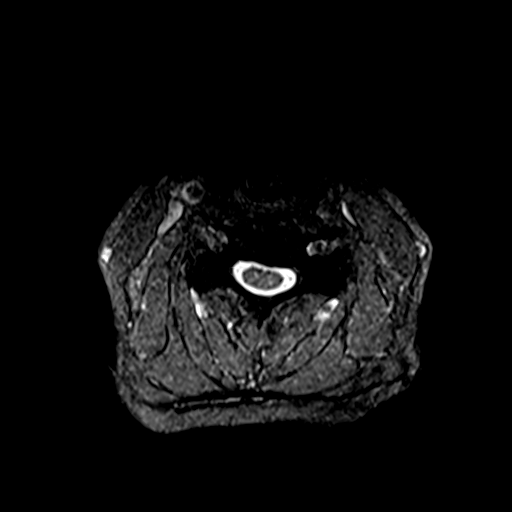
[im 20/29]
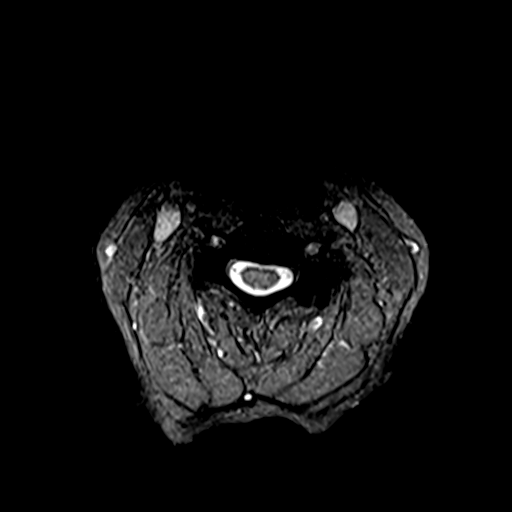
[im 24/29]
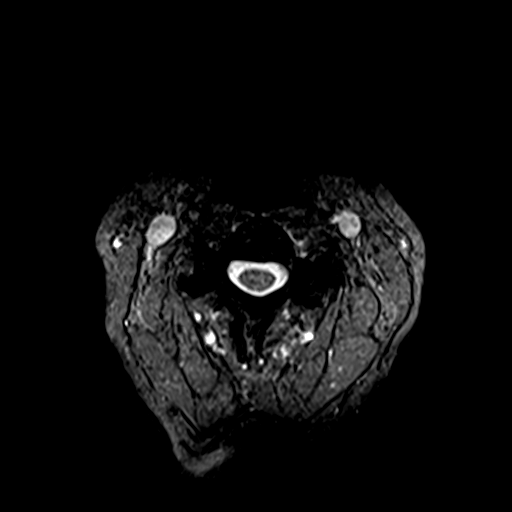
[im 29/29]
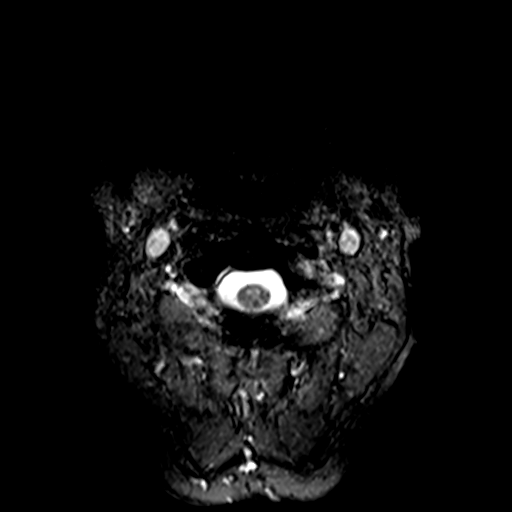

[39 of 48 positions shown; findings below may reference images not displayed]

FINDINGS: Alignment: No significant anteroposterior listhesis.

Vertebrae: Vertebral body heights are preserved apart from mild
degenerative plate irregularity primarily at C5-C6. Mild marrow
edema associated with right C2-C3 facets. No suspicious osseous
lesion.

Cord: Normal signal.

Posterior Fossa, vertebral arteries, paraspinal tissues:
Unremarkable.

Disc levels:

C2-C3: Facet hypertrophy. No canal or left foraminal stenosis. Mild
right foraminal stenosis.

C3-C4: Left facet hypertrophy. No canal or right foraminal stenosis.
Mild left foraminal stenosis.

C4-C5: Disc bulge with endplate osteophytes. Uncovertebral and facet
hypertrophy. No canal stenosis. Mild right foraminal stenosis. No
left foraminal stenosis.

C5-C6: Disc bulge with endplate osteophytes. Uncovertebral and facet
hypertrophy. Mild canal stenosis. Mild right foraminal stenosis. No
left foraminal stenosis.

C6-C6: Disc bulge with endplate osteophytes eccentric to the left.
Facet and left uncovertebral hypertrophy. No canal or right
foraminal stenosis. Minor left foraminal stenosis.

C7-T1: Disc bulge with endplate osteophytes eccentric to the left.
No canal or right foraminal stenosis. Minor left foraminal stenosis.
IMPRESSION: Multilevel degenerative changes as detailed above. No high-grade
canal stenosis. Right foraminal narrowing is present at C4-C5 and
C5-C6. Mild marrow edema associated with right C2-C3 facet
arthropathy.

## 2020-07-19 NOTE — Telephone Encounter (Signed)
-----   Message from Doreen Beam, 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  IMPRESSION: Multilevel degenerative changes as detailed above. No high-grade canal stenosis. Right foraminal narrowing is present at C4-C5 and C5-C6. Mild marrow edema associated with right C2-C3 facet arthropathy.

## 2020-07-19 NOTE — Progress Notes (Signed)
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  IMPRESSION: Multilevel degenerative changes as detailed above. No high-grade canal stenosis. Right foraminal narrowing is present at C4-C5 and C5-C6. Mild marrow edema associated with right C2-C3 facet arthropathy.

## 2020-07-19 NOTE — Telephone Encounter (Signed)
Pt called and was informed of results below. Pt is calling neurosurgery to make an appt. Pt would like to have an authorization to get MRI of hip as well.

## 2020-07-19 NOTE — Telephone Encounter (Signed)
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  IMPRESSION: Multilevel degenerative changes as detailed above. No high-grade canal stenosis. Right foraminal narrowing is present at C4-C5 and C5-C6. Mild marrow edema associated with right C2-C3 facet arthropathy.

## 2020-07-19 NOTE — Telephone Encounter (Signed)
Pt called but did not answer. VM left for pt to call back.

## 2020-07-20 ENCOUNTER — Other Ambulatory Visit: Payer: Self-pay | Admitting: Adult Health

## 2020-07-20 ENCOUNTER — Encounter: Payer: Self-pay | Admitting: Adult Health

## 2020-07-20 DIAGNOSIS — M7061 Trochanteric bursitis, right hip: Secondary | ICD-10-CM

## 2020-07-20 DIAGNOSIS — M25551 Pain in right hip: Secondary | ICD-10-CM

## 2020-07-20 DIAGNOSIS — M7062 Trochanteric bursitis, left hip: Secondary | ICD-10-CM

## 2020-07-20 DIAGNOSIS — M160 Bilateral primary osteoarthritis of hip: Secondary | ICD-10-CM | POA: Insufficient documentation

## 2020-07-20 DIAGNOSIS — M5431 Sciatica, right side: Secondary | ICD-10-CM

## 2020-07-20 NOTE — Telephone Encounter (Signed)
I contacted patient back as advised in message below, she states that she did not ask for a call back and states that she had written Mrs. Flinchum a message on mychart. Please see nurse response note below. KW

## 2020-07-20 NOTE — Telephone Encounter (Signed)
Pt given results; see telephone noted dated 07/19/20 at 1702; will close encounter.

## 2020-07-20 NOTE — Telephone Encounter (Signed)
Duplicate message, provider already responded via [facility].

## 2020-07-20 NOTE — Telephone Encounter (Signed)
Would advise referrals to orthopedics for hip pain that is persistent- x rays showed mild degeneration of both hips. Further imaging per orthopedics discretion.

## 2020-07-20 NOTE — Telephone Encounter (Signed)
lmtcb okay for [facility] triage nurse to advise patient of provider message below, referral has been placed in chart for ortho. KW

## 2020-07-20 NOTE — Telephone Encounter (Signed)
Pt given recommendations per Lisa Gonzalez, "Would advise referrals to orthopedics for hip pain that is persistent- x rays showed mild degeneration of both hips. Further imaging per orthopedics discretion"; the pt says her hip pain is acute (she can not sit for the past month; the pt says she is either walking, sitting, or llaying); the MRI done on her neck; the pt also said she emailed the provider today regarding her concerns; the pt can be contacted at [phone]; she would like to be called back to discuss this issue; will route to office for notification.

## 2020-07-20 NOTE — Progress Notes (Signed)
Orders Placed This Encounter  Procedures  . Ambulatory referral to Orthopedic Surgery  urgent.  Hip pain, bilateral - Plan: Ambulatory referral to Orthopedic Surgery  Right sided sciatica - Plan: Ambulatory referral to Orthopedic Surgery  Trochanteric bursitis of both hips - Plan: Ambulatory referral to Orthopedic Surgery  Osteoarthritis of both hips, unspecified osteoarthritis type- mild on bilateral hip vaccine.  - Plan: Ambulatory referral to Orthopedic Surgery

## 2020-07-30 ENCOUNTER — Other Ambulatory Visit: Payer: Self-pay | Admitting: Obstetrics and Gynecology

## 2020-07-30 DIAGNOSIS — Z79890 Hormone replacement therapy (postmenopausal): Secondary | ICD-10-CM

## 2020-07-30 DIAGNOSIS — N951 Menopausal and female climacteric states: Secondary | ICD-10-CM

## 2020-08-15 ENCOUNTER — Telehealth: Payer: Self-pay

## 2020-08-15 NOTE — Telephone Encounter (Signed)
Left message for patient to call office back to make appt

## 2020-08-15 NOTE — Telephone Encounter (Signed)
Pt calling; states her pharm has send request for estradiol patch which was denied; what to do?; also, can't get in to MyChart.  [phone]

## 2020-08-21 ENCOUNTER — Other Ambulatory Visit (HOSPITAL_COMMUNITY): Payer: Self-pay | Admitting: Orthopedic Surgery

## 2020-08-21 ENCOUNTER — Other Ambulatory Visit: Payer: Self-pay | Admitting: Orthopedic Surgery

## 2020-08-21 ENCOUNTER — Ambulatory Visit: Payer: 59 | Admitting: Obstetrics and Gynecology

## 2020-08-21 DIAGNOSIS — M545 Low back pain, unspecified: Secondary | ICD-10-CM

## 2020-08-21 DIAGNOSIS — M5416 Radiculopathy, lumbar region: Secondary | ICD-10-CM

## 2020-08-21 NOTE — Progress Notes (Addendum)
PCP: Doreen Beam, FNP   Chief Complaint  Patient presents with  . HRT  . Breast Exam    Lump under left arm, not painful x few months    HPI:      Ms. MICAL BRUN is a 60 y.o. [id number] whose LMP was No LMP recorded. Patient has had a hysterectomy., presents today for her annual examination.  Her menses are absent due to menopause/hyst for endometriosis. No PMB.   She denies hot flashes but has episodic night sweats, usually occur with insomnia/waking with anxiety/panic attacks. Sx intermittent, occur for a few days then resolve. Pt on vivelle dot 0.075 mg. Tried prometrium last yr for insomnia but pt had nightmares so stopped it. She is seeing therapist for anxiety but thinks she may need meds. Did xanax in past without much improvement, no relief with zoloft and very hard to come off it. Hesitant to do SSRI again, hasn't tried buspar. Pt taking melatonin and sleep meds, Rxd by PCP for insomnia.  Exercise has helped mental health.   Sex activity: single partner, contraception - status post hysterectomy. She does not have vaginal dryness.  Last Pap: N/A due to hyst; no hx of abn paps.  Hx of STDs: none  Last mammogram: 10/01/19  Results were: normal--routine follow-up in 12 months. Seen for RT breast mass 12/21; benign cyst on mammo/ u/s. Pt with hx of lumpy breasts.  There is no FH of breast cancer. There is no FH of ovarian cancer. The patient does do self-breast exams. Pt noticed a firm lump under her LT axilla a few months ago, no change in size. No pain.   Colonoscopy: 2019  Repeat due after 5 years. FH colon cancer in her MGM.  Tobacco use: The patient denies current or previous tobacco use. Alcohol use: none  No drug use Exercise: very active  She does get adequate calcium and Vitamin D in her diet.  Labs with PCP.   Past Medical History:  Diagnosis Date  . Allergy   . Anxiety   . Back pain   . Cyst of left breast   . GERD (gastroesophageal reflux  disease)   . History of endometriosis   . History of ovarian cyst   . PTSD (post-traumatic stress disorder)     Past Surgical History:  Procedure Laterality Date  . ABDOMINAL HYSTERECTOMY  2019  . SPINAL FUSION  2018  . SPINE SURGERY      Family History  Problem Relation Age of Onset  . Autoimmune disease Mother   . Heart attack Father 47  . Cancer - Colon Maternal Grandmother   . Alzheimer's disease Maternal Grandmother   . Heart attack Maternal Grandfather   . Bone cancer Paternal Grandmother     Social History   Socioeconomic History  . Marital status: Married    Spouse name: Not on file  . Number of children: Not on file  . Years of education: Not on file  . Highest education level: Not on file  Occupational History  . Not on file  Tobacco Use  . Smoking status: Never Smoker  . Smokeless tobacco: Never Used  Vaping Use  . Vaping Use: Never used  Substance and Sexual Activity  . Alcohol use: Yes    Alcohol/week: 1.0 standard drink    Types: 1 Glasses of wine per week  . Drug use: Never  . Sexual activity: Yes    Birth control/protection: None, Surgical    Comment: Hysterectomy  Other Topics Concern  . Not on file  Social History Narrative  . Not on file   Social Determinants of Health   Financial Resource Strain: Not on file  Food Insecurity: Not on file  Transportation Needs: Not on file  Physical Activity: Not on file  Stress: Not on file  Social Connections: Not on file  Intimate Partner Violence: Not on file     Current Outpatient Medications:  .  ALPRAZolam (XANAX) 0.5 MG tablet, Take 1 tablet (0.5 mg total) by mouth 3 (three) times daily., Disp: 90 tablet, Rfl: 0 .  clotrimazole-betamethasone (LOTRISONE) cream, Apply externally BID for 2 wks, Disp: 15 g, Rfl: 0 .  eszopiclone 3 MG TABS, Take 1 tablet (3 mg total) by mouth at bedtime as needed for sleep. May try 1/2 tablet first to see effective before progressing to full tablet., Disp: 30  tablet, Rfl: 1 .  loratadine (CLARITIN) 10 MG tablet, Take by mouth., Disp: , Rfl:  .  methocarbamol (ROBAXIN) 500 MG tablet, Take 1 tablet (500 mg total) by mouth 4 (four) times daily., Disp: 60 tablet, Rfl: 0 .  omeprazole (PRILOSEC) 40 MG capsule, Take 40 mg by mouth 2 (two) times daily., Disp: , Rfl:  .  SUMAtriptan (IMITREX) 100 MG tablet, Take by mouth., Disp: , Rfl:  .  [START ON 08/24/2020] estradiol (VIVELLE-DOT) 0.075 MG/24HR, Place 1 patch onto the skin 2 (two) times a week., Disp: 24 patch, Rfl: 3     ROS:  Review of Systems  Constitutional: Negative for fatigue, fever and unexpected weight change.  Respiratory: Negative for cough, shortness of breath and wheezing.   Cardiovascular: Negative for chest pain, palpitations and leg swelling.  Gastrointestinal: Negative for blood in stool, constipation, diarrhea, nausea and vomiting.  Endocrine: Negative for cold intolerance, heat intolerance and polyuria.  Genitourinary: Negative for dyspareunia, dysuria, flank pain, frequency, genital sores, hematuria, menstrual problem, pelvic pain, urgency, vaginal bleeding, vaginal discharge and vaginal pain.  Musculoskeletal: Negative for back pain, joint swelling and myalgias.  Skin: Negative for rash.  Neurological: Negative for dizziness, syncope, light-headedness, numbness and headaches.  Hematological: Negative for adenopathy.  Psychiatric/Behavioral: Positive for agitation and sleep disturbance. Negative for confusion and suicidal ideas. The patient is not nervous/anxious.   BREAST: No symptoms    Objective: BP 90/60   Ht 5\' 8"  (1.727 m)   Wt 150 lb (68 kg)   BMI 22.81 kg/m    Physical Exam Constitutional:      Appearance: She is well-developed.  Genitourinary:     Vulva normal.     Genitourinary Comments: UTERUS/CX SURG REM     Right Labia: No tenderness, lesions or skin changes.    Left Labia: No tenderness, lesions or skin changes.    Vaginal cuff intact.    No  vaginal discharge, erythema or tenderness.      Right Adnexa: not tender and no mass present.    Left Adnexa: not tender and no mass present.    Cervix is absent.     Uterus is not tender.     Uterus is absent.  Breasts:     Right: No mass, nipple discharge, skin change or tenderness.     Left: No mass, nipple discharge, skin change or tenderness.    Neck:     Thyroid: No thyromegaly.  Cardiovascular:     Rate and Rhythm: Normal rate and regular rhythm.     Heart sounds: Normal heart sounds. No murmur heard.   Pulmonary:  Effort: Pulmonary effort is normal.     Breath sounds: Normal breath sounds.  Chest:    Abdominal:     Palpations: Abdomen is soft.     Tenderness: There is no abdominal tenderness. There is no guarding.  Musculoskeletal:        General: Normal range of motion.     Cervical back: Normal range of motion.  Neurological:     General: No focal deficit present.     Mental Status: She is alert and oriented to person, place, and time.     Cranial Nerves: No cranial nerve deficit.  Skin:    General: Skin is warm and dry.  Psychiatric:        Mood and Affect: Mood normal.        Behavior: Behavior normal.        Thought Content: Thought content normal.        Judgment: Judgment normal.  Vitals reviewed.     Assessment/Plan:  Encounter for annual routine gynecological examination  Encounter for screening mammogram for malignant neoplasm of breast - Plan: MM 3D SCREEN BREAST BILATERAL; pt to sched mammo  Hormone replacement therapy (HRT) - Plan: estradiol (VIVELLE-DOT) 0.075 MG/24HR; Rx RF.   Vasomotor symptoms due to menopause - Plan: estradiol (VIVELLE-DOT) 0.075 MG/24HR; Cont Vivelle dot 0.075. Discussed treating anxiety which may help with insomnia and night sweats. F/u prn.   Anxiety--Discussed HRT is not used to treat anxiety/depression sx, even if postmenopausal. Pt hesitant to retry SSRI. May do well with buspar. F/u with PCP  Other  insomnia  Axillary mass, left--benign blocked gland. Warm compresses/leave alone. Reassurance. F/u prn.    Meds ordered this encounter  Medications  . estradiol (VIVELLE-DOT) 0.075 MG/24HR    Sig: Place 1 patch onto the skin 2 (two) times a week.    Dispense:  24 patch    Refill:  3    Order Specific Question:   Supervising Provider    Answer:   Gae Dry [[id number]]            GYN counsel breast self exam, mammography screening, use and side effects of HRT, menopause, adequate intake of calcium and vitamin D, diet and exercise    F/U  Return in about 1 year (around 08/22/2021).  Chaston Bradburn B. Calen Geister, PA-C 08/22/2020 10:09 AM

## 2020-08-22 ENCOUNTER — Encounter: Payer: Self-pay | Admitting: Obstetrics and Gynecology

## 2020-08-22 ENCOUNTER — Ambulatory Visit (INDEPENDENT_AMBULATORY_CARE_PROVIDER_SITE_OTHER): Payer: 59 | Admitting: Obstetrics and Gynecology

## 2020-08-22 ENCOUNTER — Other Ambulatory Visit: Payer: Self-pay

## 2020-08-22 VITALS — BP 90/60 | Ht 68.0 in | Wt 150.0 lb

## 2020-08-22 DIAGNOSIS — Z01419 Encounter for gynecological examination (general) (routine) without abnormal findings: Secondary | ICD-10-CM

## 2020-08-22 DIAGNOSIS — F419 Anxiety disorder, unspecified: Secondary | ICD-10-CM

## 2020-08-22 DIAGNOSIS — Z79890 Hormone replacement therapy (postmenopausal): Secondary | ICD-10-CM | POA: Diagnosis not present

## 2020-08-22 DIAGNOSIS — Z1231 Encounter for screening mammogram for malignant neoplasm of breast: Secondary | ICD-10-CM | POA: Diagnosis not present

## 2020-08-22 DIAGNOSIS — N951 Menopausal and female climacteric states: Secondary | ICD-10-CM | POA: Diagnosis not present

## 2020-08-22 DIAGNOSIS — G4709 Other insomnia: Secondary | ICD-10-CM

## 2020-08-22 DIAGNOSIS — R2232 Localized swelling, mass and lump, left upper limb: Secondary | ICD-10-CM

## 2020-08-22 MED ORDER — ESTRADIOL 0.075 MG/24HR TD PTTW: 1.0000 | MEDICATED_PATCH | TRANSDERMAL | 3 refills | Status: DC

## 2020-08-22 NOTE — Patient Instructions (Signed)
I value your feedback and you entrusting us with your care. If you get a [facility] patient survey, I would appreciate you taking the time to let us know about your experience today. Thank you!  [facility]: [phone]      

## 2020-08-29 ENCOUNTER — Other Ambulatory Visit: Payer: Self-pay | Admitting: Adult Health

## 2020-08-29 DIAGNOSIS — M62838 Other muscle spasm: Secondary | ICD-10-CM

## 2020-08-29 NOTE — Telephone Encounter (Signed)
Requested medication (s) are due for refill today: yes   Requested medication (s) are on the active medication list: yes  Last refill:  04/11/2020  Future visit scheduled:yes  Notes to clinic: this refill cannot be delegated    Requested Prescriptions  Pending Prescriptions Disp Refills   Eszopiclone 3 MG TABS [Pharmacy Med Name: ESZOPICLONE 3 MG TABLET] 30 tablet     Sig: TAKE 1 TAB AT BEDTIME AS NEEDED FOR SLEEP. MAY TRY 1/2 TAB 1ST TO SEE EFFECTIVE BEFORE FULL TABLET.      Not Delegated - Psychiatry:  Anxiolytics/Hypnotics Failed - 08/29/2020  8:46 AM      Failed - This refill cannot be delegated      Failed - Urine Drug Screen completed in last 360 days      Passed - Valid encounter within last 6 months    Recent Outpatient Visits           1 month ago Neck pain   [place] Flinchum, Kelby Aline, FNP   2 months ago Trochanteric bursitis of both hips   [place], Clearnce Sorrel, [place]   1 year ago Encounter for routine adult health examination without abnormal findings   [place], FNP       Future Appointments             In 1 week Flinchum, Kelby Aline, [place], [place]

## 2020-08-29 NOTE — Telephone Encounter (Signed)
Requested medication (s) are due for refill today:yes  Requested medication (s) are on the active medication list: yes  Last refill: 07/05/20  #60  0 refills  Future visit scheduled  yes 09/08/20  Notes to clinic:not delegated  Requested Prescriptions  Pending Prescriptions Disp Refills   methocarbamol (ROBAXIN) 500 MG tablet [Pharmacy Med Name: METHOCARBAMOL 500 MG TABLET] 60 tablet 0    Sig: TAKE 1 TABLET BY MOUTH 4 TIMES DAILY.      Not Delegated - Analgesics:  Muscle Relaxants Failed - 08/29/2020  8:43 AM      Failed - This refill cannot be delegated      Passed - Valid encounter within last 6 months    Recent Outpatient Visits           1 month ago Neck pain   [facility] Gonzalez, Lisa Aline, FNP   2 months ago Trochanteric bursitis of both hips   Lisa Gonzalez, Lisa Gonzalez, Lisa Gonzalez   1 year ago Encounter for routine adult health examination without abnormal findings   [place], FNP       Future Appointments             In 1 week Gonzalez, Lisa Gonzalez, [place], [place]

## 2020-09-03 ENCOUNTER — Other Ambulatory Visit: Payer: Self-pay

## 2020-09-03 ENCOUNTER — Ambulatory Visit
Admission: RE | Admit: 2020-09-03 | Discharge: 2020-09-03 | Disposition: A | Discharge status: Home / Self Care | Payer: 59 | Source: Ambulatory Visit | Attending: Orthopedic Surgery | Admitting: Orthopedic Surgery

## 2020-09-03 DIAGNOSIS — M545 Low back pain, unspecified: Secondary | ICD-10-CM | POA: Insufficient documentation

## 2020-09-03 DIAGNOSIS — M5416 Radiculopathy, lumbar region: Secondary | ICD-10-CM | POA: Insufficient documentation

## 2020-09-08 ENCOUNTER — Other Ambulatory Visit: Payer: Self-pay

## 2020-09-08 ENCOUNTER — Encounter: Payer: Self-pay | Admitting: Adult Health

## 2020-09-08 ENCOUNTER — Ambulatory Visit (INDEPENDENT_AMBULATORY_CARE_PROVIDER_SITE_OTHER): Payer: 59 | Admitting: Adult Health

## 2020-09-08 VITALS — BP 93/62 | HR 65 | Temp 97.7°F | Resp 16 | Ht 68.0 in | Wt 146.0 lb

## 2020-09-08 DIAGNOSIS — R232 Flushing: Secondary | ICD-10-CM | POA: Diagnosis not present

## 2020-09-08 DIAGNOSIS — Z1159 Encounter for screening for other viral diseases: Secondary | ICD-10-CM

## 2020-09-08 DIAGNOSIS — Z114 Encounter for screening for human immunodeficiency virus [HIV]: Secondary | ICD-10-CM

## 2020-09-08 DIAGNOSIS — G8929 Other chronic pain: Secondary | ICD-10-CM

## 2020-09-08 DIAGNOSIS — Z0000 Encounter for general adult medical examination without abnormal findings: Secondary | ICD-10-CM | POA: Diagnosis not present

## 2020-09-08 DIAGNOSIS — M7062 Trochanteric bursitis, left hip: Secondary | ICD-10-CM

## 2020-09-08 DIAGNOSIS — G4700 Insomnia, unspecified: Secondary | ICD-10-CM

## 2020-09-08 DIAGNOSIS — E559 Vitamin D deficiency, unspecified: Secondary | ICD-10-CM

## 2020-09-08 DIAGNOSIS — M542 Cervicalgia: Secondary | ICD-10-CM

## 2020-09-08 DIAGNOSIS — G5700 Lesion of sciatic nerve, unspecified lower limb: Secondary | ICD-10-CM

## 2020-09-08 DIAGNOSIS — M7061 Trochanteric bursitis, right hip: Secondary | ICD-10-CM

## 2020-09-08 DIAGNOSIS — M62838 Other muscle spasm: Secondary | ICD-10-CM

## 2020-09-08 LAB — POCT URINALYSIS DIPSTICK
Bilirubin, UA: NEGATIVE
Blood, UA: NEGATIVE
Glucose, UA: NEGATIVE
Ketones, UA: NEGATIVE
Leukocytes, UA: NEGATIVE
Nitrite, UA: NEGATIVE
Protein, UA: NEGATIVE
Spec Grav, UA: <=1.005 — AB (ref 1.010–1.025)
Urobilinogen, UA: 0.2 E.U./dL
pH, UA: 5 (ref 5.0–8.0)

## 2020-09-08 MED ORDER — METHOCARBAMOL 500 MG PO TABS: 500.0000 mg | ORAL_TABLET | Freq: Four times a day (QID) | ORAL | 3 refills | Status: DC

## 2020-09-08 NOTE — Patient Instructions (Addendum)
HealthCare at [facility] in [place], [place] Address: [address], [place], [place] [address] Phone: [phone]   Health Maintenance, Female Adopting a healthy lifestyle and getting preventive care are important in promoting health and wellness. Ask your health care provider about:  The right schedule for you to have regular tests and exams.  Things you can do on your own to prevent diseases and keep yourself healthy. What should I know about diet, weight, and exercise? Eat a healthy diet  Eat a diet that includes plenty of vegetables, fruits, low-fat dairy products, and lean protein.  Do not eat a lot of foods that are high in solid fats, added sugars, or sodium.   Maintain a healthy weight Body mass index (BMI) is used to identify weight problems. It estimates body fat based on height and weight. Your health care provider can help determine your BMI and help you achieve or maintain a healthy weight. Get regular exercise Get regular exercise. This is one of the most important things you can do for your health. Most adults should:  Exercise for at least 150 minutes each week. The exercise should increase your heart rate and make you sweat (moderate-intensity exercise).  Do strengthening exercises at least twice a week. This is in addition to the moderate-intensity exercise.  Spend less time sitting. Even light physical activity can be beneficial. Watch cholesterol and blood lipids Have your blood tested for lipids and cholesterol at 60 years of age, then have this test every 5 years. Have your cholesterol levels checked more often if:  Your lipid or cholesterol levels are high.  You are older than 60 years of age.  You are at high risk for heart disease. What should I know about cancer screening? Depending on your health history and family history, you may need to have cancer screening at various ages. This may include screening  for:  Breast cancer.  Cervical cancer.  Colorectal cancer.  Skin cancer.  Lung cancer. What should I know about heart disease, diabetes, and high blood pressure? Blood pressure and heart disease  High blood pressure causes heart disease and increases the risk of stroke. This is more likely to develop in people who have high blood pressure readings, are of African descent, or are overweight.  Have your blood pressure checked: ? Every 3-5 years if you are 18-39 years of age. ? Every year if you are 40 years old or older. Diabetes Have regular diabetes screenings. This checks your fasting blood sugar level. Have the screening done:  Once every three years after age 40 if you are at a normal weight and have a low risk for diabetes.  More often and at a younger age if you are overweight or have a high risk for diabetes. What should I know about preventing infection? Hepatitis B If you have a higher risk for hepatitis B, you should be screened for this virus. Talk with your health care provider to find out if you are at risk for hepatitis B infection. Hepatitis C Testing is recommended for:  Everyone born from 1945 through 1965.  Anyone with known risk factors for hepatitis C. Sexually transmitted infections (STIs)  Get screened for STIs, including gonorrhea and chlamydia, if: ? You are sexually active and are younger than 60 years of age. ? You are older than 60 years of age and your health care provider tells you that you are at risk for this type of infection. ? Your sexual   activity has changed since you were last screened, and you are at increased risk for chlamydia or gonorrhea. Ask your health care provider if you are at risk.  Ask your health care provider about whether you are at high risk for HIV. Your health care provider may recommend a prescription medicine to help prevent HIV infection. If you choose to take medicine to prevent HIV, you should first get tested for HIV.  You should then be tested every 3 months for as long as you are taking the medicine. Pregnancy  If you are about to stop having your period (premenopausal) and you may become pregnant, seek counseling before you get pregnant.  Take 400 to 800 micrograms (mcg) of folic acid every day if you become pregnant.  Ask for birth control (contraception) if you want to prevent pregnancy. Osteoporosis and menopause Osteoporosis is a disease in which the bones lose minerals and strength with aging. This can result in bone fractures. If you are 12 years old or older, or if you are at risk for osteoporosis and fractures, ask your health care provider if you should:  Be screened for bone loss.  Take a calcium or vitamin D supplement to lower your risk of fractures.  Be given hormone replacement therapy (HRT) to treat symptoms of menopause. Follow these instructions at home: Lifestyle  Do not use any products that contain nicotine or tobacco, such as cigarettes, e-cigarettes, and chewing tobacco. If you need help quitting, ask your health care provider.  Do not use street drugs.  Do not share needles.  Ask your health care provider for help if you need support or information about quitting drugs. Alcohol use  Do not drink alcohol if: ? Your health care provider tells you not to drink. ? You are pregnant, may be pregnant, or are planning to become pregnant.  If you drink alcohol: ? Limit how much you use to 0-1 drink a day. ? Limit intake if you are breastfeeding.  Be aware of how much alcohol is in your drink. In the [place] one drink equals one 12 oz bottle of beer (355 mL), one 5 oz glass of wine (148 mL), or one 1 oz glass of hard liquor (44 mL). General instructions  Schedule regular health, dental, and eye exams.  Stay current with your vaccines.  Tell your health care provider if: ? You often feel depressed. ? You have ever been abused or do not feel safe at  home. Summary  Adopting a healthy lifestyle and getting preventive care are important in promoting health and wellness.  Follow your health care provider's instructions about healthy diet, exercising, and getting tested or screened for diseases.  Follow your health care provider's instructions on monitoring your cholesterol and blood pressure. This information is not intended to replace advice given to you by your health care provider. Make sure you discuss any questions you have with your health care provider. Document Revised: 05/06/2018 Document Reviewed: 05/06/2018 Elsevier Patient Education  2021 Elsevier Inc. Preventive Care 69-68 Years Old, Female Preventive care refers to lifestyle choices and visits with your health care provider that can promote health and wellness. This includes:  A yearly physical exam. This is also called an annual wellness visit.  Regular dental and eye exams.  Immunizations.  Screening for certain conditions.  Healthy lifestyle choices, such as: ? Eating a healthy diet. ? Getting regular exercise. ? Not using drugs or products that contain nicotine and tobacco. ? Limiting alcohol use. What can  I expect for my preventive care visit? Physical exam Your health care provider will check your:  Height and weight. These may be used to calculate your BMI (body mass index). BMI is a measurement that tells if you are at a healthy weight.  Heart rate and blood pressure.  Body temperature.  Skin for abnormal spots. Counseling Your health care provider may ask you questions about your:  Past medical problems.  Family's medical history.  Alcohol, tobacco, and drug use.  Emotional well-being.  Home life and relationship well-being.  Sexual activity.  Diet, exercise, and sleep habits.  Work and work environment.  Access to firearms.  Method of birth control.  Menstrual cycle.  Pregnancy history. What immunizations do I need? Vaccines  are usually given at various ages, according to a schedule. Your health care provider will recommend vaccines for you based on your age, medical history, and lifestyle or other factors, such as travel or where you work.   What tests do I need? Blood tests  Lipid and cholesterol levels. These may be checked every 5 years, or more often if you are over 50 years old.  Hepatitis C test.  Hepatitis B test. Screening  Lung cancer screening. You may have this screening every year starting at age 55 if you have a 30-pack-year history of smoking and currently smoke or have quit within the past 15 years.  Colorectal cancer screening. ? All adults should have this screening starting at age 50 and continuing until age 75. ? Your health care provider may recommend screening at age 45 if you are at increased risk. ? You will have tests every 1-10 years, depending on your results and the type of screening test.  Diabetes screening. ? This is done by checking your blood sugar (glucose) after you have not eaten for a while (fasting). ? You may have this done every 1-3 years.  Mammogram. ? This may be done every 1-2 years. ? Talk with your health care provider about when you should start having regular mammograms. This may depend on whether you have a family history of breast cancer.  BRCA-related cancer screening. This may be done if you have a family history of breast, ovarian, tubal, or peritoneal cancers.  Pelvic exam and Pap test. ? This may be done every 3 years starting at age 21. ? Starting at age 30, this may be done every 5 years if you have a Pap test in combination with an HPV test. Other tests  STD (sexually transmitted disease) testing, if you are at risk.  Bone density scan. This is done to screen for osteoporosis. You may have this scan if you are at high risk for osteoporosis. Talk with your health care provider about your test results, treatment options, and if necessary, the need  for more tests. Follow these instructions at home: Eating and drinking  Eat a diet that includes fresh fruits and vegetables, whole grains, lean protein, and low-fat dairy products.  Take vitamin and mineral supplements as recommended by your health care provider.  Do not drink alcohol if: ? Your health care provider tells you not to drink. ? You are pregnant, may be pregnant, or are planning to become pregnant.  If you drink alcohol: ? Limit how much you have to 0-1 drink a day. ? Be aware of how much alcohol is in your drink. In the [place] one drink equals one 12 oz bottle of beer (355 mL), one 5 oz glass of wine (148   mL), or one 1 oz glass of hard liquor (44 mL).   Lifestyle  Take daily care of your teeth and gums. Brush your teeth every morning and night with fluoride toothpaste. Floss one time each day.  Stay active. Exercise for at least 30 minutes 5 or more days each week.  Do not use any products that contain nicotine or tobacco, such as cigarettes, e-cigarettes, and chewing tobacco. If you need help quitting, ask your health care provider.  Do not use drugs.  If you are sexually active, practice safe sex. Use a condom or other form of protection to prevent STIs (sexually transmitted infections).  If you do not wish to become pregnant, use a form of birth control. If you plan to become pregnant, see your health care provider for a prepregnancy visit.  If told by your health care provider, take low-dose aspirin daily starting at age 67.  Find healthy ways to cope with stress, such as: ? Meditation, yoga, or listening to music. ? Journaling. ? Talking to a trusted person. ? Spending time with friends and family. Safety  Always wear your seat belt while driving or riding in a vehicle.  Do not drive: ? If you have been drinking alcohol. Do not ride with someone who has been drinking. ? When you are tired or distracted. ? While texting.  Wear a helmet and other  protective equipment during sports activities.  If you have firearms in your house, make sure you follow all gun safety procedures. What's next?  Visit your health care provider once a year for an annual wellness visit.  Ask your health care provider how often you should have your eyes and teeth checked.  Stay up to date on all vaccines. This information is not intended to replace advice given to you by your health care provider. Make sure you discuss any questions you have with your health care provider. Document Revised: 02/15/2020 Document Reviewed: 01/22/2018 Elsevier Patient Education  2021 [employer].

## 2020-09-08 NOTE — Progress Notes (Signed)
Complete physical exam   Patient: Lisa Gonzalez   DOB: 1960-07-12   60 y.o. Female  MRN: [id number] Visit Date: 09/08/2020  Today's healthcare provider: Marcille Buffy, FNP   Chief Complaint  Patient presents with  . Annual Exam   Subjective    Lisa Gonzalez is a 60 y.o. female who presents today for a complete physical exam.  She reports consuming a general diet. Patient reports that she exercises 4x a week She generally feels fairly well. She reports sleeping poorly, patient reports difficulty falling and staying asleep. She does have additional problems to discuss today, patient states that she would like to address chronic back pain.  HPI  Last mammogram: 10/01/19  Results were: normal--routine follow-up in 12 months. Seen for RT breast mass 12/21; benign cyst on mammo/ u/s. Pt with hx of lumpy breasts.  There is no FH of breast cancer. There is no FH of ovarian cancer. The patient does do self-breast exams. colonoscopy 2019 repeat in 5 years colon cancer in mother.  2024 due.   Dr. Cari Caraway  Neurosurgeon had injection in neck.   Dr. Marica Otter for injection in right hip  Doing better.  Upper buttock pain she has had this in the past, and she had resolved this with training in the past.   Tawanna Sat had  X ray per patient- CD - send Madaline Guthrie   taking Diclofenac daily as well.    Patient  denies any fever, chills, rash, chest pain, shortness of breath, nausea, vomiting, or diarrhea.   Denies dizziness, lightheadedness, pre syncopal or syncopal episodes.    Past Medical History:  Diagnosis Date  . Allergy   . Anxiety   . Back pain   . Cyst of left breast   . GERD (gastroesophageal reflux disease)   . History of endometriosis   . History of ovarian cyst   . PTSD (post-traumatic stress disorder)    Past Surgical History:  Procedure Laterality Date  . ABDOMINAL HYSTERECTOMY  2019  . SPINAL FUSION  2018  . SPINE SURGERY     Social History    Socioeconomic History  . Marital status: Married    Spouse name: Not on file  . Number of children: Not on file  . Years of education: Not on file  . Highest education level: Not on file  Occupational History  . Not on file  Tobacco Use  . Smoking status: Never Smoker  . Smokeless tobacco: Never Used  Vaping Use  . Vaping Use: Never used  Substance and Sexual Activity  . Alcohol use: Yes    Alcohol/week: 1.0 standard drink    Types: 1 Glasses of wine per week  . Drug use: Never  . Sexual activity: Yes    Birth control/protection: None, Surgical    Comment: Hysterectomy  Other Topics Concern  . Not on file  Social History Narrative  . Not on file   Social Determinants of Health   Financial Resource Strain: Not on file  Food Insecurity: Not on file  Transportation Needs: Not on file  Physical Activity: Not on file  Stress: Not on file  Social Connections: Not on file  Intimate Partner Violence: Not on file   Family Status  Relation Name Status  . Mother  Alive  . Father  Alive  . MGM  Deceased  . MGF  Deceased  . PGM  Deceased  . PGF  Deceased   Family History  Problem Relation  Age of Onset  . Autoimmune disease Mother   . Heart attack Father 61  . Cancer - Colon Maternal Grandmother   . Alzheimer's disease Maternal Grandmother   . Heart attack Maternal Grandfather   . Bone cancer Paternal Grandmother    Allergies  Allergen Reactions  . Meperidine Swelling and Nausea Only    Blood Pressure Dropped Low BP and pass out.   Marland Kitchen Hydrocodone Nausea And Vomiting, Swelling and Nausea Only    Hot and cold chills, vomiting, headache, and chest pain  Other reaction(s): Vomiting Hot and cold chills, headache, chest pain  . Hydromorphone Nausea And Vomiting and Nausea Only    Other reaction(s): Vomiting  . Opium Other (See Comments)    Hot and cold chills, vomiting, headache and chest pain  . Oxycodone Nausea And Vomiting and Nausea Only    Other reaction(s):  Other (comments) Hot and cold chills, headache, vomiting, and chest pain.     Patient Care Team: Meghanne Pletz, Kelby Aline, FNP as PCP - General (Family Medicine)   Medications: Outpatient Medications Prior to Visit  Medication Sig  . ALPRAZolam (XANAX) 0.5 MG tablet Take 1 tablet (0.5 mg total) by mouth 3 (three) times daily.  . clotrimazole-betamethasone (LOTRISONE) cream Apply externally BID for 2 wks  . estradiol (VIVELLE-DOT) 0.075 MG/24HR Place 1 patch onto the skin 2 (two) times a week.  . Eszopiclone 3 MG TABS TAKE 1 TAB AT BEDTIME AS NEEDED FOR SLEEP. MAY TRY 1/2 TAB 1ST TO SEE EFFECTIVE BEFORE FULL TABLET.  Marland Kitchen loratadine (CLARITIN) 10 MG tablet Take by mouth.  Marland Kitchen omeprazole (PRILOSEC) 40 MG capsule Take 40 mg by mouth 2 (two) times daily.  . SUMAtriptan (IMITREX) 100 MG tablet Take by mouth.  . [DISCONTINUED] methocarbamol (ROBAXIN) 500 MG tablet Take 1 tablet (500 mg total) by mouth 4 (four) times daily.   No facility-administered medications prior to visit.    Review of Systems  HENT: Positive for drooling.   Musculoskeletal: Positive for back pain, myalgias and neck pain.  Allergic/Immunologic: Positive for environmental allergies.  Psychiatric/Behavioral: Positive for sleep disturbance.  All other systems reviewed and are negative.     Objective    BP 93/62   Pulse 65   Temp 97.7 F (36.5 C) (Oral)   Resp 16   Ht 5\' 8"  (1.727 m)   Wt 146 lb (66.2 kg)   SpO2 100%   BMI 22.20 kg/m  BP Readings from Last 3 Encounters:  09/08/20 93/62  08/22/20 90/60  07/05/20 103/64   Wt Readings from Last 3 Encounters:  09/08/20 146 lb (66.2 kg)  08/22/20 150 lb (68 kg)  07/05/20 154 lb 6.4 oz (70 kg)      Physical Exam Vitals reviewed.  Constitutional:      General: She is not in acute distress.    Appearance: She is well-developed. She is not diaphoretic.     Interventions: She is not intubated.    Comments: Patient is alert and oriented and responsive to questions  Engages in eye contact with provider. Speaks in full sentences without any pauses without any shortness of breath or distress.    HENT:     Head: Normocephalic and atraumatic.     Right Ear: External ear normal.     Left Ear: External ear normal.     Nose: Nose normal.     Mouth/Throat:     Pharynx: No oropharyngeal exudate.  Eyes:     General: Lids are normal. No scleral  icterus.       Right eye: No discharge.        Left eye: No discharge.     Conjunctiva/sclera: Conjunctivae normal.     Right eye: Right conjunctiva is not injected. No exudate or hemorrhage.    Left eye: Left conjunctiva is not injected. No exudate or hemorrhage.    Pupils: Pupils are equal, round, and reactive to light.  Neck:     Thyroid: No thyroid mass or thyromegaly.     Vascular: Normal carotid pulses. No carotid bruit, hepatojugular reflux or JVD.     Trachea: Trachea and phonation normal. No tracheal tenderness or tracheal deviation.     Meningeal: Brudzinski's sign and Kernig's sign absent.  Cardiovascular:     Rate and Rhythm: Normal rate and regular rhythm.     Pulses: Normal pulses.          Radial pulses are 2+ on the right side and 2+ on the left side.       Dorsalis pedis pulses are 2+ on the right side and 2+ on the left side.       Posterior tibial pulses are 2+ on the right side and 2+ on the left side.     Heart sounds: Normal heart sounds, S1 normal and S2 normal. Heart sounds not distant. No murmur heard. No friction rub. No gallop.   Pulmonary:     Effort: Pulmonary effort is normal. No tachypnea, bradypnea, accessory muscle usage or respiratory distress. She is not intubated.     Breath sounds: Normal breath sounds. No stridor. No wheezing, rhonchi or rales.  Chest:     Chest wall: No tenderness.  Breasts:     Right: No supraclavicular adenopathy.     Left: No supraclavicular adenopathy.    Abdominal:     General: Bowel sounds are normal. There is no distension or abdominal bruit.      Palpations: Abdomen is soft. There is no shifting dullness, fluid wave, hepatomegaly, splenomegaly, mass or pulsatile mass.     Tenderness: There is no abdominal tenderness. There is no right CVA tenderness, left CVA tenderness, guarding or rebound.     Hernia: No hernia is present.  Musculoskeletal:        General: No tenderness or deformity. Normal range of motion.     Cervical back: Full passive range of motion without pain, normal range of motion and neck supple. No edema, erythema or rigidity. No spinous process tenderness or muscular tenderness. Normal range of motion.       Legs:  Lymphadenopathy:     Head:     Right side of head: No submental, submandibular, tonsillar, preauricular, posterior auricular or occipital adenopathy.     Left side of head: No submental, submandibular, tonsillar, preauricular, posterior auricular or occipital adenopathy.     Cervical: No cervical adenopathy.     Right cervical: No superficial, deep or posterior cervical adenopathy.    Left cervical: No superficial, deep or posterior cervical adenopathy.     Upper Body:     Right upper body: No supraclavicular or pectoral adenopathy.     Left upper body: No supraclavicular or pectoral adenopathy.  Skin:    General: Skin is warm and dry.     Coloration: Skin is not pale.     Findings: No abrasion, bruising, burn, ecchymosis, erythema, lesion, petechiae or rash.     Nails: There is no clubbing.  Neurological:     Mental Status: She is alert and  oriented to person, place, and time.     GCS: GCS eye subscore is 4. GCS verbal subscore is 5. GCS motor subscore is 6.     Cranial Nerves: No cranial nerve deficit.     Sensory: No sensory deficit.     Motor: No tremor, atrophy, abnormal muscle tone or seizure activity.     Coordination: Coordination normal.     Gait: Gait normal.     Deep Tendon Reflexes: Reflexes are normal and symmetric. Reflexes normal. Babinski sign absent on the right side. Babinski sign  absent on the left side.     Reflex Scores:      Tricep reflexes are 2+ on the right side and 2+ on the left side.      Bicep reflexes are 2+ on the right side and 2+ on the left side.      Brachioradialis reflexes are 2+ on the right side and 2+ on the left side.      Patellar reflexes are 2+ on the right side and 2+ on the left side.      Achilles reflexes are 2+ on the right side and 2+ on the left side. Psychiatric:        Speech: Speech normal.        Behavior: Behavior normal.        Thought Content: Thought content normal.        Judgment: Judgment normal.      Last depression screening scores PHQ 2/9 Scores 09/08/2020 08/12/2019  PHQ - 2 Score 2 0  PHQ- 9 Score 6 2   Last fall risk screening Fall Risk  09/08/2020  Falls in the past year? 0  Number falls in past yr: 0  Injury with Fall? 0   Last Audit-C alcohol use screening Alcohol Use Disorder Test (AUDIT) 09/08/2020  1. How often do you have a drink containing alcohol? 0  2. How many drinks containing alcohol do you have on a typical day when you are drinking? 0  3. How often do you have six or more drinks on one occasion? 0  AUDIT-C Score 0   A score of 3 or more in women, and 4 or more in men indicates increased risk for alcohol abuse, EXCEPT if all of the points are from question 1   Results for orders placed or performed in visit on 09/08/20  POCT urinalysis dipstick  Result Value Ref Range   Color, UA yellow    Clarity, UA clear    Glucose, UA Negative Negative   Bilirubin, UA negative    Ketones, UA negative    Spec Grav, UA <=1.005 (A) 1.010 - 1.025   Blood, UA negative    pH, UA 5.0 5.0 - 8.0   Protein, UA Negative Negative   Urobilinogen, UA 0.2 0.2 or 1.0 E.U./dL   Nitrite, UA negative    Leukocytes, UA Negative Negative   Appearance     Odor      Assessment & Plan    Routine Health Maintenance and Physical Exam  Exercise Activities and Dietary recommendations Goals   None      There is  no immunization history on file for this patient.  Health Maintenance  Topic Date Due  . Hepatitis C Screening  Never done  . TETANUS/TDAP  Never done  . COLONOSCOPY (Pts 45-6yrs Insurance coverage will need to be confirmed)  Never done  . INFLUENZA VACCINE  12/25/2020  . MAMMOGRAM  10/20/2021  . HIV Screening  Completed  . HPV VACCINES  Aged Out  . PAP SMEAR-Modifier  Discontinued    Discussed health benefits of physical activity, and encouraged her to engage in regular exercise appropriate for her age and condition.  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  - 2 weeks should hear from them.  Annual physical exam - Plan: POCT urinalysis dipstick  Encounter for hepatitis C screening test for low risk patient  Screening for HIV (human immunodeficiency virus)  Hot flashes - Plan: TSH, CBC with Differential/Platelet, Testosterone,Free and Total, FSH/LH, Progesterone, CANCELED: Estrogens, Total  Insomnia, unspecified type - Plan: Comprehensive metabolic panel, Lipid Panel With LDL/HDL Ratio, Estrogens, Total  Vitamin D deficiency - Plan: VITAMIN D 25 Hydroxy (Vit-D Deficiency, Fractures)  Muscle spasm - Plan: methocarbamol (ROBAXIN) 500 MG tablet, Ambulatory referral to Sports Medicine  Piriformis syndrome, unspecified laterality - Plan: Ambulatory referral to Sports Medicine  Chronic neck pain - Plan: Ambulatory referral to Sports Medicine  Trochanteric bursitis of both hips - Plan: Ambulatory referral to Sports Medicine  Orders Placed This Encounter  Procedures  . Comprehensive metabolic panel  . Lipid Panel With LDL/HDL Ratio  . TSH  . VITAMIN D 25 Hydroxy (Vit-D Deficiency, Fractures)  . CBC with Differential/Platelet  . Testosterone,Free and Total  . FSH/LH  . Progesterone  . Estrogens, Total  . Ambulatory referral to Sports Medicine  . POCT urinalysis dipstick   Refill given should hear from sports medicine within two weeks. She declines  Physical Therapy prefers sports medicine.  Keep follow up with neurosurgeon and orthopedics who are following neck and hip pain.  Meds ordered this encounter  Medications  . methocarbamol (ROBAXIN) 500 MG tablet    Sig: Take 1 tablet (500 mg total) by mouth 4 (four) times daily.    Dispense:  90 tablet    Refill:  3   Red Flags discussed. The patient was given clear instructions to go to ER or return to medical center if any red flags develop, symptoms do not improve, worsen or new problems develop. They verbalized understanding.   Return in about 6 months (around 03/10/2021), or if symptoms worsen or fail to improve, for at any time for any worsening symptoms, Go to Emergency room/ urgent care if worse.     The entirety of the information documented in the History of Present Illness, Review of Systems and Physical Exam were personally obtained by me. Portions of this information were initially documented by the CMA and reviewed by me for thoroughness and accuracy.    Marcille Buffy, [place] [phone] (phone) [phone] (fax)  [place]

## 2020-09-11 ENCOUNTER — Encounter: Payer: Self-pay | Admitting: Adult Health

## 2020-09-12 DIAGNOSIS — R232 Flushing: Secondary | ICD-10-CM | POA: Insufficient documentation

## 2020-09-25 NOTE — Progress Notes (Signed)
CBC, CMP , TSH for thyroid, Vitamin D, within normal limits.  Total cholesterol and LDL elevated.  Discuss lifestyle modification with patient e.g. increase exercise, fiber, fruits, vegetables, lean meat, and omega 3/fish intake and decrease saturated fat.  If patient following strict diet and exercise program already please schedule follow up appointment with primary care physician Testosterone same as last year less than three the free testosterone has not yet resulted. [place] is in post menopausal range. Progesterone ok. Estrogen still pending results.

## 2020-09-26 ENCOUNTER — Encounter: Payer: Self-pay | Admitting: Adult Health

## 2020-09-27 LAB — COMPREHENSIVE METABOLIC PANEL
ALT: 22 IU/L (ref 0–32)
AST: 19 IU/L (ref 0–40)
Albumin/Globulin Ratio: 1.7 (ref 1.2–2.2)
Albumin: 4.3 g/dL (ref 3.8–4.9)
Alkaline Phosphatase: 50 IU/L (ref 44–121)
BUN/Creatinine Ratio: 21 (ref 9–23)
BUN: 21 mg/dL (ref 6–24)
Bilirubin Total: 0.6 mg/dL (ref 0.0–1.2)
CO2: 27 mmol/L (ref 20–29)
Calcium: 9.4 mg/dL (ref 8.7–10.2)
Chloride: 101 mmol/L (ref 96–106)
Creatinine, Ser: 0.98 mg/dL (ref 0.57–1.00)
Globulin, Total: 2.5 g/dL (ref 1.5–4.5)
Glucose: 95 mg/dL (ref 65–99)
Potassium: 4.2 mmol/L (ref 3.5–5.2)
Sodium: 141 mmol/L (ref 134–144)
Total Protein: 6.8 g/dL (ref 6.0–8.5)
eGFR: 66 mL/min/{1.73_m2} (ref >59)

## 2020-09-27 LAB — TSH: TSH: 2.19 u[IU]/mL (ref 0.450–4.500)

## 2020-09-27 LAB — CBC WITH DIFFERENTIAL/PLATELET
Basophils Absolute: 0 10*3/uL (ref 0.0–0.2)
Basos: 1 %
EOS (ABSOLUTE): 0.1 10*3/uL (ref 0.0–0.4)
Eos: 2 %
Hematocrit: 40.8 % (ref 34.0–46.6)
Hemoglobin: 13.9 g/dL (ref 11.1–15.9)
Immature Grans (Abs): 0 10*3/uL (ref 0.0–0.1)
Immature Granulocytes: 0 %
Lymphocytes Absolute: 1.1 10*3/uL (ref 0.7–3.1)
Lymphs: 31 %
MCH: 31.9 pg (ref 26.6–33.0)
MCHC: 34.1 g/dL (ref 31.5–35.7)
MCV: 94 fL (ref 79–97)
Monocytes Absolute: 0.3 10*3/uL (ref 0.1–0.9)
Monocytes: 9 %
Neutrophils Absolute: 2.1 10*3/uL (ref 1.4–7.0)
Neutrophils: 57 %
Platelets: 252 10*3/uL (ref 150–450)
RBC: 4.36 x10E6/uL (ref 3.77–5.28)
RDW: 13 % (ref 11.7–15.4)
WBC: 3.6 10*3/uL (ref 3.4–10.8)

## 2020-09-27 LAB — LIPID PANEL WITH LDL/HDL RATIO
Cholesterol, Total: 238 mg/dL — ABNORMAL HIGH (ref 100–199)
HDL: 53 mg/dL (ref >39)
LDL Chol Calc (NIH): 170 mg/dL — ABNORMAL HIGH (ref 0–99)
LDL/HDL Ratio: 3.2 ratio (ref 0.0–3.2)
Triglycerides: 88 mg/dL (ref 0–149)
VLDL Cholesterol Cal: 15 mg/dL (ref 5–40)

## 2020-09-27 LAB — PROGESTERONE: Progesterone: 0.2 ng/mL

## 2020-09-27 LAB — FSH/LH
FSH: 74.4 m[IU]/mL
LH: 58.7 m[IU]/mL

## 2020-09-27 LAB — TESTOSTERONE,FREE AND TOTAL
Testosterone, Free: 0.5 pg/mL (ref 0.0–4.2)
Testosterone: <3 ng/dL — ABNORMAL LOW (ref 4–50)

## 2020-09-27 LAB — ESTROGENS, TOTAL: Estrogen: 72 pg/mL (ref 40–244)

## 2020-09-27 LAB — VITAMIN D 25 HYDROXY (VIT D DEFICIENCY, FRACTURES): Vit D, 25-Hydroxy: 84 ng/mL (ref 30.0–100.0)

## 2020-10-24 NOTE — Progress Notes (Signed)
[facility] [place] Phone: [phone] Subjective:   [name], am serving as a scribe for Dr. Hulan Saas. This visit occurred during the SARS-CoV-2 public health emergency.  Safety protocols were in place, including screening questions prior to the visit, additional usage of staff PPE, and extensive cleaning of exam room while observing appropriate contact time as indicated for disinfecting solutions.   I'm seeing this patient by the request  of:  Flinchum, Kelby Aline, FNP  CC: neck pain   [id number]  Lisa Gonzalez is a 60 y.o. female coming in with complaint of R hip pain for years since she had a child in 59. Patient states that she has pain that radiates down her R leg. Also feels "zapping" sensation into the foot which occurs with activity. Patient has had 2 cortisone injection in piriformis and in GT in both L and R side which did help the bursitis pain. Piriformis pain did not go away but patient had injection after having back surgery. Microdisectomy in 2014 and fusion on L4/L5 in 2018. Patient is very active and has been strengthening which helps her pain. Wears back brace as well. Uses muscle relaxers for pain.   Has had 2 epidural injections in neck that helped with her pain.    MRI of the cervical spine was independently visualized by me from February 2022.  Patient does have degenerative changes at multiple levels in the cervical spine.  Patient does have some neuroforaminal narrowing mostly at C4-5 and C5 6. Patient did undergo a transforaminal epidural at C5-C6 on the right side Sep 27, 2020  MRI of the lumbar spine was also done in April 2022.  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  Patient is a posterior lumbar interbody fusion at L4-L5 and some chronic degenerative disc disease at L5-S1 without nerve root impingement. Past Medical History:  Diagnosis  Date  . Allergy   . Anxiety   . Back pain   . Cyst of left breast   . GERD (gastroesophageal reflux disease)   . History of endometriosis   . History of ovarian cyst   . PTSD (post-traumatic stress disorder)    Past Surgical History:  Procedure Laterality Date  . ABDOMINAL HYSTERECTOMY  2019  . SPINAL FUSION  2018  . SPINE SURGERY     Social History   Socioeconomic History  . Marital status: Married    Spouse name: Not on file  . Number of children: Not on file  . Years of education: Not on file  . Highest education level: Not on file  Occupational History  . Not on file  Tobacco Use  . Smoking status: Never Smoker  . Smokeless tobacco: Never Used  Vaping Use  . Vaping Use: Never used  Substance and Sexual Activity  . Alcohol use: Yes    Alcohol/week: 1.0 standard drink    Types: 1 Glasses of wine per week  . Drug use: Never  . Sexual activity: Yes    Birth control/protection: None, Surgical    Comment: Hysterectomy  Other Topics Concern  . Not on file  Social History Narrative  . Not on file   Social Determinants of Health   Financial Resource Strain: Not on file  Food Insecurity: Not on file  Transportation Needs: Not on file  Physical Activity: Not on file  Stress: Not on file  Social Connections: Not on  file   Allergies  Allergen Reactions  . Meperidine Swelling and Nausea Only    Blood Pressure Dropped Low BP and pass out.   Marland Kitchen Hydrocodone Nausea And Vomiting, Swelling and Nausea Only    Hot and cold chills, vomiting, headache, and chest pain  Other reaction(s): Vomiting Hot and cold chills, headache, chest pain  . Hydromorphone Nausea And Vomiting and Nausea Only    Other reaction(s): Vomiting  . Opium Other (See Comments)    Hot and cold chills, vomiting, headache and chest pain  . Oxycodone Nausea And Vomiting and Nausea Only    Other reaction(s): Other (comments) Hot and cold chills, headache, vomiting, and chest pain.    Family History   Problem Relation Age of Onset  . Autoimmune disease Mother   . Heart attack Father 64  . Cancer - Colon Maternal Grandmother   . Alzheimer's disease Maternal Grandmother   . Heart attack Maternal Grandfather   . Bone cancer Paternal Grandmother     Current Outpatient Medications (Endocrine & Metabolic):  .  estradiol (VIVELLE-DOT) 0.075 MG/24HR, Place 1 patch onto the skin 2 (two) times a week.   Current Outpatient Medications (Respiratory):  .  loratadine (CLARITIN) 10 MG tablet, Take by mouth.  Current Outpatient Medications (Analgesics):  Marland Kitchen  SUMAtriptan (IMITREX) 100 MG tablet, Take by mouth.   Current Outpatient Medications (Other):  Marland Kitchen  ALPRAZolam (XANAX) 0.5 MG tablet, Take 1 tablet (0.5 mg total) by mouth 3 (three) times daily. .  clotrimazole-betamethasone (LOTRISONE) cream, Apply externally BID for 2 wks .  Eszopiclone 3 MG TABS, TAKE 1 TAB AT BEDTIME AS NEEDED FOR SLEEP. MAY TRY 1/2 TAB 1ST TO SEE EFFECTIVE BEFORE FULL TABLET. .  methocarbamol (ROBAXIN) 500 MG tablet, Take 1 tablet (500 mg total) by mouth 4 (four) times daily. Marland Kitchen  omeprazole (PRILOSEC) 40 MG capsule, Take 40 mg by mouth 2 (two) times daily.   Reviewed prior external information including notes and imaging from  primary care provider As well as notes that were available from care everywhere and other healthcare systems.  Past medical history, social, surgical and family history all reviewed in electronic medical record.  No pertanent information unless stated regarding to the chief complaint.   Review of Systems:  No headache, visual changes, nausea, vomiting, diarrhea, constipation, dizziness, abdominal pain, skin rash, fevers, chills, night sweats, weight loss, swollen lymph nodes, body aches, joint swelling, chest pain, shortness of breath, mood changes. POSITIVE muscle aches  Objective  Blood pressure 102/82, pulse 92, height 5\' 8"  (1.727 m), weight 151 lb (68.5 kg), SpO2 95 %.   General: No  apparent distress alert and oriented x3 mood and affect normal, dressed appropriately.  HEENT: Pupils equal, extraocular movements intact  Respiratory: Patient's speak in full sentences and does not appear short of breath  Cardiovascular: No lower extremity edema, non tender, no erythema  Gait normal with good balance and coordination.  MSK: Neck exam does have some mild loss of lordosis.  Good range of motion.  Patient does have some very mild weakness in the parascapular region with mild scapular dyskinesis noted. Patient's low back exam does have significant loss of lordosis.  Patient has worsening pain with flexion and extension.  No true radicular symptoms.  Negative straight leg test.  Feels like she gets some relief with FABER test right side.  Patient is tender to palpation a little bit over the piriformis muscle itself.    [id number]; 15 additional minutes spent for Therapeutic  exercises as stated in above notes.  This included exercises focusing on stretching, strengthening, with significant focus on eccentric aspects.   Long term goals include an improvement in range of motion, strength, endurance as well as avoiding reinjury. Patient's frequency would include in 1-2 times a day, 3-5 times a week for a duration of 6-12 weeks. Using Netter's Orthopaedic Anatomy, reviewed with the patient the structures involved and how they related to diagnosis. The patient indicated understanding.   The patient was given a handout about classic piriformis stretching including [employer], Modified [employer], my self-described "Sink Stretch," and other piriformis rehab.  We also reviewed hip flexor and abductor strengthening, ham stretching  Rec deep massage, explained self-massage with ball  Proper technique shown and discussed handout in great detail with ATC.  All questions were discussed and answered.   Impression and Recommendations:     The above documentation has been reviewed and is accurate and  complete Lyndal Pulley, DO

## 2020-10-25 ENCOUNTER — Ambulatory Visit (INDEPENDENT_AMBULATORY_CARE_PROVIDER_SITE_OTHER): Payer: 59 | Admitting: Family Medicine

## 2020-10-25 ENCOUNTER — Other Ambulatory Visit: Payer: Self-pay

## 2020-10-25 ENCOUNTER — Encounter: Payer: Self-pay | Admitting: Family Medicine

## 2020-10-25 DIAGNOSIS — G5701 Lesion of sciatic nerve, right lower limb: Secondary | ICD-10-CM

## 2020-10-25 DIAGNOSIS — M5412 Radiculopathy, cervical region: Secondary | ICD-10-CM | POA: Diagnosis not present

## 2020-10-25 NOTE — Assessment & Plan Note (Signed)
Patient does have some signs and symptoms that is consistent with more of a piriformis.  Differential does include a lumbar radiculopathy.  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  We discussed the posture and ergonomics, we discussed the hip abductor strengthening, we discussed manual massage in this area.  Patient has had an injection she states and we may consider that again if needed at follow-up in 6 weeks.

## 2020-10-25 NOTE — Patient Instructions (Signed)
Good to see you Tennis ball in back right pocket with sitting Try to stand with virtual appointments [employer] "total support" with training Hands within peripheral vision with lifting Vitamin D 2000 Tart charry 1200 mg at night See me again in 6 weeks

## 2020-10-25 NOTE — Assessment & Plan Note (Signed)
Discussed keeping hands within peripheral vision Has muscle relaxer, will work with trainer. RTC in 6 weeks, patient has done physical therapy in the past and would like to avoid doing it again.  Patient's MRI does show that patient does have some nerve impingement but I am hoping the patient does do relatively well with the injection she has had fairly recently.  Patient is also working with a massage therapist and a Restaurant manager, fast food.

## 2020-12-06 ENCOUNTER — Other Ambulatory Visit (HOSPITAL_COMMUNITY): Payer: Self-pay | Admitting: Orthopedic Surgery

## 2020-12-06 ENCOUNTER — Other Ambulatory Visit: Payer: Self-pay | Admitting: Orthopedic Surgery

## 2020-12-06 DIAGNOSIS — M25551 Pain in right hip: Secondary | ICD-10-CM

## 2020-12-18 ENCOUNTER — Ambulatory Visit: Admission: RE | Admit: 2020-12-18 | Payer: 59 | Source: Ambulatory Visit

## 2020-12-18 ENCOUNTER — Ambulatory Visit: Payer: 59

## 2021-01-18 ENCOUNTER — Ambulatory Visit
Admission: RE | Admit: 2021-01-18 | Discharge: 2021-01-18 | Disposition: A | Discharge status: Home / Self Care | Payer: 59 | Source: Ambulatory Visit | Attending: Orthopedic Surgery | Admitting: Orthopedic Surgery

## 2021-01-18 ENCOUNTER — Other Ambulatory Visit: Payer: Self-pay | Admitting: Adult Health

## 2021-01-18 ENCOUNTER — Ambulatory Visit: Payer: 59

## 2021-01-18 ENCOUNTER — Other Ambulatory Visit: Payer: Self-pay

## 2021-01-18 DIAGNOSIS — M25551 Pain in right hip: Secondary | ICD-10-CM

## 2021-01-18 IMAGING — RF DG FLUORO GUIDE NDL PLC/BX
1 series · 1 of 1 positions shown · non-contrast
Comparison: Right hip MRI

CLINICAL DATA: Right hip pain

EXAM:
Right HIP INJECTION UNDER FLUOROSCOPY

[Series 1: cp_standard · 0.19mm/px · 1 of 1 slices shown]
[im 1/1]
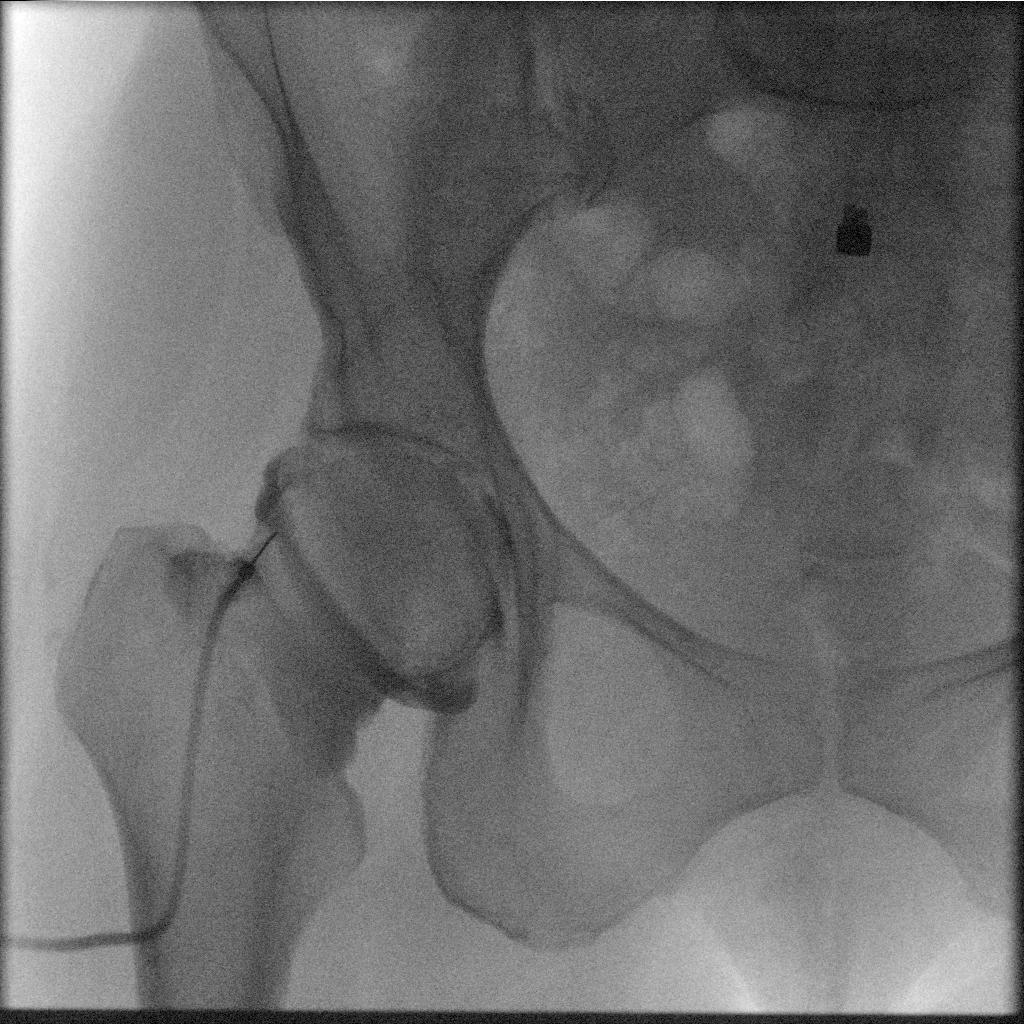

[1 of 1 positions shown; findings below may reference images not displayed]

FLUOROSCOPY TIME:  Fluoroscopy Time:  6 seconds

Radiation Exposure Index (if provided by the fluoroscopic device):
Not provided

Number of Acquired Spot Images: 0

PROCEDURE:
Overlying skin prepped with ChloraPrep, draped in the usual sterile
fashion, and infiltrated locally with buffered Lidocaine for local
anesthesia. A 22 gauge spinal needle advanced to the superolateral
margin of the right femoral neck. Diagnostic injection of iodinated
contrast demonstrates intra-articular spread without intravascular
component.

A mixture of 10 mL Omnipaque 180, 10 mL of normal saline, and
mL of Gadavist was injected into the hip joint. A total of 12 mL was
injected. No immediate complication.
IMPRESSION: Technically successful right hip injection under fluoroscopy.

## 2021-01-18 IMAGING — MR MR HIP*R* W/CM
4 of 6 series · 20 of 40 positions shown · IV contrast (agent unspecified)
Comparison: X-ray [DATE]

CLINICAL DATA: Severe right hip pain for 3 months. No known injury.

EXAM:
MRI OF THE RIGHT HIP WITH CONTRAST (MR Arthrogram)
TECHNIQUE: Multiplanar, multisequence MR imaging of the hip was performed
immediately following contrast injection into the hip joint under
fluoroscopic guidance. No intravenous contrast was administered.

[Series 8: T1 · coronal · right · 4.0mm · 0.59mm/px · 7 of 38 slices shown]
[im 1/38]
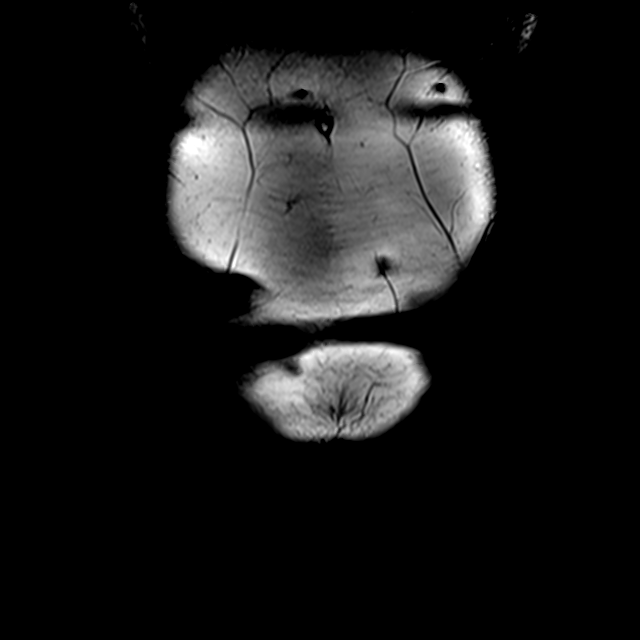
[im 6/38]
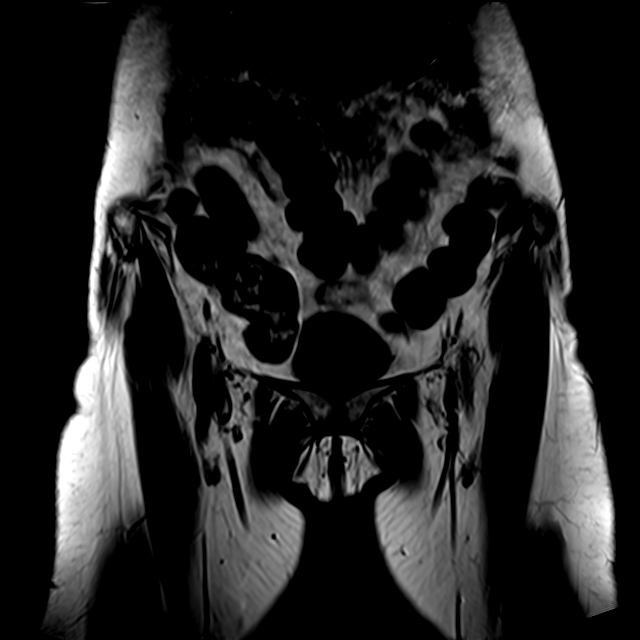
[im 11/38]
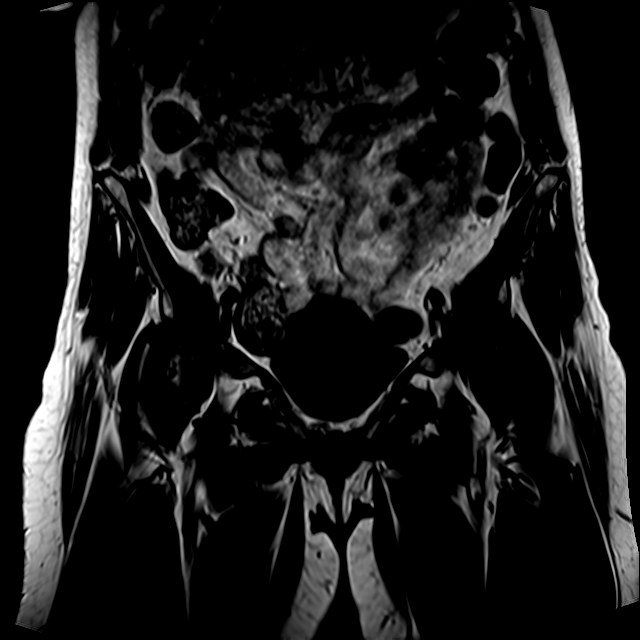
[im 16/38]
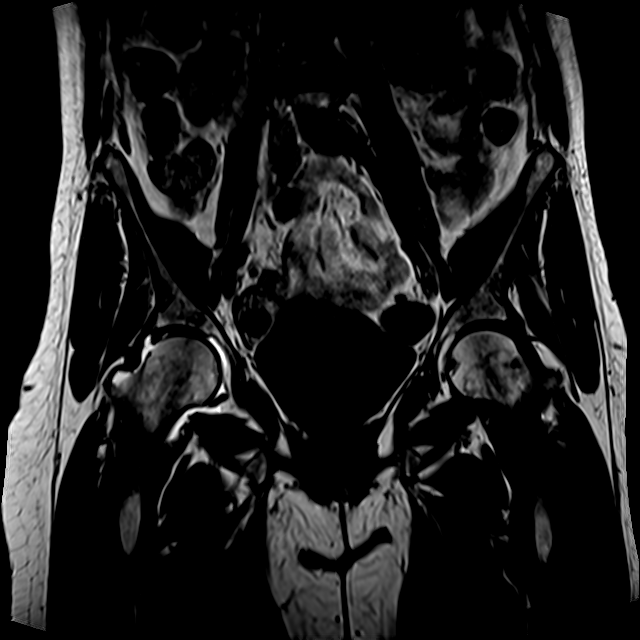
[im 22/38]
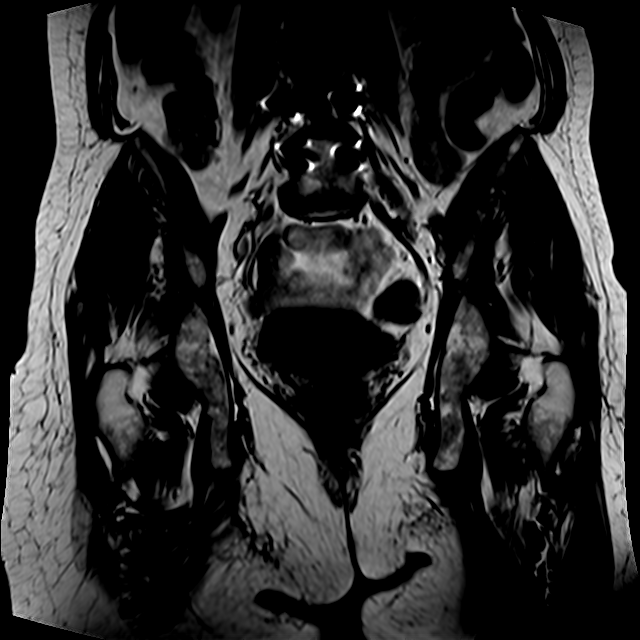
[im 27/38]
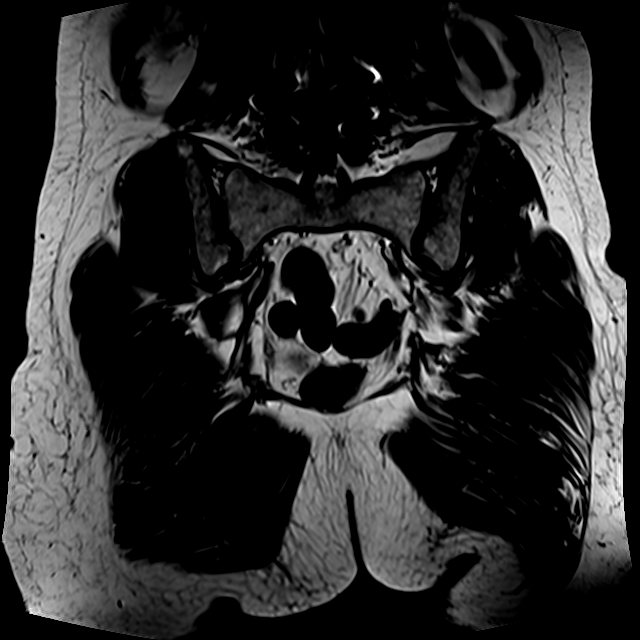
[im 32/38]
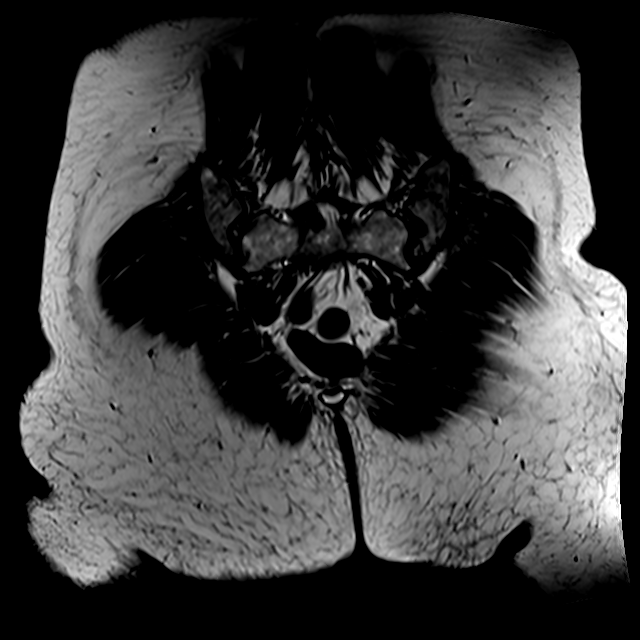

[Series 10: T2 fat-sat · axial · right · 4.0mm · 0.70mm/px · z∈[-37,+133]mm · 7 of 35 slices shown]
[im 1/35]
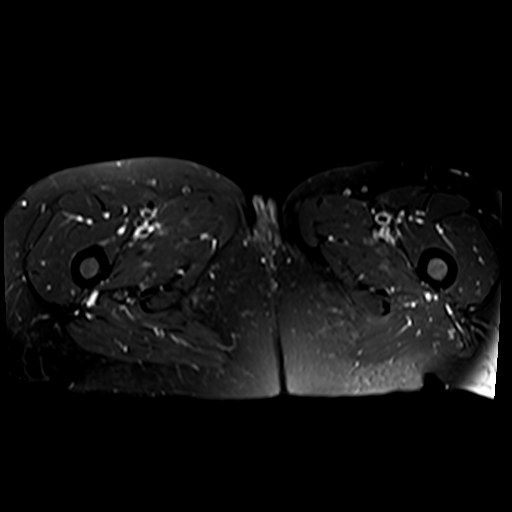
[im 6/35]
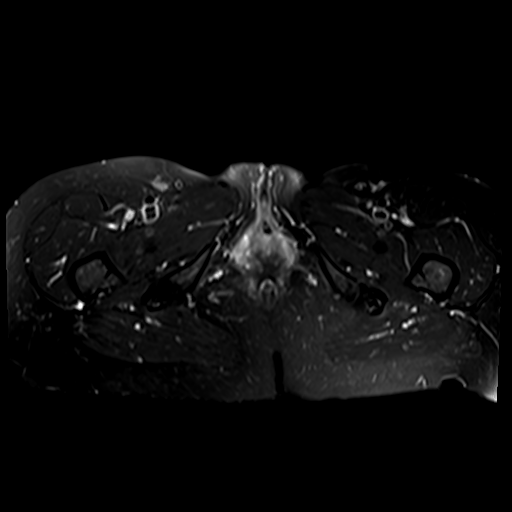
[im 12/35]
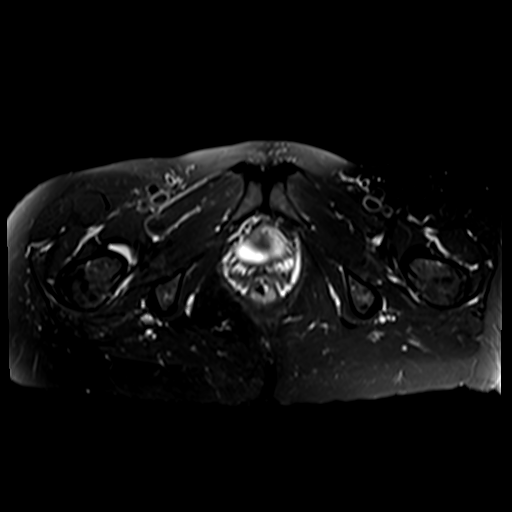
[im 18/35]
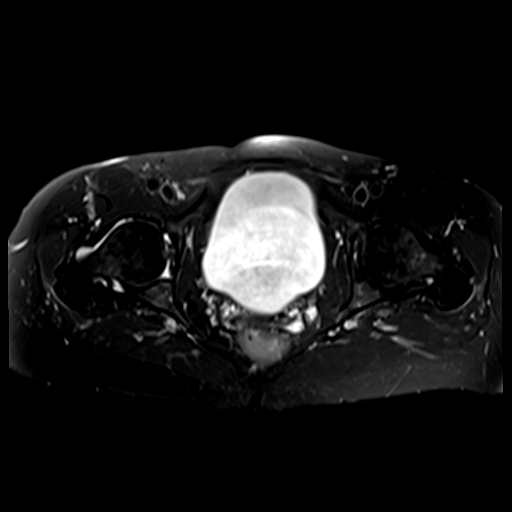
[im 23/35]
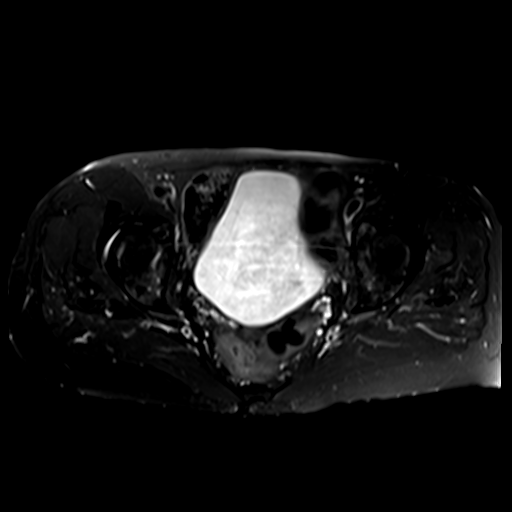
[im 29/35]
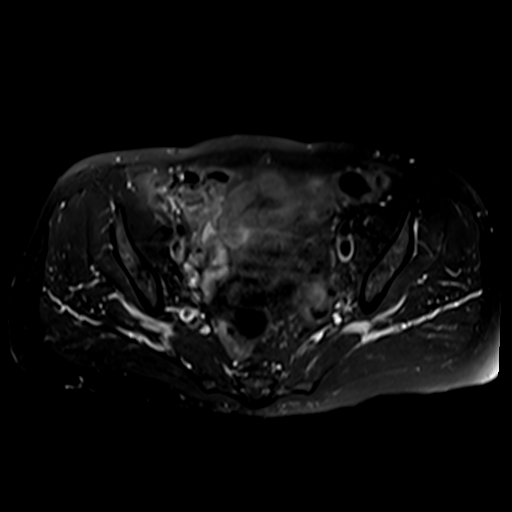
[im 35/35]
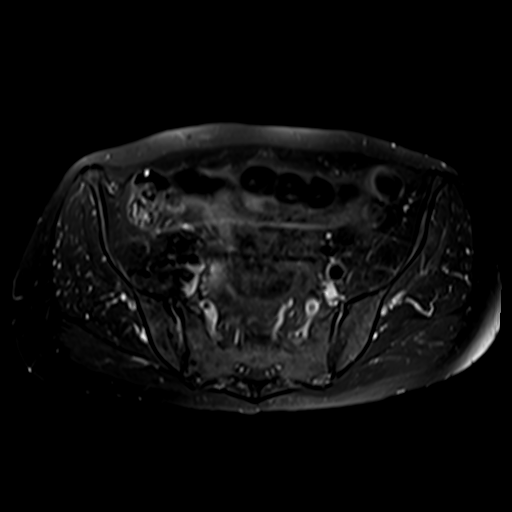

[Series 12: T1 fat-sat · sagittal · right · 4.0mm · 0.62mm/px · 3 of 25 slices shown (1 of 2)]
[im 1/25]
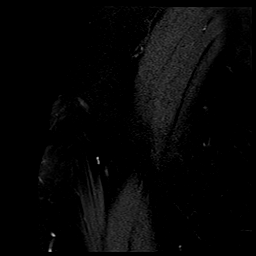
[im 13/25]
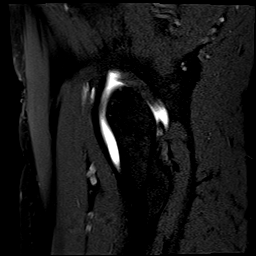
[im 25/25]
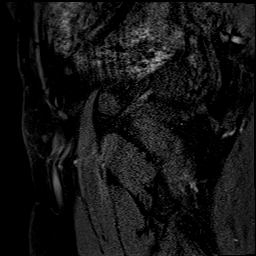

[Series 13: T1 fat-sat · oblique · right · 4.0mm · 0.70mm/px · 3 of 30 slices shown (2 of 2)]
[im 6/30]
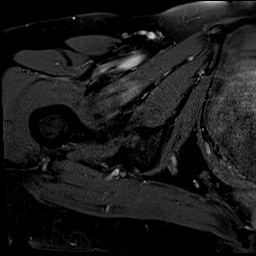
[im 18/30]
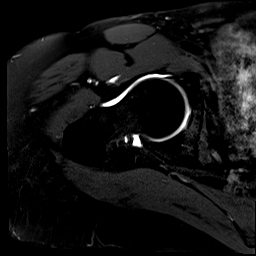
[im 30/30]
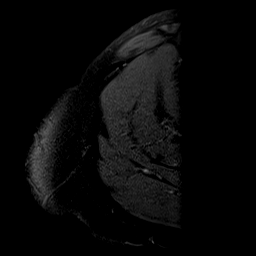

[20 of 40 positions shown; findings below may reference images not displayed]

FINDINGS: Bones

No acute fracture. No dislocation. No femoral head avascular
necrosis. Bony pelvis intact. SI joints and pubic symphysis within
normal limits. Prior fusion of L4-5 with associated susceptibility
artifact. No bone marrow edema. No marrow replacing bone lesion.

Articular cartilage and labrum

Articular cartilage: No focal chondral defect or subchondral signal
abnormality identified.

Labrum: Labrum intact without tear.  No paralabral cyst.

Joint or bursal effusion

Joint effusion: Well distended with injected contrast. No evidence
of synovitis.

Bursae: No abnormal bursal fluid collection.

Muscles and tendons

Muscles and tendons: The gluteal, hamstring, iliopsoas, rectus
femoris, and adductor tendons appear intact without tear or
significant tendinosis. Normal muscle bulk and signal intensity
without edema, atrophy, or fatty infiltration.

Other findings

Miscellaneous: No fluid collections. No inguinal lymphadenopathy. No
acute findings are seen within the pelvis. Minimal scattered sigmoid
diverticulosis.
IMPRESSION: Unremarkable MR arthrogram of the right hip. No findings to explain
the patient's right hip pain.

## 2021-01-18 MED ORDER — SODIUM CHLORIDE (PF) 0.9 % IJ SOLN
20.0000 mL | Freq: Once | INTRAMUSCULAR | Status: AC
  Administered 2021-01-18: 10 mL via INTRAVENOUS

## 2021-01-18 MED ORDER — IOHEXOL 180 MG/ML  SOLN
10.0000 mL | Freq: Once | INTRAMUSCULAR | Status: AC | PRN
  Administered 2021-01-18: 10 mL

## 2021-01-18 MED ORDER — LIDOCAINE HCL (PF) 1 % IJ SOLN
5.0000 mL | Freq: Once | INTRAMUSCULAR | Status: AC
  Administered 2021-01-18: 3 mL
  Filled 2021-01-18: qty 5

## 2021-01-18 MED ORDER — GADOBUTROL 1 MMOL/ML IV SOLN
0.0500 mL | Freq: Once | INTRAVENOUS | Status: AC | PRN
  Administered 2021-01-18: 0.05 mL

## 2021-01-21 ENCOUNTER — Ambulatory Visit: Payer: 59

## 2021-01-25 ENCOUNTER — Other Ambulatory Visit: Payer: Self-pay | Admitting: Adult Health

## 2021-02-07 ENCOUNTER — Telehealth: Payer: Self-pay | Admitting: Adult Health

## 2021-02-07 ENCOUNTER — Other Ambulatory Visit: Payer: Self-pay | Admitting: Family Medicine

## 2021-02-07 ENCOUNTER — Other Ambulatory Visit: Payer: Self-pay | Admitting: Family

## 2021-02-07 ENCOUNTER — Telehealth: Payer: Self-pay | Admitting: Family Medicine

## 2021-02-07 DIAGNOSIS — D126 Benign neoplasm of colon, unspecified: Secondary | ICD-10-CM

## 2021-02-07 DIAGNOSIS — Z1283 Encounter for screening for malignant neoplasm of skin: Secondary | ICD-10-CM

## 2021-02-07 MED ORDER — ALPRAZOLAM 0.5 MG PO TABS: 0.5000 mg | ORAL_TABLET | Freq: Three times a day (TID) | ORAL | 0 refills | Status: DC

## 2021-02-07 NOTE — Telephone Encounter (Signed)
Patient states she was supposed to be transferring from [facility] with michelle. Would like to continue with her as PCP here.   Please advise

## 2021-02-07 NOTE — Telephone Encounter (Signed)
Medication Refill - Medication: Eszopiclone 3 MG TABS ALPRAZolam  and (XANAX) 0.5 MG tablet  Has the patient contacted their pharmacy? yes (Agent: If no, request that the patient contact the pharmacy for the refill.) (Agent: If yes, when and what did the pharmacy advise?)contact pcp  Preferred Pharmacy (with phone number or street name):  [id number] [place] Phone:  [phone] Fax:  [phone]     Agent: Please be advised that RX refills may take up to 3 business days. We ask that you follow-up with your pharmacy.

## 2021-02-07 NOTE — Telephone Encounter (Signed)
Pt transfer of care appt scheduled

## 2021-02-07 NOTE — Telephone Encounter (Signed)
Referral Request - Has patient seen PCP for this complaint? yes *If NO, is insurance requiring patient see PCP for this issue before PCP can refer them? Referral for which specialty: colonoscopy and dermatologist Preferred provider/office: N/A Reason for referral: just annual

## 2021-02-12 NOTE — Progress Notes (Signed)
Unable to contact patient. Letter will be sent out.

## 2021-02-14 ENCOUNTER — Telehealth: Payer: Self-pay

## 2021-02-14 ENCOUNTER — Ambulatory Visit
Admission: RE | Admit: 2021-02-14 | Discharge: 2021-02-14 | Disposition: A | Discharge status: Home / Self Care | Payer: 59 | Source: Ambulatory Visit | Attending: Obstetrics and Gynecology | Admitting: Obstetrics and Gynecology

## 2021-02-14 ENCOUNTER — Other Ambulatory Visit: Payer: Self-pay

## 2021-02-14 ENCOUNTER — Other Ambulatory Visit: Payer: Self-pay | Admitting: Obstetrics and Gynecology

## 2021-02-14 DIAGNOSIS — Z1231 Encounter for screening mammogram for malignant neoplasm of breast: Secondary | ICD-10-CM | POA: Insufficient documentation

## 2021-02-14 IMAGING — MG MM DIGITAL SCREENING BILAT W/ TOMO AND CAD
8 series · 8 of 24 positions shown · non-contrast
Comparison: Previous exam(s).

CLINICAL DATA: Screening.

EXAM:
DIGITAL SCREENING BILATERAL MAMMOGRAM WITH TOMOSYNTHESIS AND CAD
TECHNIQUE: Bilateral screening digital craniocaudal and mediolateral oblique
mammograms were obtained. Bilateral screening digital breast
tomosynthesis was performed. The images were evaluated with
computer-aided detection.

[L MLO synth-2D]
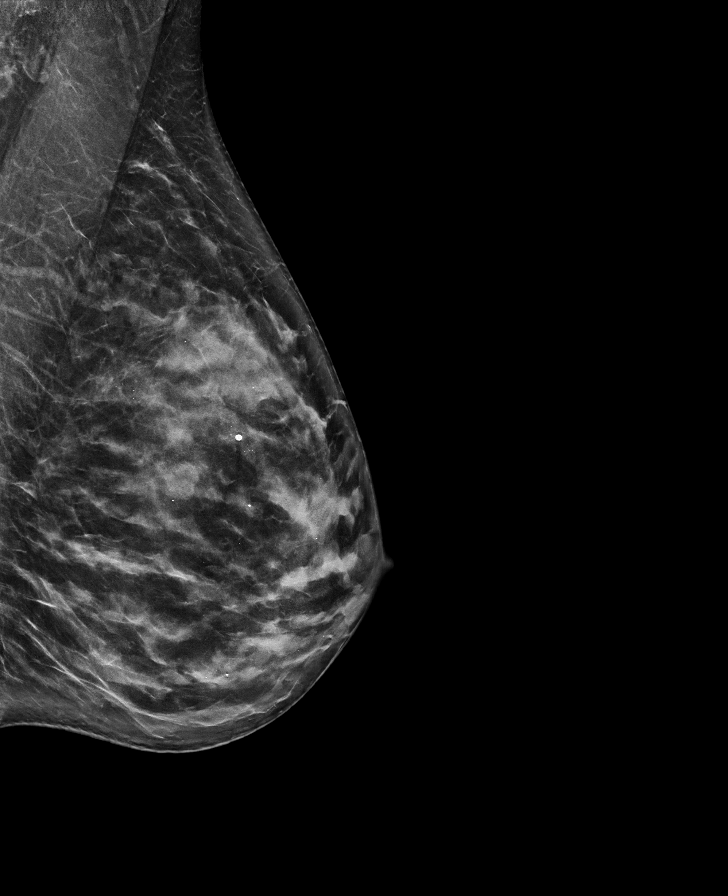

[R MLO synth-2D]
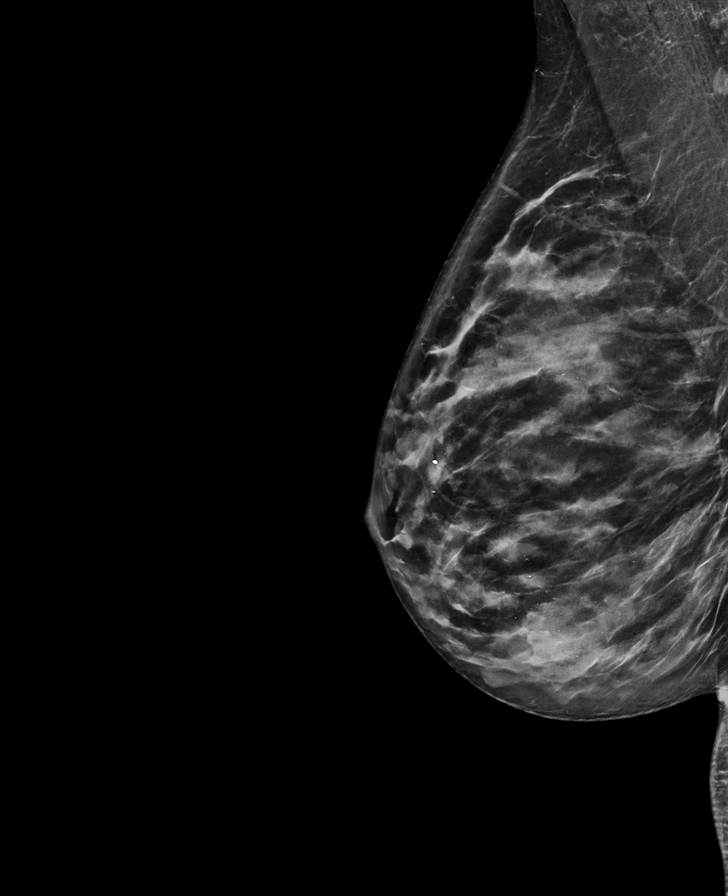

[L CC synth-2D]
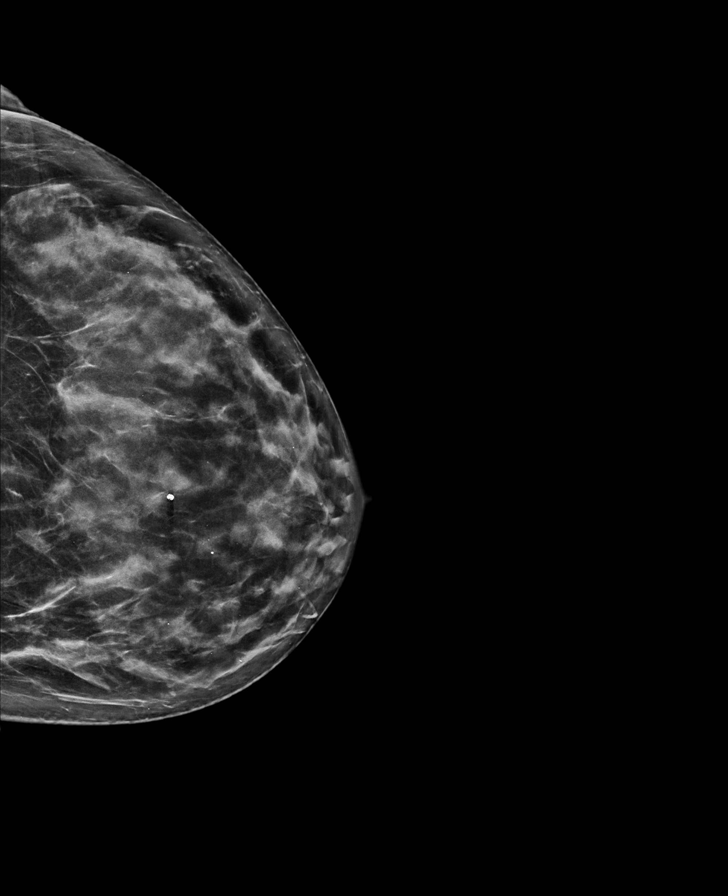

[R CC synth-2D]
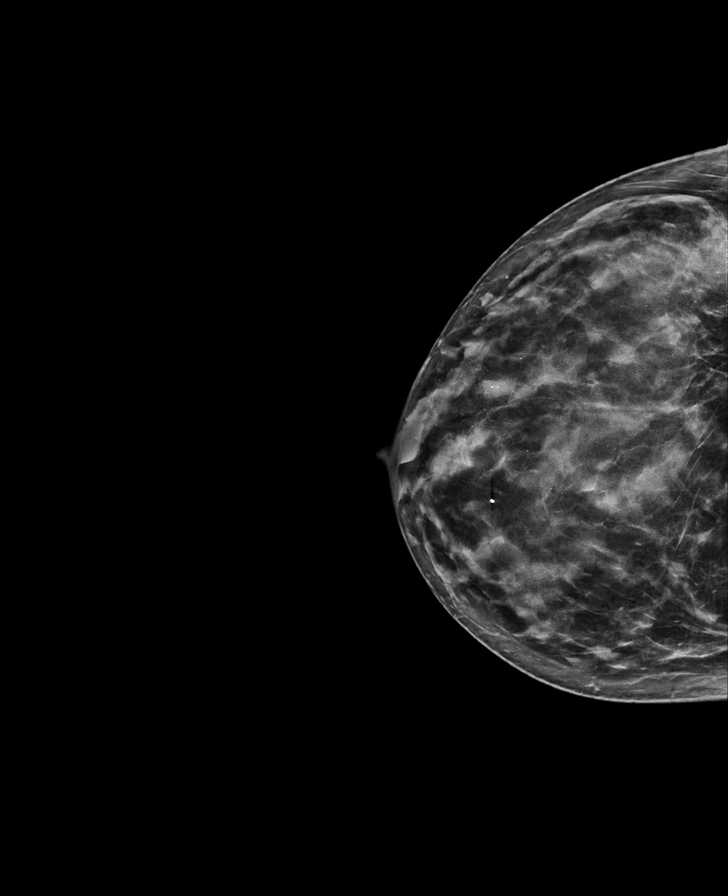

[R CC tomo · tomo slice 33/65.0]
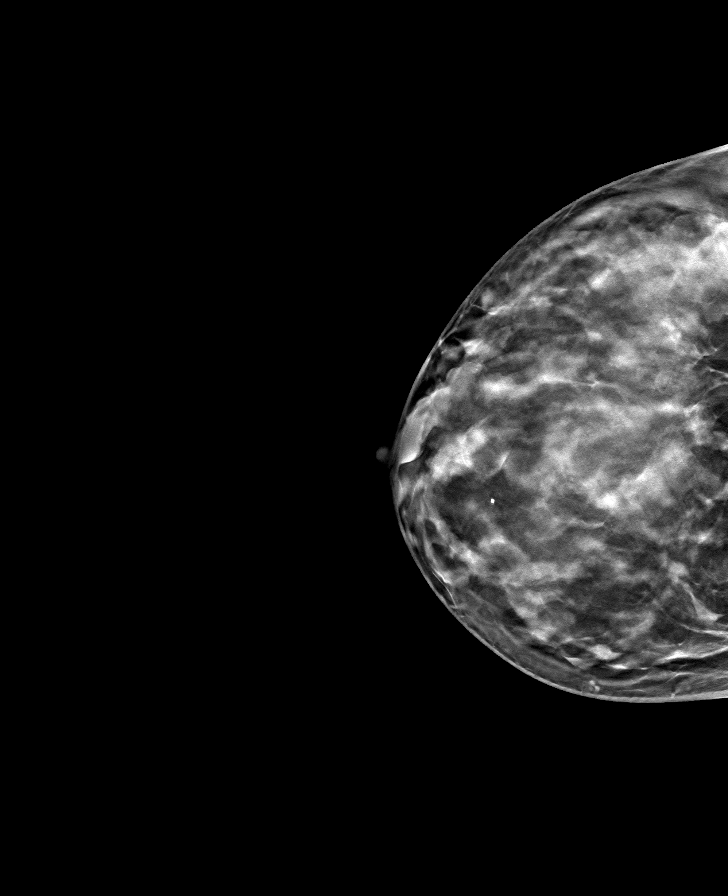

[L CC tomo · tomo slice 36/71.0]
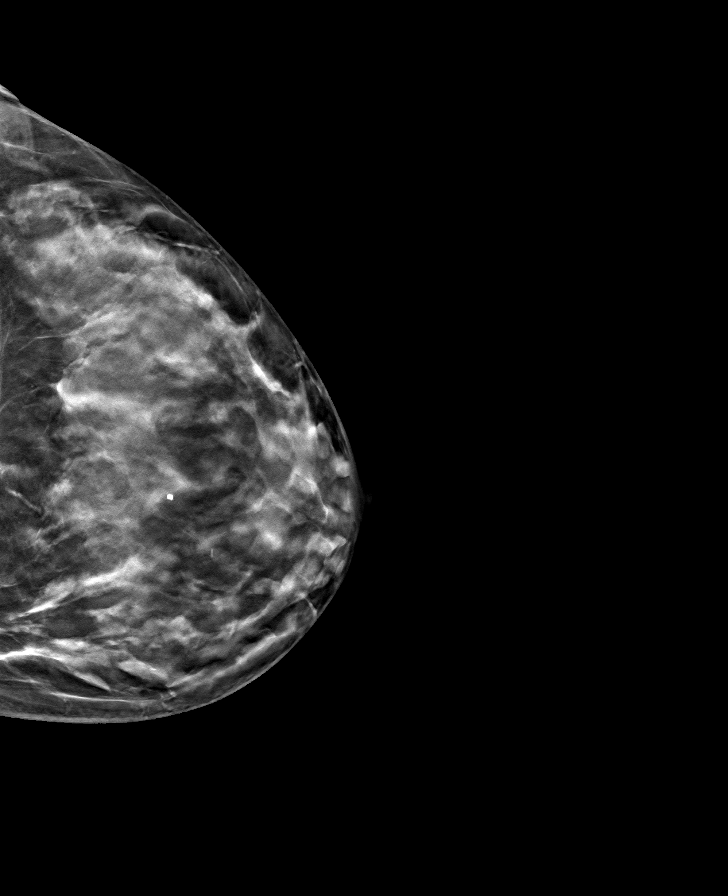

[L MLO tomo · tomo slice 38/75.0]
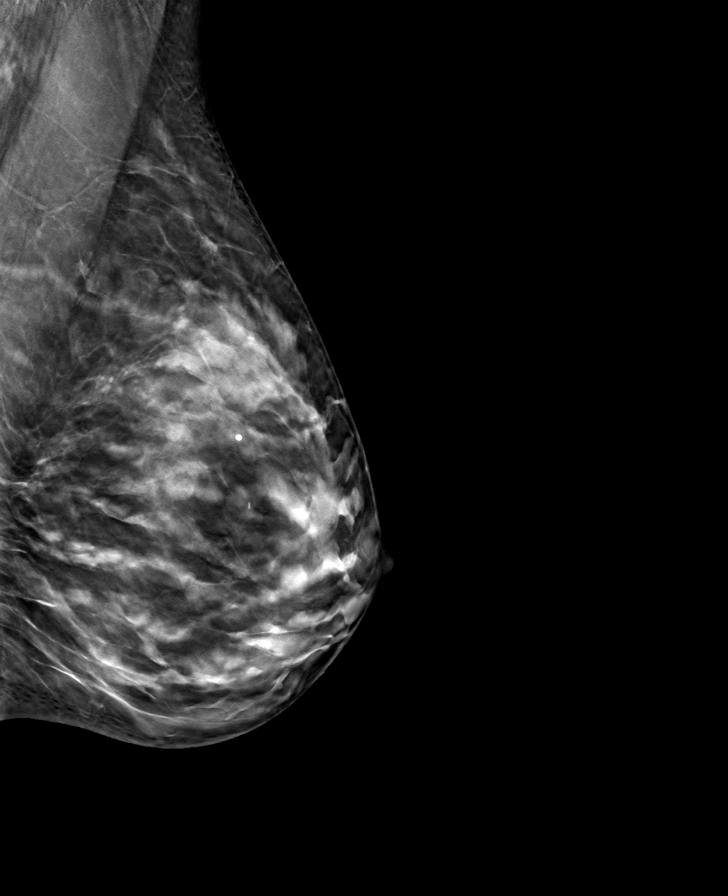

[R MLO tomo · tomo slice 37/72.0]
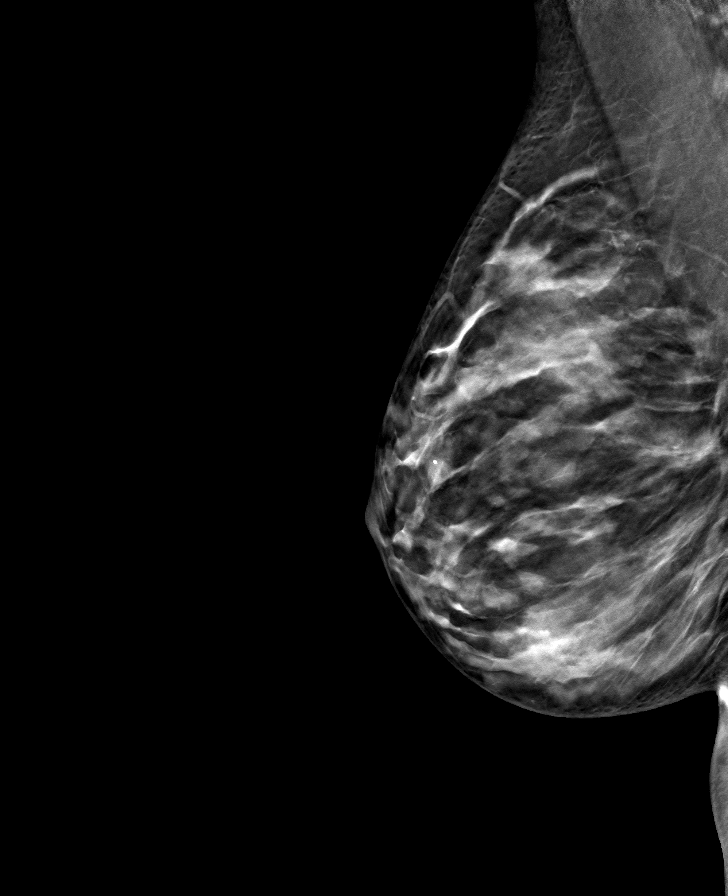

[8 of 24 positions shown; findings below may reference images not displayed]

ACR Breast Density Category c: The breast tissue is heterogeneously
dense, which may obscure small masses.
FINDINGS: There are no findings suspicious for malignancy.
IMPRESSION: No mammographic evidence of malignancy. A result letter of this
screening mammogram will be mailed directly to the patient.

RECOMMENDATION:
Screening mammogram in one year. (Code:[V2])

BI-RADS CATEGORY  1: Negative.

## 2021-02-14 MED ORDER — FLUCONAZOLE 150 MG PO TABS: 150.0000 mg | ORAL_TABLET | Freq: Once | ORAL | 0 refills | Status: AC

## 2021-02-14 NOTE — Telephone Encounter (Signed)
Rx diflucan eRxd. F/u if sx persist

## 2021-02-14 NOTE — Progress Notes (Signed)
Rx diflucan for yeast vag sx.  

## 2021-02-14 NOTE — Telephone Encounter (Signed)
Pt. Calling to schedule colonoscopy 

## 2021-02-14 NOTE — Telephone Encounter (Signed)
Called pt, no answer, left detailed msg Rx sent.

## 2021-02-14 NOTE — Telephone Encounter (Signed)
Pt calling; currently has a yeast inf; has had several in her lifetime; this is the first under ABC's care; has tried monistat; made it worse instead of better. Would like rx of pill x2; it usually does the trick; d/t work she doesn't have time to come in.  [phone]

## 2021-02-15 ENCOUNTER — Other Ambulatory Visit: Payer: Self-pay

## 2021-02-15 DIAGNOSIS — Z86010 Personal history of colonic polyps: Secondary | ICD-10-CM

## 2021-02-15 MED ORDER — NA SULFATE-K SULFATE-MG SULF 17.5-3.13-1.6 GM/177ML PO SOLN: 1.0000 | Freq: Once | ORAL | 0 refills | Status: AC

## 2021-02-15 NOTE — Telephone Encounter (Signed)
Procedure has been scheduled for 03/21/21.

## 2021-02-15 NOTE — Progress Notes (Signed)
Gastroenterology Pre-Procedure Review  Request Date: 03/21/21 Requesting Physician: Dr. Bonna Gains  PATIENT REVIEW QUESTIONS: The patient responded to the following health history questions as indicated:    1. Are you having any GI issues? no 2. Do you have a personal history of Polyps? yes (2016) 3. Do you have a family history of Colon Cancer or Polyps? no 4. Diabetes Mellitus? no 5. Joint replacements in the past 12 months?no 6. Major health problems in the past 3 months?no 7. Any artificial heart valves, MVP, or defibrillator?no    MEDICATIONS & ALLERGIES:    Patient reports the following regarding taking any anticoagulation/antiplatelet therapy:   Plavix, Coumadin, Eliquis, Xarelto, Lovenox, Pradaxa, Brilinta, or Effient? no Aspirin? no  Patient confirms/reports the following medications:  Current Outpatient Medications  Medication Sig Dispense Refill   ALPRAZolam (XANAX) 0.5 MG tablet Take 1 tablet (0.5 mg total) by mouth 3 (three) times daily. 90 tablet 0   clotrimazole-betamethasone (LOTRISONE) cream Apply externally BID for 2 wks 15 g 0   estradiol (VIVELLE-DOT) 0.075 MG/24HR Place 1 patch onto the skin 2 (two) times a week. 24 patch 3   Eszopiclone 3 MG TABS TAKE 1 TAB AT BEDTIME AS NEEDED FOR SLEEP. MAY TRY 1/2 TAB 1ST TO SEE EFFECTIVE BEFORE FULL TABLET. 30 tablet 0   loratadine (CLARITIN) 10 MG tablet Take by mouth.     methocarbamol (ROBAXIN) 500 MG tablet Take 1 tablet (500 mg total) by mouth 4 (four) times daily. 90 tablet 3   omeprazole (PRILOSEC) 40 MG capsule Take 40 mg by mouth 2 (two) times daily.     SUMAtriptan (IMITREX) 100 MG tablet Take by mouth.     No current facility-administered medications for this visit.    Patient confirms/reports the following allergies:  Allergies  Allergen Reactions   Meperidine Swelling and Nausea Only    Blood Pressure Dropped Low BP and pass out.    Hydrocodone Nausea And Vomiting, Swelling and Nausea Only    Hot and  cold chills, vomiting, headache, and chest pain  Other reaction(s): Vomiting Hot and cold chills, headache, chest pain   Hydromorphone Nausea And Vomiting and Nausea Only    Other reaction(s): Vomiting   Opium Other (See Comments)    Hot and cold chills, vomiting, headache and chest pain   Oxycodone Nausea And Vomiting and Nausea Only    Other reaction(s): Other (comments) Hot and cold chills, headache, vomiting, and chest pain.     No orders of the defined types were placed in this encounter.   AUTHORIZATION INFORMATION Primary Insurance: 1D#: Group #:  Secondary Insurance: 1D#: Group #:  SCHEDULE INFORMATION: Date: 03/21/21 Time: Location: [place]

## 2021-03-09 ENCOUNTER — Encounter: Payer: Self-pay | Admitting: Physical Therapy

## 2021-03-09 ENCOUNTER — Ambulatory Visit: Payer: 59 | Attending: Orthopedic Surgery | Admitting: Physical Therapy

## 2021-03-09 ENCOUNTER — Other Ambulatory Visit: Payer: Self-pay

## 2021-03-09 DIAGNOSIS — R2689 Other abnormalities of gait and mobility: Secondary | ICD-10-CM | POA: Insufficient documentation

## 2021-03-09 DIAGNOSIS — M6208 Separation of muscle (nontraumatic), other site: Secondary | ICD-10-CM | POA: Diagnosis present

## 2021-03-09 DIAGNOSIS — R102 Pelvic and perineal pain: Secondary | ICD-10-CM | POA: Insufficient documentation

## 2021-03-09 DIAGNOSIS — M79672 Pain in left foot: Secondary | ICD-10-CM | POA: Insufficient documentation

## 2021-03-09 DIAGNOSIS — M25551 Pain in right hip: Secondary | ICD-10-CM | POA: Diagnosis present
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
## 2021-03-09 NOTE — Patient Instructions (Signed)
  Avoid straining pelvic floor, abdominal muscles , spine  Use log rolling technique instead of getting out of bed with your neck or the sit-up     Log rolling into and out of bed   Log rolling into and out of bed If getting out of bed on R side, Bent knees, scoot hips/ shoulder to L  Raise R arm completely overhead, rolling onto armpit  Then lower bent knees to bed to get into complete side lying position  Then drop legs off bed, and push up onto R elbow/forearm, and use L hand to push onto the bed  __   Proper body mechanics with getting out of a chair to decrease strain  on back &pelvic floor   Avoid holding your breath when Getting out of the chair:  Scoot to front part of chair chair Heels behind knees, feet are hip width apart, nose over toes  Inhale like you are smelling roses Exhale to stand   __  Sitting with feet on ground, knees hip width apart, not crossed

## 2021-03-12 ENCOUNTER — Encounter: Payer: 59 | Admitting: Adult Health

## 2021-03-20 ENCOUNTER — Encounter: Payer: Self-pay | Admitting: Gastroenterology

## 2021-03-21 ENCOUNTER — Encounter
Admission: RE | Disposition: A | Discharge status: Home / Self Care | Payer: Self-pay | Source: Home / Self Care | Attending: Gastroenterology

## 2021-03-21 ENCOUNTER — Ambulatory Visit: Payer: 59 | Admitting: Anesthesiology

## 2021-03-21 ENCOUNTER — Ambulatory Visit: Payer: 59

## 2021-03-21 ENCOUNTER — Ambulatory Visit
Admission: RE | Admit: 2021-03-21 | Discharge: 2021-03-21 | Disposition: A | Discharge status: Home / Self Care | Payer: 59 | Attending: Gastroenterology | Admitting: Gastroenterology

## 2021-03-21 DIAGNOSIS — Q438 Other specified congenital malformations of intestine: Secondary | ICD-10-CM | POA: Diagnosis not present

## 2021-03-21 DIAGNOSIS — K5730 Diverticulosis of large intestine without perforation or abscess without bleeding: Secondary | ICD-10-CM | POA: Insufficient documentation

## 2021-03-21 DIAGNOSIS — R109 Unspecified abdominal pain: Secondary | ICD-10-CM

## 2021-03-21 DIAGNOSIS — K635 Polyp of colon: Secondary | ICD-10-CM

## 2021-03-21 DIAGNOSIS — Z86010 Personal history of colonic polyps: Secondary | ICD-10-CM | POA: Diagnosis not present

## 2021-03-21 DIAGNOSIS — D123 Benign neoplasm of transverse colon: Secondary | ICD-10-CM | POA: Insufficient documentation

## 2021-03-21 DIAGNOSIS — Z79890 Hormone replacement therapy (postmenopausal): Secondary | ICD-10-CM | POA: Insufficient documentation

## 2021-03-21 DIAGNOSIS — Z1211 Encounter for screening for malignant neoplasm of colon: Secondary | ICD-10-CM | POA: Diagnosis present

## 2021-03-21 DIAGNOSIS — D120 Benign neoplasm of cecum: Secondary | ICD-10-CM | POA: Insufficient documentation

## 2021-03-21 DIAGNOSIS — Z885 Allergy status to narcotic agent status: Secondary | ICD-10-CM | POA: Diagnosis not present

## 2021-03-21 DIAGNOSIS — K621 Rectal polyp: Secondary | ICD-10-CM | POA: Insufficient documentation

## 2021-03-21 DIAGNOSIS — Z79899 Other long term (current) drug therapy: Secondary | ICD-10-CM | POA: Diagnosis not present

## 2021-03-21 HISTORY — PX: COLONOSCOPY WITH PROPOFOL: SHX5780

## 2021-03-21 IMAGING — DX DG ABDOMEN 1V
1 series · 1 of 1 positions shown · non-contrast
Comparison: None.

CLINICAL DATA: Abdominal pain

EXAM:
ABDOMEN - 1 VIEW

[abdomen supine]
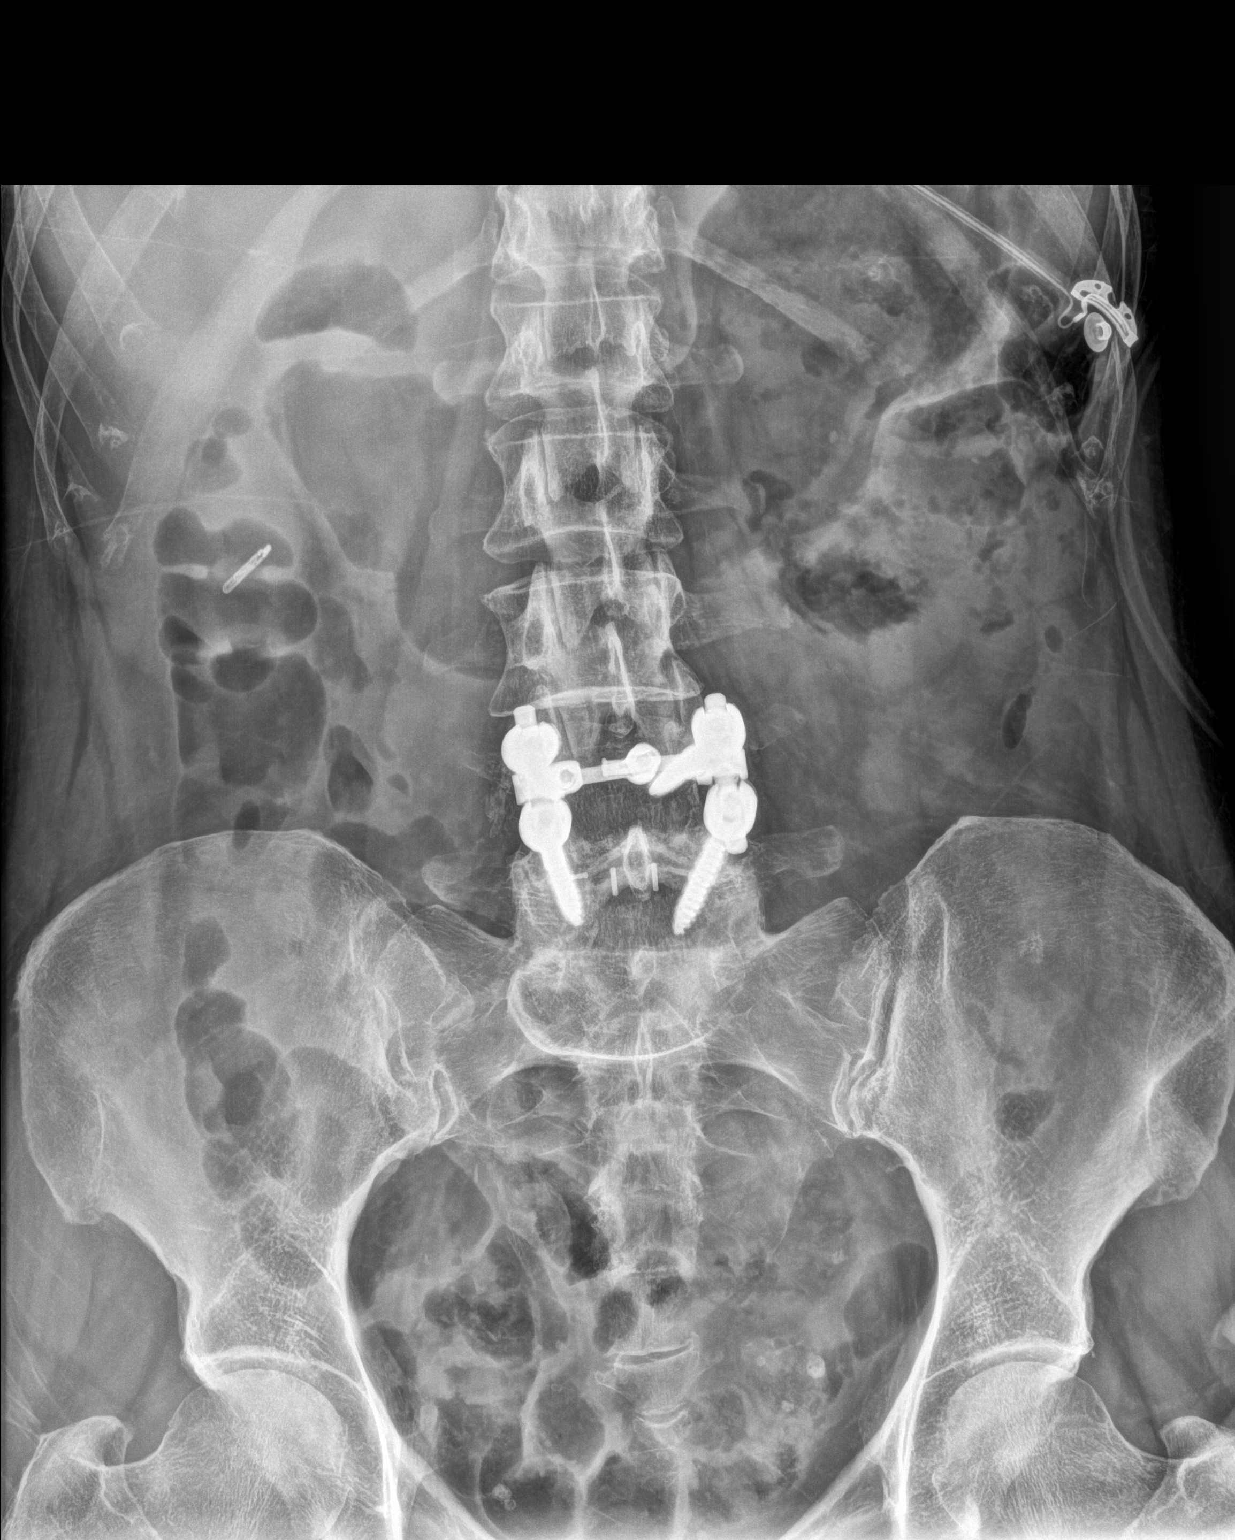

[1 of 1 positions shown; findings below may reference images not displayed]

FINDINGS: Bowel-gas pattern appears within normal limits. No abnormally
distended gas-filled loops of bowel to suggest obstruction. No
evidence of free air. Cholecystectomy clips.

No suspicious calcifications identified.
IMPRESSION: No acute process identified.

## 2021-03-21 SURGERY — COLONOSCOPY WITH PROPOFOL
Anesthesia: General

## 2021-03-21 MED ORDER — PROPOFOL 500 MG/50ML IV EMUL
INTRAVENOUS | Status: DC | PRN
  Administered 2021-03-21: 50 ug/kg/min via INTRAVENOUS

## 2021-03-21 MED ORDER — LIDOCAINE HCL (PF) 2 % IJ SOLN
INTRAMUSCULAR | Status: AC
  Filled 2021-03-21: qty 5

## 2021-03-21 MED ORDER — MIDAZOLAM HCL 2 MG/2ML IJ SOLN
INTRAMUSCULAR | Status: DC | PRN
  Administered 2021-03-21: 2 mg via INTRAVENOUS

## 2021-03-21 MED ORDER — FENTANYL CITRATE (PF) 100 MCG/2ML IJ SOLN
INTRAMUSCULAR | Status: AC
  Filled 2021-03-21: qty 2

## 2021-03-21 MED ORDER — LIDOCAINE HCL (CARDIAC) PF 100 MG/5ML IV SOSY
PREFILLED_SYRINGE | INTRAVENOUS | Status: DC | PRN
  Administered 2021-03-21: 50 mg via INTRAVENOUS

## 2021-03-21 MED ORDER — DEXMEDETOMIDINE HCL IN NACL 200 MCG/50ML IV SOLN
INTRAVENOUS | Status: AC
  Filled 2021-03-21: qty 50

## 2021-03-21 MED ORDER — DEXMEDETOMIDINE HCL IN NACL 400 MCG/100ML IV SOLN
INTRAVENOUS | Status: DC | PRN
  Administered 2021-03-21: 4 ug via INTRAVENOUS
  Administered 2021-03-21: 8 ug via INTRAVENOUS
  Administered 2021-03-21 (×2): 4 ug via INTRAVENOUS

## 2021-03-21 MED ORDER — EPHEDRINE SULFATE 50 MG/ML IJ SOLN
INTRAMUSCULAR | Status: DC | PRN
  Administered 2021-03-21 (×2): 10 mg via INTRAVENOUS

## 2021-03-21 MED ORDER — PROPOFOL 10 MG/ML IV BOLUS
INTRAVENOUS | Status: DC | PRN
  Administered 2021-03-21: 50 mg via INTRAVENOUS

## 2021-03-21 MED ORDER — SODIUM CHLORIDE 0.9 % IV SOLN
INTRAVENOUS | Status: DC
  Administered 2021-03-21: 20 mL/h via INTRAVENOUS

## 2021-03-21 MED ORDER — PROPOFOL 500 MG/50ML IV EMUL
INTRAVENOUS | Status: AC
  Filled 2021-03-21: qty 50

## 2021-03-21 MED ORDER — MIDAZOLAM HCL 2 MG/2ML IJ SOLN
INTRAMUSCULAR | Status: AC
  Filled 2021-03-21: qty 2

## 2021-03-21 MED ORDER — FENTANYL CITRATE (PF) 100 MCG/2ML IJ SOLN
INTRAMUSCULAR | Status: DC | PRN
  Administered 2021-03-21 (×2): 50 ug via INTRAVENOUS

## 2021-03-21 NOTE — Progress Notes (Signed)
Pt reported abdominal pain post procedure. I examined the patient and her abdomen was soft. Pain was reported to be in the LUQ. After passing flatus the pain improved significantly. Abdominal X-ray obtained and did not show acute process. Pt remained hemodynamically stable throughout. Pain was likely due to prolonged procedure time as explained in procedure report and from distension of colon. Improved in pain after passing flatus consistent with the same. If pain worsens or reoccurs pt was advised to call us.

## 2021-03-21 NOTE — Transfer of Care (Signed)
Immediate Anesthesia Transfer of Care Note  Patient: FREDRICA CAPANO  Procedure(s) Performed: COLONOSCOPY WITH PROPOFOL  Patient Location: PACU  Anesthesia Type:General  Level of Consciousness: sedated  Airway & Oxygen Therapy: Patient Spontanous Breathing and Patient connected to nasal cannula oxygen  Post-op Assessment: Report given to RN and Post -op Vital signs reviewed and stable  Post vital signs: Reviewed and stable  Last Vitals:  Vitals Value Taken Time  BP 82/54 03/21/21 0937  Temp 36.4 C 03/21/21 0937  Pulse 74 03/21/21 0938  Resp 17 03/21/21 0938  SpO2 100 % 03/21/21 0938  Vitals shown include unvalidated device data.  Last Pain:  Vitals:   03/21/21 0937  TempSrc: Temporal  PainSc: 0-No pain         Complications: No notable events documented.

## 2021-03-21 NOTE — Op Note (Signed)
[facility] Gastroenterology Patient Name: Luca Burston Procedure Date: 03/21/2021 7:03 AM MRN: [id number] Account #: [id number] Date of Birth: 06-12-1960 Admit Type: Outpatient Age: 60 Room: [facility] ENDO ROOM 3 Gender: Female Note Status: Finalized Instrument Name: Colonoscope [id number],Colonscope [id number] Procedure:             Colonoscopy Indications:           High risk colon cancer surveillance: Personal history                         of colonic polyps Providers:             Jet Traynham B. Bonna Gains MD, MD Referring MD:          Kelby Aline. Flinchum (Referring MD) Medicines:             Monitored Anesthesia Care Complications:         No immediate complications. Procedure:             Pre-Anesthesia Assessment:                        - ASA Grade Assessment: II - A patient with mild                         systemic disease.                        - Prior to the procedure, a History and Physical was                         performed, and patient medications, allergies and                         sensitivities were reviewed. The patient's tolerance                         of previous anesthesia was reviewed.                        - The risks and benefits of the procedure and the                         sedation options and risks were discussed with the                         patient. All questions were answered and informed                         consent was obtained.                        - Patient identification and proposed procedure were                         verified prior to the procedure by the physician, the                         nurse, the anesthesiologist, the anesthetist and the  technician. The procedure was verified in the                         procedure room.                        After obtaining informed consent, the colonoscope was                         passed under direct vision. Throughout the procedure,                          the patient's blood pressure, pulse, and oxygen                         saturations were monitored continuously. The                         Colonoscope was introduced through the anus and                         advanced to the the cecum, identified by appendiceal                         orifice and ileocecal valve. The colonoscopy was                         performed with ease. The patient tolerated the                         procedure well. The quality of the bowel preparation                         was good. The Colonoscope was introduced through the                         anus and advanced to the the cecum, identified by                         appendiceal orifice and ileocecal valve. The initial                         colonoscope malfunctioned and there was no lens water,                         or CO2 or air coming out from the colonoscope during                         withdrawal once the colonoscope reached the hepatic                         flexure. Troubleshooting including changing buttons                         and tubing was not successful and the colonoscope had                         to be changed. This resolved the issue. This also  lead                         to a prolonged procedure duration of over an hour. Findings:      The perianal and digital rectal examinations were normal.      Three flat and sessile polyps were found in the rectum, hepatic flexure       and cecum. The polyps were 4 to 7 mm in size. These polyps were removed       with a jumbo cold forceps. Resection and retrieval were complete.      A 12 mm polyp was found in the transverse colon. The polyp was sessile.       The polyp was removed with a piecemeal technique using a cold snare.       Resection and retrieval were complete. To prevent bleeding after the       polypectomy, one hemostatic clip was successfully placed. There was no       bleeding at the end of the procedure.       Multiple diverticula were found in the entire colon.      The colon (entire examined portion) was moderately tortuous. Advancing       the scope required applying abdominal pressure.      The exam was otherwise without abnormality.      The rectum, sigmoid colon, descending colon, transverse colon, ascending       colon and cecum appeared normal.      The retroflexed view of the distal rectum and anal verge was normal and       showed no anal or rectal abnormalities. Impression:            - Three 4 to 7 mm polyps in the rectum, at the hepatic                         flexure and in the cecum, removed with a jumbo cold                         forceps. Resected and retrieved.                        - One 12 mm polyp in the transverse colon, removed                         piecemeal using a cold snare. Resected and retrieved.                         Clip was placed.                        - Diverticulosis in the entire examined colon.                        - Tortuous colon.                        - The examination was otherwise normal.                        - The rectum, sigmoid colon, descending colon,  transverse colon, ascending colon and cecum are normal.                        - The distal rectum and anal verge are normal on                         retroflexion view. Recommendation:        - Discharge patient to home (with escort).                        - Advance diet as tolerated.                        - Continue present medications.                        - Await pathology results.                        - Repeat colonoscopy date to be determined after                         pending pathology results are reviewed.                        - The findings and recommendations were discussed with                         the patient.                        - The findings and recommendations were discussed with                         the patient's family.                         - Return to primary care physician as previously                         scheduled.                        - High fiber diet. Procedure Code(s):     --- Professional ---                        [phone], Colonoscopy, flexible; with removal of                         tumor(s), polyp(s), or other lesion(s) by snare                         technique                        75102, 25, Colonoscopy, flexible; with biopsy, single                         or multiple Diagnosis Code(s):     --- Professional ---  Z86.010, Personal history of colonic polyps                        K62.1, Rectal polyp                        K63.5, Polyp of colon CPT copyright 2019 [employer]. All rights reserved. The codes documented in this report are preliminary and upon coder review may  be revised to meet current compliance requirements.  Vonda Antigua, MD Margretta Sidle B. Bonna Gains MD, MD 03/21/2021 9:46:30 AM This report has been signed electronically. Number of Addenda: 0 Note Initiated On: 03/21/2021 7:03 AM Scope Withdrawal Time: 0 hours 50 minutes 4 seconds  Total Procedure Duration: 1 hour 13 minutes 3 seconds  Estimated Blood Loss:  Estimated blood loss: none.      [facility]

## 2021-03-21 NOTE — Anesthesia Preprocedure Evaluation (Addendum)
Anesthesia Evaluation  Patient identified by MRN, date of birth, ID band Patient awake    Reviewed: Allergy & Precautions, NPO status , Patient's Chart, lab work & pertinent test results  Airway Mallampati: I  TM Distance: >3 FB Neck ROM: Full    Dental no notable dental hx.    Pulmonary neg pulmonary ROS,    Pulmonary exam normal        Cardiovascular negative cardio ROS Normal cardiovascular exam     Neuro/Psych PSYCHIATRIC DISORDERS Anxiety PTSD Neuromuscular disease (h/o cervical radiculopathy)    GI/Hepatic Neg liver ROS, GERD  Controlled and Medicated,  Endo/Other  negative endocrine ROS  Renal/GU negative Renal ROS  negative genitourinary   Musculoskeletal negative musculoskeletal ROS (+)   Abdominal Normal abdominal exam  (+)   Peds negative pediatric ROS (+)  Hematology negative hematology ROS (+)   Anesthesia Other Findings   Reproductive/Obstetrics negative OB ROS                            Anesthesia Physical Anesthesia Plan  ASA: 2  Anesthesia Plan: General   Post-op Pain Management:    Induction: Intravenous  PONV Risk Score and Plan: TIVA and Treatment may vary due to age or medical condition  Airway Management Planned: Natural Airway and Nasal Cannula  Additional Equipment:   Intra-op Plan:   Post-operative Plan:   Informed Consent: I have reviewed the patients History and Physical, chart, labs and discussed the procedure including the risks, benefits and alternatives for the proposed anesthesia with the patient or authorized representative who has indicated his/her understanding and acceptance.     Dental advisory given  Plan Discussed with: CRNA and Anesthesiologist  Anesthesia Plan Comments:         Anesthesia Quick Evaluation

## 2021-03-21 NOTE — H&P (Signed)
Vonda Antigua, MD [address] [place], [place], [place], [address] [address], [place], [place], [place], [address] Phone: [phone]  Fax: [phone]  Primary Care Physician:  Doreen Beam, FNP   Pre-Procedure History & Physical: HPI:  Lisa Gonzalez is a 60 y.o. female is here for a colonoscopy.   Past Medical History:  Diagnosis Date   Allergy    Anxiety    Back pain    Cyst of left breast    GERD (gastroesophageal reflux disease)    History of endometriosis    History of ovarian cyst    PTSD (post-traumatic stress disorder)     Past Surgical History:  Procedure Laterality Date   LAPAROSCOPIC VAGINAL HYSTERECTOMY     SPINAL FUSION  2018   SPINE SURGERY      Prior to Admission medications   Medication Sig Start Date End Date Taking? Authorizing Provider  ALPRAZolam Duanne Moron) 0.5 MG tablet Take 1 tablet (0.5 mg total) by mouth 3 (three) times daily. 02/07/21  Yes Dutch Quint B, FNP  clotrimazole-betamethasone (LOTRISONE) cream Apply externally BID for 2 wks 5/80/99  Yes Copland, Alicia B, PA-C  estradiol (VIVELLE-DOT) 0.075 MG/24HR Place 1 patch onto the skin 2 (two) times a week. 8/33/82  Yes Copland, Elmo Putt B, PA-C  Eszopiclone 3 MG TABS TAKE 1 TAB AT BEDTIME AS NEEDED FOR SLEEP. MAY TRY 1/2 TAB 1ST TO SEE EFFECTIVE BEFORE FULL TABLET. 08/30/20  Yes Flinchum, Kelby Aline, FNP  loratadine (CLARITIN) 10 MG tablet Take by mouth.   Yes [provider]  methocarbamol (ROBAXIN) 500 MG tablet Take 1 tablet (500 mg total) by mouth 4 (four) times daily. 09/08/20  Yes Flinchum, Kelby Aline, FNP  omeprazole (PRILOSEC) 40 MG capsule Take 40 mg by mouth 2 (two) times daily. 03/02/19  Yes [provider]  SUMAtriptan (IMITREX) 100 MG tablet Take by mouth. 11/02/14  Yes [provider]    Allergies as of 02/15/2021 - Review Complete 10/25/2020  Allergen Reaction Noted   Meperidine Swelling and Nausea Only 06/13/2014   Hydrocodone Nausea  And Vomiting, Swelling, and Nausea Only 06/13/2014   Hydromorphone Nausea And Vomiting and Nausea Only 11/04/2014   Opium Other (See Comments) 06/13/2014   Oxycodone Nausea And Vomiting and Nausea Only 06/13/2014    Family History  Problem Relation Age of Onset   Autoimmune disease Mother    Heart attack Father 51   Cancer - Colon Maternal Grandmother    Alzheimer's disease Maternal Grandmother    Heart attack Maternal Grandfather    Bone cancer Paternal Grandmother     Social History   Socioeconomic History   Marital status: Married    Spouse name: Not on file   Number of children: Not on file   Years of education: Not on file   Highest education level: Not on file  Occupational History   Not on file  Tobacco Use   Smoking status: Never   Smokeless tobacco: Never  Vaping Use   Vaping Use: Never used  Substance and Sexual Activity   Alcohol use: Yes    Alcohol/week: 1.0 standard drink    Types: 1 Glasses of wine per week   Drug use: Never   Sexual activity: Yes    Birth control/protection: None, Surgical    Comment: Hysterectomy  Other Topics Concern   Not on file  Social History Narrative   Not on file   Social Determinants of Health   Financial Resource Strain: Not on  file  Food Insecurity: Not on file  Transportation Needs: Not on file  Physical Activity: Not on file  Stress: Not on file  Social Connections: Not on file  Intimate Partner Violence: Not on file    Review of Systems: See HPI, otherwise negative ROS  Physical Exam: Constitutional: General:   Alert,  Well-developed, well-nourished, pleasant and cooperative in NAD BP 109/67   Pulse 82   Temp (!) 95.3 F (35.2 C) (Temporal)   Resp 20   Ht 5\' 8"  (1.727 m)   Wt 66.2 kg   SpO2 100%   BMI 22.20 kg/m   Head: Normocephalic, atraumatic.   Eyes:  Sclera clear, no icterus.   Conjunctiva pink.   Mouth:  No deformity or lesions, oropharynx pink & moist.  Neck:  Supple, trachea  midline  Respiratory: Normal respiratory effort  Gastrointestinal:  Soft, non-tender and non-distended without masses, hepatosplenomegaly or hernias noted.  No guarding or rebound tenderness.     Cardiac: No clubbing or edema.  No cyanosis. Normal posterior tibial pedal pulses noted.  Lymphatic:  No significant cervical adenopathy.  Psych:  Alert and cooperative. Normal mood and affect.  Musculoskeletal:   Symmetrical without gross deformities. 5/5 Lower extremity strength bilaterally.  Skin: Warm. Intact without significant lesions or rashes. No jaundice.  Neurologic:  Face symmetrical, tongue midline, Normal sensation to touch;  grossly normal neurologically.  Psych:  Alert and oriented x3, Alert and cooperative. Normal mood and affect.  Impression/Plan: Lisa Gonzalez is here for a colonoscopy to be performed for history of colon polyps. Pt reports having a colonoscopy in 2016 and states 1 polyp was removed. Procedure or pathology report not available. Reports history of colon cancer in grandmother  Risks, benefits, limitations, and alternatives regarding  colonoscopy have been reviewed with the patient.  Questions have been answered.  All parties agreeable.   Virgel Manifold, MD  03/21/2021, 8:12 AM

## 2021-03-21 NOTE — Anesthesia Postprocedure Evaluation (Signed)
Anesthesia Post Note  Patient: Lisa Gonzalez  Procedure(s) Performed: COLONOSCOPY WITH PROPOFOL  Patient location during evaluation: Endoscopy Anesthesia Type: General Level of consciousness: awake and alert Pain management: pain level controlled Vital Signs Assessment: post-procedure vital signs reviewed and stable Respiratory status: spontaneous breathing, nonlabored ventilation and respiratory function stable Cardiovascular status: blood pressure returned to baseline and stable Postop Assessment: no apparent nausea or vomiting Anesthetic complications: no   No notable events documented.   Last Vitals:  Vitals:   03/21/21 0957 03/21/21 1002  BP: (!) 83/57 (!) 87/59  Pulse:    Resp:    Temp:    SpO2:      Last Pain:  Vitals:   03/21/21 1002  TempSrc:   PainSc: 2                  [place] Ouch

## 2021-03-22 ENCOUNTER — Encounter: Payer: Self-pay | Admitting: Gastroenterology

## 2021-03-22 LAB — SURGICAL PATHOLOGY

## 2021-03-26 ENCOUNTER — Ambulatory Visit: Payer: 59 | Admitting: Physical Therapy

## 2021-03-26 ENCOUNTER — Other Ambulatory Visit: Payer: Self-pay

## 2021-03-26 DIAGNOSIS — R2689 Other abnormalities of gait and mobility: Secondary | ICD-10-CM | POA: Diagnosis not present

## 2021-03-26 DIAGNOSIS — M6208 Separation of muscle (nontraumatic), other site: Secondary | ICD-10-CM

## 2021-03-26 DIAGNOSIS — M25551 Pain in right hip: Secondary | ICD-10-CM

## 2021-03-26 DIAGNOSIS — R102 Pelvic and perineal pain: Secondary | ICD-10-CM

## 2021-03-26 DIAGNOSIS — M79672 Pain in left foot: Secondary | ICD-10-CM
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
## 2021-03-29 ENCOUNTER — Encounter: Payer: 59 | Admitting: Physical Therapy

## 2021-04-02 ENCOUNTER — Ambulatory Visit: Payer: 59 | Attending: Orthopedic Surgery | Admitting: Physical Therapy

## 2021-04-02 ENCOUNTER — Other Ambulatory Visit: Payer: Self-pay

## 2021-04-02 DIAGNOSIS — R2689 Other abnormalities of gait and mobility: Secondary | ICD-10-CM | POA: Diagnosis present

## 2021-04-02 DIAGNOSIS — R102 Pelvic and perineal pain: Secondary | ICD-10-CM | POA: Insufficient documentation

## 2021-04-02 DIAGNOSIS — M533 Sacrococcygeal disorders, not elsewhere classified: Secondary | ICD-10-CM | POA: Insufficient documentation

## 2021-04-02 DIAGNOSIS — M6208 Separation of muscle (nontraumatic), other site: Secondary | ICD-10-CM | POA: Diagnosis present

## 2021-04-02 DIAGNOSIS — M79672 Pain in left foot: Secondary | ICD-10-CM | POA: Diagnosis present

## 2021-04-02 DIAGNOSIS — M25551 Pain in right hip: Secondary | ICD-10-CM | POA: Insufficient documentation
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
## 2021-04-02 NOTE — Patient Instructions (Signed)
Epson soak for feet   __  Feet care :  Self -feet massage   Handshake : fingers between toes, moving ballmounds/toes back and forth several times while other hand anchors at arch. Do the same at the hind/mid foot.  Heel to toes upward to a letter Big Letter T strokes to spread ballmounds and toes, several times, pinch between webs of toes  Run finger tips along top of foot between long bones "comb between the bones"    Wiggle toes and spread them out when relaxing   __   [place] Band at waist connected to doorknob 12mins Stepping forward normal length steps, planting mid and forefoot down, center of mass ( navel) leans forward slightly as if you were walking uphill 3-4 steps till band feels taut ( MAKE SURE THE DOOR IS LOCKED AND WON'T OPEN)   Stepping backwards, lower heel slowly, carry trunk and hips back , leaning forward, front knee along 2-3 rd toe line    2 min  ____

## 2021-04-12 ENCOUNTER — Encounter: Payer: 59 | Admitting: Physical Therapy

## 2021-04-16 ENCOUNTER — Ambulatory Visit: Payer: 59 | Admitting: Physical Therapy

## 2021-04-16 ENCOUNTER — Other Ambulatory Visit: Payer: Self-pay

## 2021-04-16 DIAGNOSIS — M79672 Pain in left foot: Secondary | ICD-10-CM

## 2021-04-16 DIAGNOSIS — R2689 Other abnormalities of gait and mobility: Secondary | ICD-10-CM | POA: Diagnosis not present

## 2021-04-16 DIAGNOSIS — M6208 Separation of muscle (nontraumatic), other site: Secondary | ICD-10-CM

## 2021-04-16 DIAGNOSIS — M25551 Pain in right hip: Secondary | ICD-10-CM

## 2021-04-16 DIAGNOSIS — R102 Pelvic and perineal pain: Secondary | ICD-10-CM

## 2021-04-16 NOTE — Patient Instructions (Signed)
  Proper body mechanics with getting out of a chair to decrease strain  on back &pelvic floor   Avoid holding your breath when Getting out of the chair:  Scoot to front part of chair chair Heels behind knees, feet are hip width apart, nose over toes  Inhale like you are smelling roses Exhale to stand   5reps, 3 x day __  Feet slides :   Points of contact at sitting bones  Four points of contact of foot, Heel up, ankle not twist out Lower heel Four points of contact of foot, Slide foot back   Repeated with other foot   2 min  1 x day   ___  Band walking exercise 3 x day  ___  Remove orthotics  Withhold elliptical
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
## 2021-04-23 ENCOUNTER — Other Ambulatory Visit: Payer: Self-pay

## 2021-04-23 ENCOUNTER — Ambulatory Visit: Payer: 59 | Admitting: Physical Therapy

## 2021-04-23 DIAGNOSIS — R2689 Other abnormalities of gait and mobility: Secondary | ICD-10-CM

## 2021-04-23 DIAGNOSIS — M79672 Pain in left foot: Secondary | ICD-10-CM

## 2021-04-23 DIAGNOSIS — M6208 Separation of muscle (nontraumatic), other site: Secondary | ICD-10-CM

## 2021-04-23 DIAGNOSIS — R102 Pelvic and perineal pain: Secondary | ICD-10-CM

## 2021-04-23 DIAGNOSIS — M25551 Pain in right hip: Secondary | ICD-10-CM
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
## 2021-04-30 ENCOUNTER — Encounter: Payer: 59 | Admitting: Physical Therapy

## 2021-05-02 ENCOUNTER — Telehealth: Payer: Self-pay | Admitting: Adult Health

## 2021-05-02 ENCOUNTER — Other Ambulatory Visit: Payer: Self-pay

## 2021-05-02 ENCOUNTER — Encounter: Payer: Self-pay | Admitting: Adult Health

## 2021-05-02 ENCOUNTER — Ambulatory Visit (INDEPENDENT_AMBULATORY_CARE_PROVIDER_SITE_OTHER): Payer: 59 | Admitting: Adult Health

## 2021-05-02 VITALS — BP 90/60 | HR 79 | Temp 97.0°F | Ht 67.75 in | Wt 153.5 lb

## 2021-05-02 DIAGNOSIS — Z8669 Personal history of other diseases of the nervous system and sense organs: Secondary | ICD-10-CM

## 2021-05-02 DIAGNOSIS — I739 Peripheral vascular disease, unspecified: Secondary | ICD-10-CM

## 2021-05-02 DIAGNOSIS — G4700 Insomnia, unspecified: Secondary | ICD-10-CM | POA: Diagnosis not present

## 2021-05-02 DIAGNOSIS — F419 Anxiety disorder, unspecified: Secondary | ICD-10-CM | POA: Diagnosis not present

## 2021-05-02 DIAGNOSIS — M25552 Pain in left hip: Secondary | ICD-10-CM | POA: Diagnosis not present

## 2021-05-02 DIAGNOSIS — G8929 Other chronic pain: Secondary | ICD-10-CM

## 2021-05-02 DIAGNOSIS — L819 Disorder of pigmentation, unspecified: Secondary | ICD-10-CM

## 2021-05-02 MED ORDER — ESZOPICLONE 3 MG PO TABS: ORAL_TABLET | ORAL | 0 refills | Status: DC

## 2021-05-02 MED ORDER — ALPRAZOLAM 0.5 MG PO TABS: 0.5000 mg | ORAL_TABLET | Freq: Three times a day (TID) | ORAL | 0 refills | Status: DC | PRN

## 2021-05-02 MED ORDER — SUMATRIPTAN SUCCINATE 100 MG PO TABS: ORAL_TABLET | ORAL | 0 refills | Status: DC

## 2021-05-02 NOTE — Progress Notes (Signed)
Established Patient Office Visit  Subjective:  Patient ID: Lisa Gonzalez, female    DOB: [birth year]  Age: 60 y.o. MRN: [id number]  CC:  Chief Complaint  Patient presents with   Establish Care    With [facility]    HPI Lisa Gonzalez presents for follow up. She   Pain with walking claudication.Right Hip pain seen Dr. Harlow Mares was told bursitis but after MRI. Pain with feet hurting.  Seeing PT. Sitting pain in right hip and goes to her leg/ foot.  Denies any swelling.  Patient was told by Dr. Harlow Mares that he could not do anything.  That she does not have any bursitis or any other symptoms in the hip but patient reports she has constant hip pain daily.   She has an area on her back she would like me to look at right upper back / neck area. She sees dermatology appointment in March 22, seeing [facility].  Diastasis recti Re[facility].   Colonoscopy with Dr. Isaiah Blakes  - precancerous  - repeat one year.  [facility].   Patient  denies any fever, body aches,chills, rash, chest pain, shortness of breath, nausea, vomiting, or diarrhea.  Denies dizziness, lightheadedness, pre syncopal or syncopal episodes.    Past Medical History:  Diagnosis Date   Allergy    Anxiety    Back pain    Cyst of left breast    GERD (gastroesophageal reflux disease)    History of endometriosis    History of ovarian cyst    PTSD (post-traumatic stress disorder)     Past Surgical History:  Procedure Laterality Date   COLONOSCOPY WITH PROPOFOL N/A 03/21/2021   Procedure: COLONOSCOPY WITH PROPOFOL;  Surgeon: Virgel Manifold, MD;  Location: [facility] ENDOSCOPY;  Service: Endoscopy;  Laterality: N/A;   LAPAROSCOPIC VAGINAL HYSTERECTOMY     SPINAL FUSION  2018   SPINE SURGERY      Family History  Problem Relation Age of Onset   Autoimmune disease Mother    Heart attack Father 14   Cancer - Colon Maternal Grandmother    Alzheimer's disease Maternal Grandmother    Heart  attack Maternal Grandfather    Bone cancer Paternal Grandmother     Social History   Socioeconomic History   Marital status: Married    Spouse name: Not on file   Number of children: Not on file   Years of education: Not on file   Highest education level: Not on file  Occupational History   Not on file  Tobacco Use   Smoking status: Never   Smokeless tobacco: Never  Vaping Use   Vaping Use: Never used  Substance and Sexual Activity   Alcohol use: Yes    Alcohol/week: 1.0 standard drink    Types: 1 Glasses of wine per week   Drug use: Never   Sexual activity: Yes    Birth control/protection: None, Surgical    Comment: Hysterectomy  Other Topics Concern   Not on file  Social History Narrative   Not on file   Social Determinants of Health   Financial Resource Strain: Not on file  Food Insecurity: Not on file  Transportation Needs: Not on file  Physical Activity: Not on file  Stress: Not on file  Social Connections: Not on file  Intimate Partner Violence: Not on file    Outpatient Medications Prior to Visit  Medication Sig Dispense Refill   estradiol (VIVELLE-DOT) 0.075 MG/24HR Place 1 patch onto the  skin 2 (two) times a week. 24 patch 3   loratadine (CLARITIN) 10 MG tablet Take 10 mg by mouth daily as needed.     methocarbamol (ROBAXIN) 500 MG tablet Take 1 tablet (500 mg total) by mouth 4 (four) times daily. (Patient taking differently: Take 500 mg by mouth every 8 (eight) hours as needed.) 90 tablet 3   omeprazole (PRILOSEC) 40 MG capsule Take 40 mg by mouth 2 (two) times daily as needed.     ALPRAZolam (XANAX) 0.5 MG tablet Take 1 tablet (0.5 mg total) by mouth 3 (three) times daily. (Patient taking differently: Take 0.5 mg by mouth 2 (two) times daily as needed.) 90 tablet 0   Eszopiclone 3 MG TABS TAKE 1 TAB AT BEDTIME AS NEEDED FOR SLEEP. MAY TRY 1/2 TAB 1ST TO SEE EFFECTIVE BEFORE FULL TABLET. 30 tablet 0   SUMAtriptan (IMITREX) 100 MG tablet Take by mouth every  2 (two) hours as needed for migraine or headache.     clotrimazole-betamethasone (LOTRISONE) cream Apply externally BID for 2 wks (Patient not taking: Reported on 05/02/2021) 15 g 0   SUPREP BOWEL PREP KIT 17.5-3.13-1.6 GM/177ML SOLN See admin instructions.     No facility-administered medications prior to visit.    Allergies  Allergen Reactions   Meperidine Swelling and Nausea Only    Blood Pressure Dropped Low BP and pass out.    Hydrocodone Nausea And Vomiting, Swelling and Nausea Only    Hot and cold chills, vomiting, headache, and chest pain  Other reaction(s): Vomiting Hot and cold chills, headache, chest pain   Hydromorphone Nausea And Vomiting and Nausea Only    Other reaction(s): Vomiting   Opium Other (See Comments)    Hot and cold chills, vomiting, headache and chest pain   Oxycodone Nausea And Vomiting and Nausea Only    Other reaction(s): Other (comments) Hot and cold chills, headache, vomiting, and chest pain.     ROS Review of Systems  Constitutional: Negative.   HENT: Negative.    Respiratory: Negative.    Cardiovascular: Negative.   Gastrointestinal: Negative.   Genitourinary: Negative.   Musculoskeletal:  Positive for arthralgias.  Skin:  Positive for color change.  Neurological: Negative.      Objective:    Physical Exam Vitals reviewed.  Constitutional:      General: She is not in acute distress.    Appearance: She is well-developed. She is not diaphoretic.     Interventions: She is not intubated. HENT:     Head: Normocephalic and atraumatic.     Right Ear: External ear normal.     Left Ear: External ear normal.     Nose: Nose normal.     Mouth/Throat:     Pharynx: No oropharyngeal exudate.  Eyes:     General: Lids are normal. No scleral icterus.       Right eye: No discharge.        Left eye: No discharge.     Conjunctiva/sclera: Conjunctivae normal.     Right eye: Right conjunctiva is not injected. No exudate or hemorrhage.    Left  eye: Left conjunctiva is not injected. No exudate or hemorrhage.    Pupils: Pupils are equal, round, and reactive to light.  Neck:     Thyroid: No thyroid mass or thyromegaly.     Vascular: Normal carotid pulses. No carotid bruit, hepatojugular reflux or JVD.     Trachea: Trachea and phonation normal. No tracheal tenderness or tracheal deviation.  Meningeal: Brudzinski's sign and Kernig's sign absent.  Cardiovascular:     Rate and Rhythm: Normal rate and regular rhythm.     Pulses: Normal pulses.          Radial pulses are 2+ on the right side and 2+ on the left side.       Dorsalis pedis pulses are 2+ on the right side and 2+ on the left side.       Posterior tibial pulses are 2+ on the right side and 2+ on the left side.     Heart sounds: Normal heart sounds, S1 normal and S2 normal. Heart sounds not distant. No murmur heard.   No friction rub. No gallop.  Pulmonary:     Effort: Pulmonary effort is normal. No tachypnea, bradypnea, accessory muscle usage or respiratory distress. She is not intubated.     Breath sounds: Normal breath sounds. No stridor. No wheezing, rhonchi or rales.  Chest:     Chest wall: No tenderness.  Abdominal:     General: Bowel sounds are normal. There is no distension or abdominal bruit.     Palpations: Abdomen is soft. There is no shifting dullness, fluid wave, hepatomegaly, splenomegaly, mass or pulsatile mass.     Tenderness: There is no abdominal tenderness. There is no right CVA tenderness, left CVA tenderness, guarding or rebound.     Hernia: No hernia is present.  Musculoskeletal:        General: No tenderness or deformity. Normal range of motion.     Cervical back: Full passive range of motion without pain, normal range of motion and neck supple. No edema, erythema, rigidity or tenderness. No spinous process tenderness or muscular tenderness. Normal range of motion.  Lymphadenopathy:     Head:     Right side of head: No submental, submandibular,  tonsillar, preauricular, posterior auricular or occipital adenopathy.     Left side of head: No submental, submandibular, tonsillar, preauricular, posterior auricular or occipital adenopathy.     Cervical: No cervical adenopathy.     Right cervical: No superficial, deep or posterior cervical adenopathy.    Left cervical: No superficial, deep or posterior cervical adenopathy.     Upper Body:     Right upper body: No supraclavicular or pectoral adenopathy.     Left upper body: No supraclavicular or pectoral adenopathy.  Skin:    General: Skin is warm and dry.     Coloration: Skin is not jaundiced or pale.     Findings: Lesion present. No abrasion, bruising, burn, ecchymosis, erythema, petechiae or rash.     Nails: There is no clubbing.       Neurological:     Mental Status: She is alert and oriented to person, place, and time.     GCS: GCS eye subscore is 4. GCS verbal subscore is 5. GCS motor subscore is 6.     Cranial Nerves: No cranial nerve deficit.     Sensory: No sensory deficit.     Motor: No weakness, tremor, atrophy, abnormal muscle tone or seizure activity.     Coordination: Coordination normal.     Gait: Gait normal.     Deep Tendon Reflexes: Reflexes are normal and symmetric. Reflexes normal. Babinski sign absent on the right side. Babinski sign absent on the left side.     Reflex Scores:      Tricep reflexes are 2+ on the right side and 2+ on the left side.      Bicep reflexes  are 2+ on the right side and 2+ on the left side.      Brachioradialis reflexes are 2+ on the right side and 2+ on the left side.      Patellar reflexes are 2+ on the right side and 2+ on the left side.      Achilles reflexes are 2+ on the right side and 2+ on the left side. Psychiatric:        Mood and Affect: Mood normal.        Speech: Speech normal.        Behavior: Behavior normal.        Thought Content: Thought content normal.        Judgment: Judgment normal.    BP 90/60   Pulse 79    Temp (!) 97 F (36.1 C) (Temporal)   Ht 5' 7.75" (1.721 m)   Wt 153 lb 8 oz (69.6 kg)   SpO2 98%   BMI 23.51 kg/m  Wt Readings from Last 3 Encounters:  05/02/21 153 lb 8 oz (69.6 kg)  03/21/21 146 lb (66.2 kg)  10/25/20 151 lb (68.5 kg)     Health Maintenance Due  Topic Date Due   Hepatitis C Screening  Never done   TETANUS/TDAP  Never done   Zoster Vaccines- Shingrix (1 of 2) Never done   INFLUENZA VACCINE  Never done    There are no preventive care reminders to display for this patient.  Lab Results  Component Value Date   TSH 2.190 09/22/2020   Lab Results  Component Value Date   WBC 3.6 09/22/2020   HGB 13.9 09/22/2020   HCT 40.8 09/22/2020   MCV 94 09/22/2020   PLT 252 09/22/2020   Lab Results  Component Value Date   NA 141 09/22/2020   K 4.2 09/22/2020   CO2 27 09/22/2020   GLUCOSE 95 09/22/2020   BUN 21 09/22/2020   CREATININE 0.98 09/22/2020   BILITOT 0.6 09/22/2020   ALKPHOS 50 09/22/2020   AST 19 09/22/2020   ALT 22 09/22/2020   PROT 6.8 09/22/2020   ALBUMIN 4.3 09/22/2020   CALCIUM 9.4 09/22/2020   EGFR 66 09/22/2020   Lab Results  Component Value Date   CHOL 238 (H) 09/22/2020   Lab Results  Component Value Date   HDL 53 09/22/2020   Lab Results  Component Value Date   LDLCALC 170 (H) 09/22/2020   Lab Results  Component Value Date   TRIG 88 09/22/2020   No results found for: CHOLHDL Lab Results  Component Value Date   HGBA1C 5.6 08/18/2019      Assessment & Plan:   Problem List Items Addressed This Visit       Other   Anxiety   Relevant Medications   ALPRAZolam (XANAX) 0.5 MG tablet   History of migraine   Relevant Medications   SUMAtriptan (IMITREX) 100 MG tablet   Insomnia   Relevant Medications   Eszopiclone 3 MG TABS   Other Visit Diagnoses     Left leg claudication ([place])    -  Primary   Relevant Orders   CBC with Differential/Platelet   Comprehensive metabolic panel   TSH   VITAMIN D 25 Hydroxy (Vit-D  Deficiency, Fractures)   Lipid panel   Ambulatory referral to Vascular Surgery   US ARTERIAL ABI (SCREENING LOWER EXTREMITY)   Chronic left hip pain       Relevant Orders   CBC with Differential/Platelet   Comprehensive metabolic  panel   TSH   VITAMIN D 25 Hydroxy (Vit-D Deficiency, Fractures)   Lipid panel   Ambulatory referral to Vascular Surgery   Color change in pigmented skin lesion       Relevant Orders   Ambulatory referral to Dermatology       Meds ordered this encounter  Medications   ALPRAZolam (XANAX) 0.5 MG tablet    Sig: Take 1 tablet (0.5 mg total) by mouth 3 (three) times daily as needed for anxiety (not to take with lunesta).    Dispense:  90 tablet    Refill:  0   Eszopiclone 3 MG TABS    Sig: TAKE 1 TAB AT BEDTIME AS NEEDED FOR SLEEP. MAY TRY 1/2 TAB 1ST TO SEE EFFECTIVE BEFORE FULL TABLET.    Dispense:  30 tablet    Refill:  0    This request is for a new prescription for a controlled substance as required by Federal/State law..   SUMAtriptan (IMITREX) 100 MG tablet    Sig: Take one  tablet for migraine as needed. May repeat in 2 hours if headache persists or recurs.Maximum dose in 200 mg per day.    Dispense:  10 tablet    Refill:  0  Given her chronic history of hip pain and normal MRI of the right hip will have patient follow-up with vascular given she does report some intermittent claudication type symptoms in her left lower leg.  Referral to [facility] dermatology for changing skin lesions on the back suspicious for basal cell carcinoma Refills given for chronic medication management.  Aware of the side effects of possible medications and not to take Xanax and Lunesta together.  Red Flags discussed. The patient was given clear instructions to go to ER or return to medical center if any red flags develop, symptoms do not improve, worsen or new problems develop. They verbalized understanding.   Follow-up: Return in about 3 months (around 07/31/2021), or if  symptoms worsen or fail to improve, for at any time for any worsening symptoms, Go to Emergency room/ urgent care if worse.    Marcille Buffy, FNP

## 2021-05-02 NOTE — Telephone Encounter (Signed)
Pt called in requesting a referral for podiatry.

## 2021-05-02 NOTE — Patient Instructions (Signed)

## 2021-05-02 NOTE — Telephone Encounter (Signed)
Lft pt vm to call ofc to sch [facility]. thanks ?

## 2021-05-03 NOTE — Telephone Encounter (Signed)
Left message for patient to return call back. Need to know the reasoning behind wanting the podiatry referral.

## 2021-05-04 NOTE — Telephone Encounter (Signed)
Left message for patient to return call back. Need to know the reasoning behind wanting the podiatry referral

## 2021-05-04 NOTE — Telephone Encounter (Signed)
Pt returning call. Pt requesting callback.  °

## 2021-05-04 NOTE — Telephone Encounter (Signed)
Pt returning call regarding podiatry referral

## 2021-05-04 NOTE — Telephone Encounter (Addendum)
Please transfer patient to me  Left message for patient to return call back. Need to know the reasoning behind wanting the podiatry referral

## 2021-05-06 ENCOUNTER — Other Ambulatory Visit: Payer: Self-pay | Admitting: Adult Health

## 2021-05-06 DIAGNOSIS — M79672 Pain in left foot: Secondary | ICD-10-CM

## 2021-05-06 NOTE — Telephone Encounter (Signed)
Orders Placed This Encounter  Procedures   Ambulatory referral to Podiatry    Referral Priority:   Routine    Referral Type:   Consultation    Referral Reason:   Specialty Services Required    Referred to Provider:   Edrick Kins, DPM    Requested Specialty:   Podiatry    Number of Visits Requested:   1  REFERRAL PLACED FOR LEFT FOOT PAIN, FOLLOW UP WITH ME ADVISED IF NO IMPROVEMENT.

## 2021-05-06 NOTE — Progress Notes (Signed)
Orders Placed This Encounter  Procedures   Ambulatory referral to Podiatry    Referral Priority:   Routine    Referral Type:   Consultation    Referral Reason:   Specialty Services Required    Referred to Provider:   Edrick Kins, DPM    Requested Specialty:   Podiatry    Number of Visits Requested:   1

## 2021-05-07 ENCOUNTER — Encounter: Payer: Self-pay | Admitting: Adult Health

## 2021-05-07 ENCOUNTER — Other Ambulatory Visit: Payer: Self-pay | Admitting: Otolaryngology

## 2021-05-07 ENCOUNTER — Other Ambulatory Visit: Payer: Self-pay

## 2021-05-07 ENCOUNTER — Ambulatory Visit: Payer: 59 | Attending: Orthopedic Surgery | Admitting: Physical Therapy

## 2021-05-07 DIAGNOSIS — M6208 Separation of muscle (nontraumatic), other site: Secondary | ICD-10-CM | POA: Insufficient documentation

## 2021-05-07 DIAGNOSIS — R202 Paresthesia of skin: Secondary | ICD-10-CM

## 2021-05-07 DIAGNOSIS — M25551 Pain in right hip: Secondary | ICD-10-CM | POA: Insufficient documentation

## 2021-05-07 DIAGNOSIS — R2689 Other abnormalities of gait and mobility: Secondary | ICD-10-CM | POA: Insufficient documentation

## 2021-05-07 DIAGNOSIS — M79672 Pain in left foot: Secondary | ICD-10-CM | POA: Diagnosis present

## 2021-05-07 DIAGNOSIS — R102 Pelvic and perineal pain: Secondary | ICD-10-CM | POA: Insufficient documentation

## 2021-05-07 NOTE — Patient Instructions (Signed)
Deep core level 1 and 2   (Handout)  Twice a day   __  Modify the pirifromis stretch with strap under the opp thihg and keep shoulders and elbow and head down and relaxed   Stretches :    6 directions of the spine :  5 reps   Rotation: armswings to rotate the spine  Side flexion: arm overhead arching like a rainbow , both sides  Forward bend and strainghtening up: minisquat-  Like you are about to sit and then stand up with a slight lift in the chest   Mini squat-figure 4 (piriformis) hip flexor stretch on step hip flexor twist on step    During the day:  Stretches : (Cuing provided for proper alignment6 directions 6 directions of psine   sidebend  Rotation with trunk and arm swings  minisquat ( knees behind toes) , pull arms back, rise up, chest lifts, look up   ____  Neck / shoulder stretches:    Lying on back - small sushi roll towel under neck  _ 6 directions  5 reps  _elbow circles in and out to lower shoulder blades down and back  10 reps  _angel wings, lower elbows down , keep arms touching bed  10 reps    At work:  _Doorway squat : bear stretch for shoulder blades _wall stretch Clock stretch with head turns :  stand perpendicular to the wall, L side to the wall Tilt head to wall  Place L palm at 7 o clock Chin tuck like you are looking into armpit Look at "[place] on giant map  Swivel head with chin tucked to look in upper corner of ceiling as if you are look at  [place] on giant map "   5 reps   Switch direction, R palm on wall at 5 o 'clock   Chin tuck like you are looking into armpit Look at "[place]  on giant map  Swivel head with chin tucked to look in upper corner of ceiling as if you are look at  [facility] on giant map "   5 reps    ___  Midback stretches:   Elbow circles with hands on shoulders  10 reps    Side lean and then a palm up (high five to the sky)  And then slight rotation of ribs by looking up to the sky) 5 reps     armswings without moving the hips, sink into the legs and feet 5 reps     ___   kitchen counter stretches  Hands on kitchen counter,   Palms shoulder width apart  Minisquat postion Trunk is parallel to floor  A) Pull buttocks back to lengthen spine, knees bent  3 breaths   B) Bring R hand to the L, and stretch the R side trunk  3 breaths   Brings hands to center again Do the same to the L side stretch by placing L hand on top of R   D) Modified thread the needle R hand on L thigh, L  thigh pushing out slightly as the R hands pull in,  elbow bent and pulls to theR,  Look under L armpit   Do the same to other side
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
## 2021-05-08 ENCOUNTER — Other Ambulatory Visit: Payer: Self-pay | Admitting: Adult Health

## 2021-05-08 DIAGNOSIS — E538 Deficiency of other specified B group vitamins: Secondary | ICD-10-CM

## 2021-05-08 NOTE — Progress Notes (Signed)
Orders Placed This Encounter  Procedures   B12 and Folate Panel    Standing Status:   Future    Standing Expiration Date:   05/08/2022   Add on lab

## 2021-05-10 ENCOUNTER — Telehealth: Payer: Self-pay

## 2021-05-10 ENCOUNTER — Telehealth (INDEPENDENT_AMBULATORY_CARE_PROVIDER_SITE_OTHER): Payer: 59 | Admitting: Adult Health

## 2021-05-10 ENCOUNTER — Encounter: Payer: Self-pay | Admitting: Adult Health

## 2021-05-10 VITALS — Ht 67.76 in | Wt 153.0 lb

## 2021-05-10 DIAGNOSIS — U071 COVID-19: Secondary | ICD-10-CM | POA: Diagnosis not present

## 2021-05-10 DIAGNOSIS — J069 Acute upper respiratory infection, unspecified: Secondary | ICD-10-CM | POA: Insufficient documentation

## 2021-05-10 MED ORDER — MOLNUPIRAVIR EUA 200MG CAPSULE: 4.0000 | ORAL_CAPSULE | Freq: Two times a day (BID) | ORAL | 0 refills | Status: AC

## 2021-05-10 NOTE — Telephone Encounter (Signed)
Opened in error

## 2021-05-10 NOTE — Patient Instructions (Signed)
Advised in person evaluation at anytime is advised if any symptoms do not improve, worsen or change at any given time.  Red Flags discussed. The patient was given clear instructions to go to ER or return to medical center if any red flags develop, symptoms do not improve, worsen or new problems develop. They verbalized understanding.   Upper Respiratory Infection, Adult An upper respiratory infection (URI) affects the nose, throat, and upper airways that lead to the lungs. The most common type of URI is often called the common cold. URIs usually get better on their own, without medical treatment. What are the causes? A URI is caused by a germ (virus). You may catch these germs by: Breathing in droplets from an infected person's cough or sneeze. Touching something that has the germ on it (is contaminated) and then touching your mouth, nose, or eyes. What increases the risk? You are more likely to get a URI if: You are very young or very old. You have close contact with others, such as at work, school, or a health care facility. You smoke. You have long-term (chronic) heart or lung disease. You have a weakened disease-fighting system (immune system). You have nasal allergies or asthma. You have a lot of stress. You have poor nutrition. What are the signs or symptoms? Runny or stuffy (congested) nose. Cough. Sneezing. Sore throat. Headache. Feeling tired (fatigue). Fever. Not wanting to eat as much as usual. Pain in your forehead, behind your eyes, and over your cheekbones (sinus pain). Muscle aches. Redness or irritation of the eyes. Pressure in the ears or face. How is this treated? URIs usually get better on their own within 7-10 days. Medicines cannot cure URIs, but your doctor may recommend certain medicines to help relieve symptoms, such as: Over-the-counter cold medicines. Medicines to reduce coughing (cough suppressants). Coughing is a type of defense against infection that  helps to clear the nose, throat, windpipe, and lungs (respiratory system). Take these medicines only as told by your doctor. Medicines to lower your fever. Follow these instructions at home: Activity Rest as needed. If you have a fever, stay home from work or school until your fever is gone, or until your doctor says you may return to work or school. You should stay home until you cannot spread the infection anymore (you are not contagious). Your doctor may have you wear a face mask so you have less risk of spreading the infection. Relieving symptoms Rinse your mouth often with salt water. To make salt water, dissolve -1 tsp (3-6 g) of salt in 1 cup (237 mL) of warm water. Use a cool-mist humidifier to add moisture to the air. This can help you breathe more easily. Eating and drinking  Drink enough fluid to keep your pee (urine) pale yellow. Eat soups and other clear broths. General instructions  Take over-the-counter and prescription medicines only as told by your doctor. Do not smoke or use any products that contain nicotine or tobacco. If you need help quitting, ask your doctor. Avoid being where people are smoking (avoid secondhand smoke). Stay up to date on all your shots (immunizations), and get the flu shot every year. Keep all follow-up visits. How to prevent the spread of infection to others  Wash your hands with soap and water for at least 20 seconds. If you cannot use soap and water, use hand sanitizer. Avoid touching your mouth, face, eyes, or nose. Cough or sneeze into a tissue or your sleeve or elbow. Do not cough  or sneeze into your hand or into the air. Contact a doctor if: You are getting worse, not better. You have any of these: A fever or chills. Brown or red mucus in your nose. Yellow or brown fluid (discharge)coming from your nose. Pain in your face, especially when you bend forward. Swollen neck glands. Pain when you swallow. White areas in the back of your  throat. Get help right away if: You have shortness of breath that gets worse. You have very bad or constant: Headache. Ear pain. Pain in your forehead, behind your eyes, and over your cheekbones (sinus pain). Chest pain. You have long-lasting (chronic) lung disease along with any of these: Making high-pitched whistling sounds when you breathe, most often when you breathe out (wheezing). Long-lasting cough (more than 14 days). Coughing up blood. A change in your usual mucus. You have a stiff neck. You have changes in your: Vision. Hearing. Thinking. Mood. These symptoms may be an emergency. Get help right away. Call 911. Do not wait to see if the symptoms will go away. Do not drive yourself to the hospital. Summary An upper respiratory infection (URI) is caused by a germ (virus). The most common type of URI is often called the common cold. URIs usually get better within 7-10 days. Take over-the-counter and prescription medicines only as told by your doctor. This information is not intended to replace advice given to you by your health care provider. Make sure you discuss any questions you have with your health care provider. Document Revised: 12/13/2020 Document Reviewed: 12/13/2020 Elsevier Patient Education  [place] Oral Capsules What is this medication? MOLNUPIRAVIR (mol nue pir a vir) treats COVID-19. It is an antiviral medication. It may decrease the risk of developing severe symptoms of COVID-19. It may also decrease the chance of going to the hospital. This medication is not approved by the FDA. The FDA has authorized emergency use of this medication during the COVID-19 pandemic. This medicine may be used for other purposes; ask your health care provider or pharmacist if you have questions. COMMON BRAND NAME(S): LAGEVRIO What should I tell my care team before I take this medication? They need to know if you have any of these conditions: Any  allergies Any serious illness An unusual or allergic reaction to molnupiravir, other medications, foods, dyes, or preservatives Pregnant or trying to get pregnant Breast-feeding How should I use this medication? Take this medication by mouth with water. Take it as directed on the prescription label at the same time every day. Do not cut, crush or chew this medication. Swallow the capsules whole. You can take it with or without food. If it upsets your stomach, take it with food. Take all of this medication unless your care team tells you to stop it early. Keep taking it even if you think you are better. Talk to your care team about the use of this medication in children. Special care may be needed. Overdosage: If you think you have taken too much of this medicine contact a poison control center or emergency room at once. NOTE: This medicine is only for you. Do not share this medicine with others. What if I miss a dose? If you miss a dose, take it as soon as you can unless it is more than 10 hours late. If it is more than 10 hours late, skip the missed dose. Take the next dose at the normal time. Do not take extra or 2 doses at the same  time to make up for the missed dose. What may interact with this medication? Interactions have not been studied. This list may not describe all possible interactions. Give your health care provider a list of all the medicines, herbs, non-prescription drugs, or dietary supplements you use. Also tell them if you smoke, drink alcohol, or use illegal drugs. Some items may interact with your medicine. What should I watch for while using this medication? Your condition will be monitored carefully while you are receiving this medication. Visit your care team for regular checkups. Tell your care team if your symptoms do not start to get better or if they get worse. Do not become pregnant while taking this medication. You may need a pregnancy test before starting this  medication. Women must use a reliable form of birth control while taking this medication and for 4 days after stopping the medication. Women should inform their care team if they wish to become pregnant or think they might be pregnant. Men should not father a child while taking this medication and for 3 months after stopping it. There is potential for serious harm to an unborn child. Talk to your care team for more information. Do not breast-feed an infant while taking this medication and for 4 days after stopping the medication. What side effects may I notice from receiving this medication? Side effects that you should report to your care team as soon as possible: Allergic reactions--skin rash, itching, hives, swelling of the face, lips, tongue, or throat Side effects that usually do not require medical attention (report these to your care team if they continue or are bothersome): Diarrhea Dizziness Nausea This list may not describe all possible side effects. Call your doctor for medical advice about side effects. You may report side effects to FDA at 1-800-FDA-1088. Where should I keep my medication? Keep out of the reach of children and pets. Store at room temperature between 20 and 25 degrees C (68 and 77 degrees F). Get rid of any unused medication after the expiration date. To get rid of medications that are no longer needed or have expired: Take the medication to a medication take-back program. Check with your pharmacy or law enforcement to find a location. If you cannot return the medication, check the label or package insert to see if the medication should be thrown out in the garbage or flushed down the toilet. If you are not sure, ask your care team. If it is safe to put it in the trash, take the medication out of the container. Mix the medication with cat litter, dirt, coffee grounds, or other unwanted substance. Seal the mixture in a bag or container. Put it in the trash. NOTE: This sheet  is a summary. It may not cover all possible information. If you have questions about this medicine, talk to your doctor, pharmacist, or health care provider.  2022 Elsevier/Gold Standard (2020-05-22 00:00:00) 10 Things You Can Do to Manage Your COVID-19 Symptoms at Home If you have possible or confirmed COVID-19 Stay home except to get medical care. Monitor your symptoms carefully. If your symptoms get worse, call your healthcare provider immediately. Get rest and stay hydrated. If you have a medical appointment, call the healthcare provider ahead of time and tell them that you have or may have COVID-19. For medical emergencies, call 911 and notify the dispatch personnel that you have or may have COVID-19. Cover your cough and sneezes with a tissue or use the inside of your elbow. Wash your  hands often with soap and water for at least 20 seconds or clean your hands with an alcohol-based hand sanitizer that contains at least 60% alcohol. As much as possible, stay in a specific room and away from other people in your home. Also, you should use a separate bathroom, if available. If you need to be around other people in or outside of the home, wear a mask. Avoid sharing personal items with other people in your household, like dishes, towels, and bedding. Clean all surfaces that are touched often, like counters, tabletops, and doorknobs. Use household cleaning sprays or wipes according to the label instructions. [email or web address] 12/10/2019 This information is not intended to replace advice given to you by your health care provider. Make sure you discuss any questions you have with your health care provider. Document Revised: 02/02/2021 Document Reviewed: 02/02/2021 Elsevier Patient Education  [place].

## 2021-05-10 NOTE — Telephone Encounter (Signed)
LMTCB about covid testing.

## 2021-05-10 NOTE — Progress Notes (Signed)
Virtual Visit via Telephone Note  I connected with Lisa Gonzalez on 05/10/21 at  3:00 PM EST by telephone and verified that I am speaking with the correct person using two identifiers.  Location: Patient: at home  Provider: Provider: Provider's office at  [facility], [place].      I discussed the limitations, risks, security and privacy concerns of performing an evaluation and management service by telephone and the availability of in person appointments. I also discussed with the patient that there may be a patient responsible charge related to this service. The patient expressed understanding and agreed to proceed.   History of Present Illness: Patient tested positive for covid today 05/10/21 and symptoms started 12/13/ 22 sinus drainage, sore throat, post nasal drainage, fatigue.  Mild symptoms she feels.  Denies any chest congestion.  Afebrile.   Patient  denies any fever, body aches,chills, rash, chest pain, shortness of breath, nausea, vomiting, or diarrhea.     Observations/Objective:   Patient is alert and oriented and responsive to questions Engages in conversation with provider. Speaks in full sentences without any pauses without any shortness of breath or distress.     Assessment and Plan:  COVID-19  Viral upper respiratory tract infection  Meds ordered this encounter  Medications   molnupiravir EUA (LAGEVRIO) 200 mg CAPS capsule    Sig: Take 4 capsules (800 mg total) by mouth 2 (two) times daily for 5 days.    Dispense:  40 capsule    Refill:  0    Follow Up Instructions:  Red Flags discussed. The patient was given clear instructions to go to ER or return to medical center if any red flags develop, symptoms do not improve, worsen or new problems develop. They verbalized understanding.  I discussed the assessment and treatment plan with the patient. The patient was provided an opportunity to ask questions and all were  answered. The patient agreed with the plan and demonstrated an understanding of the instructions.   The patient was advised to call back or seek an in-person evaluation if the symptoms worsen or if the condition fails to improve as anticipated.  I provided 20 minutes of non-face-to-face time during this encounter.   Marcille Buffy, FNP

## 2021-05-11 ENCOUNTER — Ambulatory Visit: Payer: 59 | Admitting: Podiatry

## 2021-05-14 ENCOUNTER — Other Ambulatory Visit: Payer: Self-pay

## 2021-05-14 ENCOUNTER — Ambulatory Visit: Payer: 59 | Admitting: Physical Therapy

## 2021-05-14 DIAGNOSIS — M6208 Separation of muscle (nontraumatic), other site: Secondary | ICD-10-CM

## 2021-05-14 DIAGNOSIS — R102 Pelvic and perineal pain: Secondary | ICD-10-CM

## 2021-05-14 DIAGNOSIS — M25551 Pain in right hip: Secondary | ICD-10-CM

## 2021-05-14 DIAGNOSIS — M79672 Pain in left foot: Secondary | ICD-10-CM

## 2021-05-14 DIAGNOSIS — R2689 Other abnormalities of gait and mobility: Secondary | ICD-10-CM
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
## 2021-05-14 NOTE — Patient Instructions (Addendum)
Position in bed on back:  with semi reclined position( pillow lengthwise to midback not under neck )  Small towel roll under neck  Pillow under knees, rolled towel or small throw pillow under calves to lift heel off bed to prevent pressure sores   __ Position in car:   Car seat modification  Folded towels to fill bucket seat Folded beach towel long ways against back of seat into head rest to fill in the space so your head and back and sacrum aligns  Hands at 9 and 3 o clock  Feet and heel on the mat  Back of the hips against the seat evenly    ___  Standing with navel over ballmounds,balancing between heels and ballmounds   Walking with more navel forward    ,less on heels    ___  Please schedule your next patient after leaving PT with 1 hour to include commute time, lunch, rest and allowing your body to build memory and assimulate all your body has learned so you can retain the relaxation and alignment and not rush right back to sitting with a patient  Reclaim time after PT session for slowness and calm before your next patient for yourself with transition time. This will help with further progress as new pattern has theopportunity to sink in,

## 2021-05-15 ENCOUNTER — Ambulatory Visit: Payer: 59

## 2021-05-15 ENCOUNTER — Ambulatory Visit (INDEPENDENT_AMBULATORY_CARE_PROVIDER_SITE_OTHER): Payer: 59 | Admitting: Podiatry

## 2021-05-15 DIAGNOSIS — G5762 Lesion of plantar nerve, left lower limb: Secondary | ICD-10-CM | POA: Diagnosis not present

## 2021-05-15 MED ORDER — BETAMETHASONE SOD PHOS & ACET 6 (3-3) MG/ML IJ SUSP: 3.0000 mg | Freq: Once | INTRAMUSCULAR | Status: AC

## 2021-05-15 MED ORDER — METHYLPREDNISOLONE 4 MG PO TBPK: ORAL_TABLET | ORAL | 0 refills | Status: DC

## 2021-05-15 NOTE — Progress Notes (Signed)
HPI: 60 y.o. female presenting today as a new patient for evaluation of pain and tenderness to the left forefoot this been going on ever since the summer.  Patient states that the ball of her foot is very painful.  She has been wearing insoles from the [employer] store which have helped with some of the pain.  Other than the orthotic she has not anything for treatment.  She is also dealing with some right lower extremity sciatica.  She presents for further treatment and evaluation  Past Medical History:  Diagnosis Date   Allergy    Anxiety    Back pain    Cyst of left breast    GERD (gastroesophageal reflux disease)    History of endometriosis    History of ovarian cyst    PTSD (post-traumatic stress disorder)     Past Surgical History:  Procedure Laterality Date   COLONOSCOPY WITH PROPOFOL N/A 03/21/2021   Procedure: COLONOSCOPY WITH PROPOFOL;  Surgeon: Virgel Manifold, MD;  Location: [facility] ENDOSCOPY;  Service: Endoscopy;  Laterality: N/A;   LAPAROSCOPIC VAGINAL HYSTERECTOMY     SPINAL FUSION  2018   SPINE SURGERY      Allergies  Allergen Reactions   Meperidine Swelling and Nausea Only    Blood Pressure Dropped Low BP and pass out.    Hydrocodone Nausea And Vomiting, Swelling and Nausea Only    Hot and cold chills, vomiting, headache, and chest pain  Other reaction(s): Vomiting Hot and cold chills, headache, chest pain   Hydromorphone Nausea And Vomiting and Nausea Only    Other reaction(s): Vomiting   Opium Other (See Comments)    Hot and cold chills, vomiting, headache and chest pain   Oxycodone Nausea And Vomiting and Nausea Only    Other reaction(s): Other (comments) Hot and cold chills, headache, vomiting, and chest pain.      Physical Exam: General: The patient is alert and oriented x3 in no acute distress.  Dermatology: Skin is warm, dry and supple bilateral lower extremities. Negative for open lesions or macerations.  Vascular: Palpable pedal pulses  bilaterally. Capillary refill within normal limits.  Negative for any significant edema or erythema  Neurological: Light touch and protective threshold grossly intact  Musculoskeletal Exam: Pain on palpation noted to the second interspace of the left foot with pain with lateral compression of the metatarsal heads.  There is also a splay toe deformity noted on weightbearing and loading of the foot  Radiographic Exam:  Normal osseous mineralization. Joint spaces preserved. No fracture/dislocation/boney destruction.  There is some splay toe deformity between the second and third digits visible on x-ray consistent with the clinical findings of a Morton's neuroma  Assessment: 1.  Morton's neuroma left second interspace   Plan of Care:  1. Patient evaluated. X-Rays reviewed.  2.  Injection of 0.5 cc Celestone Soluspan injected in sec interspace left foot 3.  Prescription for Medrol Dosepak 4.  Patient cannot take NSAIDs due to GI complications 5.  Continue wearing arch supports from the [employer] store.  Advised against going barefoot 6.  Return to clinic 4 weeks      Edrick Kins, DPM [facility]  Dr. Edrick Kins, DPM    [address]  [place], [place] [address]                Office [phone]  Fax [phone]

## 2021-05-16 ENCOUNTER — Ambulatory Visit
Admission: RE | Admit: 2021-05-16 | Discharge: 2021-05-16 | Disposition: A | Discharge status: Home / Self Care | Payer: 59 | Source: Ambulatory Visit | Attending: Adult Health | Admitting: Adult Health

## 2021-05-16 ENCOUNTER — Other Ambulatory Visit: Payer: Self-pay

## 2021-05-16 DIAGNOSIS — I739 Peripheral vascular disease, unspecified: Secondary | ICD-10-CM | POA: Diagnosis not present

## 2021-05-17 ENCOUNTER — Telehealth: Payer: Self-pay

## 2021-05-17 NOTE — Progress Notes (Signed)
Ultrasound of arterial lower extremities is within normal limits.

## 2021-05-17 NOTE — Telephone Encounter (Signed)
LMTCB for lab results.  

## 2021-05-17 NOTE — Telephone Encounter (Signed)
Pt returning call. Pt stated that if you return the call and she doesn't answer, She is with Pt herself. Pt requesting callback

## 2021-05-18 ENCOUNTER — Other Ambulatory Visit: Payer: Self-pay

## 2021-05-18 ENCOUNTER — Other Ambulatory Visit (INDEPENDENT_AMBULATORY_CARE_PROVIDER_SITE_OTHER): Payer: 59

## 2021-05-18 DIAGNOSIS — E538 Deficiency of other specified B group vitamins: Secondary | ICD-10-CM

## 2021-05-18 DIAGNOSIS — G8929 Other chronic pain: Secondary | ICD-10-CM

## 2021-05-18 DIAGNOSIS — M25552 Pain in left hip: Secondary | ICD-10-CM | POA: Diagnosis not present

## 2021-05-18 DIAGNOSIS — I739 Peripheral vascular disease, unspecified: Secondary | ICD-10-CM | POA: Diagnosis not present

## 2021-05-18 LAB — COMPREHENSIVE METABOLIC PANEL
ALT: 19 U/L (ref 0–35)
AST: 17 U/L (ref 0–37)
Albumin: 4.5 g/dL (ref 3.5–5.2)
Alkaline Phosphatase: 58 U/L (ref 39–117)
BUN: 22 mg/dL (ref 6–23)
CO2: 33 mEq/L — ABNORMAL HIGH (ref 19–32)
Calcium: 9.7 mg/dL (ref 8.4–10.5)
Chloride: 99 mEq/L (ref 96–112)
Creatinine, Ser: 0.96 mg/dL (ref 0.40–1.20)
GFR: 64.28 mL/min (ref >60.00)
Glucose, Bld: 96 mg/dL (ref 70–99)
Potassium: 4.1 mEq/L (ref 3.5–5.1)
Sodium: 139 mEq/L (ref 135–145)
Total Bilirubin: 0.7 mg/dL (ref 0.2–1.2)
Total Protein: 7.3 g/dL (ref 6.0–8.3)

## 2021-05-18 LAB — TSH: TSH: 2.08 u[IU]/mL (ref 0.35–5.50)

## 2021-05-18 LAB — CBC WITH DIFFERENTIAL/PLATELET
Basophils Absolute: 0 10*3/uL (ref 0.0–0.1)
Basophils Relative: 0.2 % (ref 0.0–3.0)
Eosinophils Absolute: 0 10*3/uL (ref 0.0–0.7)
Eosinophils Relative: 0.1 % (ref 0.0–5.0)
HCT: 43.1 % (ref 36.0–46.0)
Hemoglobin: 14.2 g/dL (ref 12.0–15.0)
Lymphocytes Relative: 18.5 % (ref 12.0–46.0)
Lymphs Abs: 1.7 10*3/uL (ref 0.7–4.0)
MCHC: 33 g/dL (ref 30.0–36.0)
MCV: 91.2 fl (ref 78.0–100.0)
Monocytes Absolute: 0.6 10*3/uL (ref 0.1–1.0)
Monocytes Relative: 6.8 % (ref 3.0–12.0)
Neutro Abs: 7 10*3/uL (ref 1.4–7.7)
Neutrophils Relative %: 74.4 % (ref 43.0–77.0)
Platelets: 308 10*3/uL (ref 150.0–400.0)
RBC: 4.72 Mil/uL (ref 3.87–5.11)
RDW: 13.3 % (ref 11.5–15.5)
WBC: 9.4 10*3/uL (ref 4.0–10.5)

## 2021-05-18 LAB — LIPID PANEL
Cholesterol: 257 mg/dL — ABNORMAL HIGH (ref 0–200)
HDL: 49.1 mg/dL (ref >39.00)
LDL Cholesterol: 178 mg/dL — ABNORMAL HIGH (ref 0–99)
NonHDL: 208.14
Total CHOL/HDL Ratio: 5
Triglycerides: 151 mg/dL — ABNORMAL HIGH (ref 0.0–149.0)
VLDL: 30.2 mg/dL (ref 0.0–40.0)

## 2021-05-18 LAB — B12 AND FOLATE PANEL
Folate: >24.2 ng/mL (ref >5.9)
Vitamin B-12: 881 pg/mL (ref 211–911)

## 2021-05-18 LAB — VITAMIN D 25 HYDROXY (VIT D DEFICIENCY, FRACTURES): VITD: 62.98 ng/mL (ref 30.00–100.00)

## 2021-05-18 NOTE — Progress Notes (Signed)
CBC within normal limits, vitamin D level within normal limits, TSH for thyroid within normal limits.  B12 and folate within normal limits. Triglycerides elevated.Total cholesterol and LDL elevated.  Discuss lifestyle modification with patient e.g. increase exercise, fiber, fruits, vegetables, lean meat, and omega 3/fish intake and decrease saturated fat.  If patient following strict diet and exercise program already please schedule follow up appointment with primary care physician.  If patient was fasting for these labs would recommend a low-dose statin cholesterol-lowering medication to help reduce the risk of cardiovascular events in the future. CMP within normal limits.

## 2021-05-23 ENCOUNTER — Other Ambulatory Visit: Payer: Self-pay

## 2021-05-23 ENCOUNTER — Ambulatory Visit
Admission: RE | Admit: 2021-05-23 | Discharge: 2021-05-23 | Disposition: A | Discharge status: Home / Self Care | Payer: 59 | Source: Ambulatory Visit | Attending: Otolaryngology | Admitting: Otolaryngology

## 2021-05-23 ENCOUNTER — Ambulatory Visit: Payer: 59 | Admitting: Physical Therapy

## 2021-05-23 DIAGNOSIS — R102 Pelvic and perineal pain: Secondary | ICD-10-CM

## 2021-05-23 DIAGNOSIS — M6208 Separation of muscle (nontraumatic), other site: Secondary | ICD-10-CM

## 2021-05-23 DIAGNOSIS — R202 Paresthesia of skin: Secondary | ICD-10-CM

## 2021-05-23 DIAGNOSIS — M25551 Pain in right hip: Secondary | ICD-10-CM

## 2021-05-23 DIAGNOSIS — R2689 Other abnormalities of gait and mobility: Secondary | ICD-10-CM

## 2021-05-23 DIAGNOSIS — M79672 Pain in left foot: Secondary | ICD-10-CM

## 2021-05-23 IMAGING — MR MR FACE/TRIGEMINAL WO/W CM
5 of 8 series · 22 of 48 positions shown · IV contrast (gadavist)
Comparison: No pertinent prior exam.

CLINICAL DATA: Paresthesia of the nose over the last 2 years which
is worsening.

EXAM:
MRI FACE TRIGEMINAL WITHOUT AND WITH CONTRAST
TECHNIQUE: Multiplanar, multi-echo pulse sequences of the face and surrounding
structures, including thin-slice imaging of the trigeminal nerves,
were acquired before and after intravenous contrast administration.
CONTRAST:  6mL GADAVIST GADOBUTROL 1 MMOL/ML IV SOLN

[Series 5: T1 · sagittal · 3.0mm · 0.35mm/px · 4 of 39 slices shown (1 of 3)]
[im 1/39]
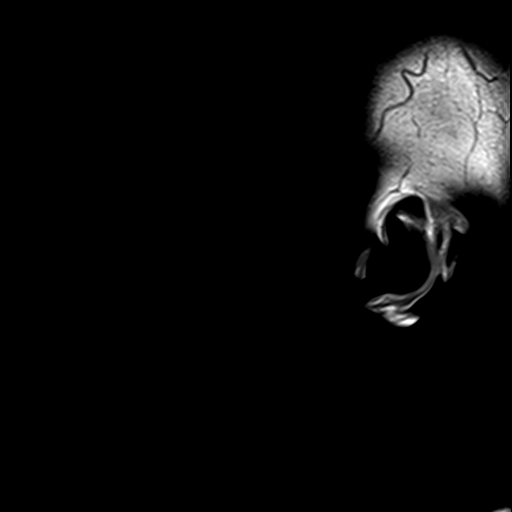
[im 13/39]
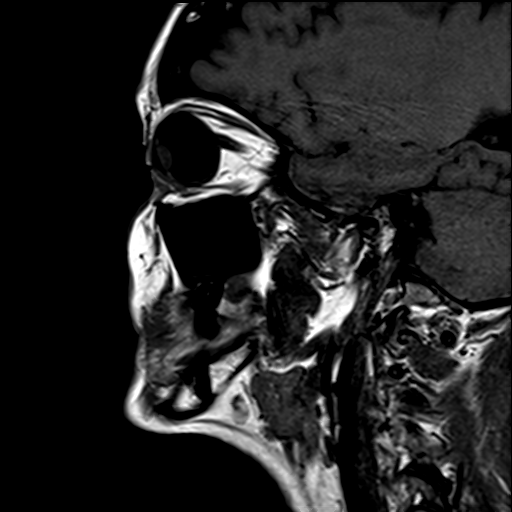
[im 26/39]
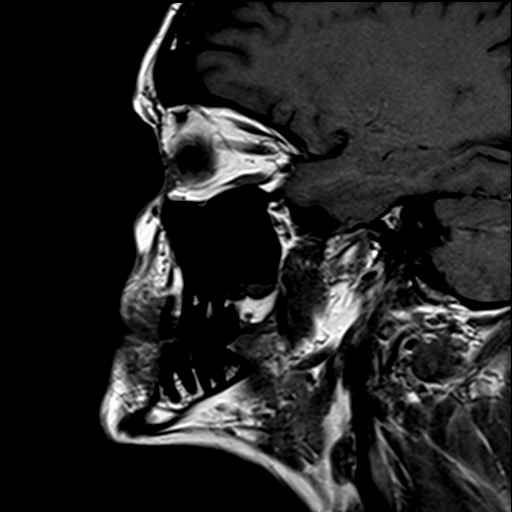
[im 39/39]
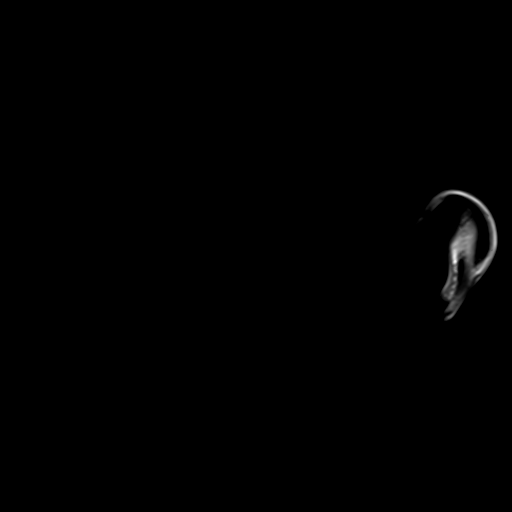

[Series 6: T2 · coronal · 3.0mm · 0.70mm/px · 5 of 43 slices shown]
[im 1/43]
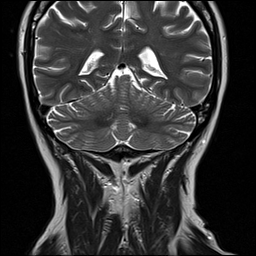
[im 11/43]
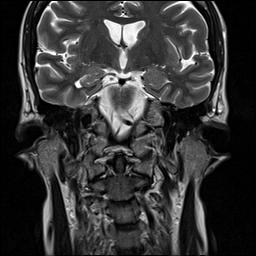
[im 22/43]
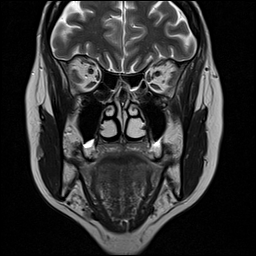
[im 32/43]
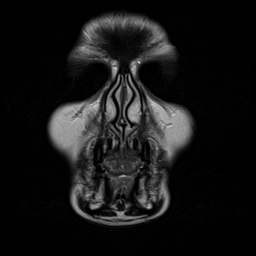
[im 43/43]
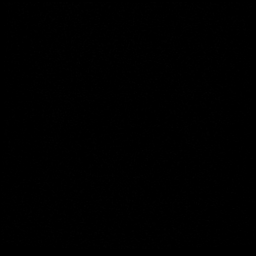

[Series 8: T1 · axial · 3.0mm · 0.35mm/px · z∈[-131,+4]mm · 5 of 35 slices shown (2 of 3)]
[im 1/35]
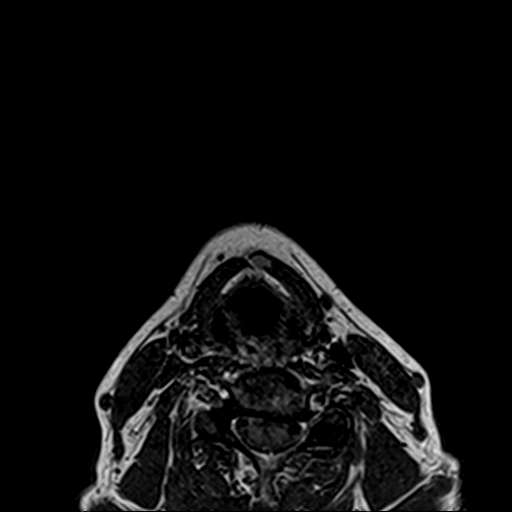
[im 9/35]
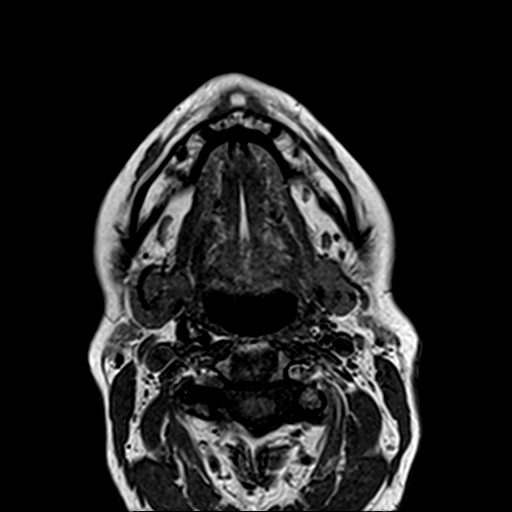
[im 18/35]
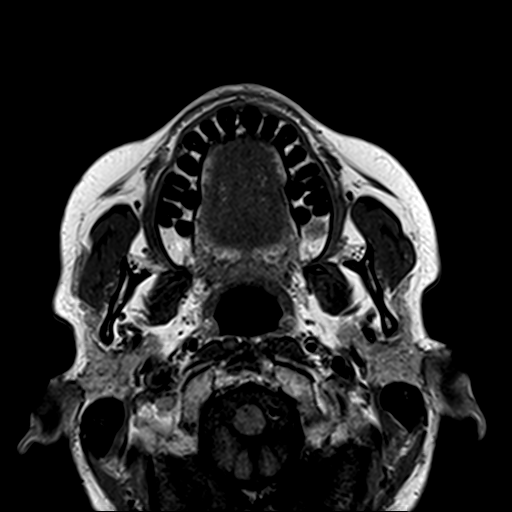
[im 26/35]
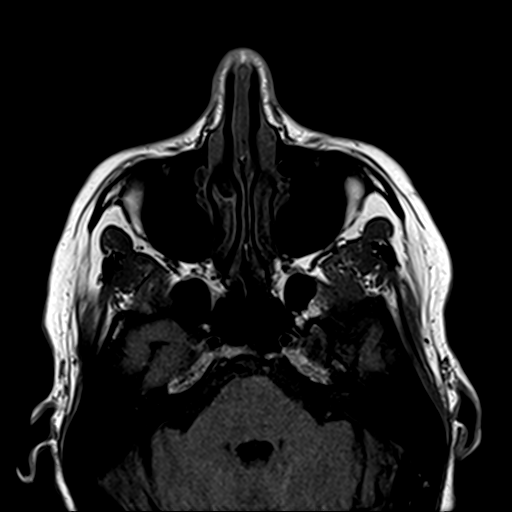
[im 35/35]
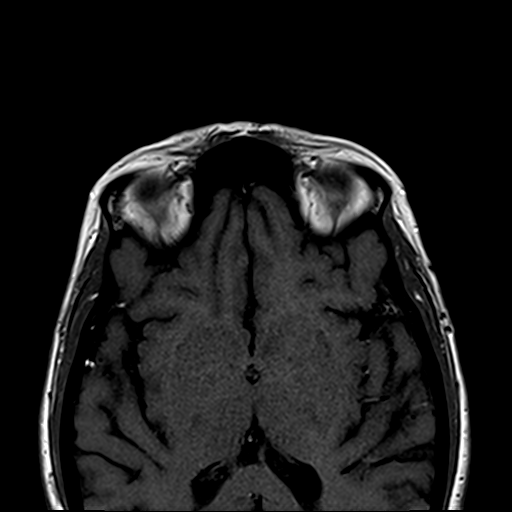

[Series 9: T1 · coronal · 3.0mm · 0.35mm/px · 6 of 43 slices shown (3 of 3)]
[im 1/43]
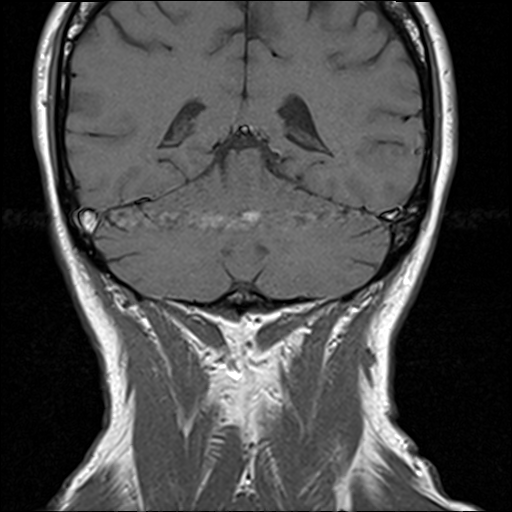
[im 9/43]
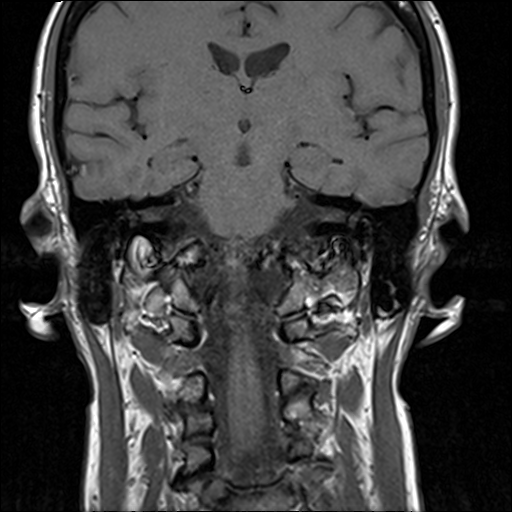
[im 17/43]
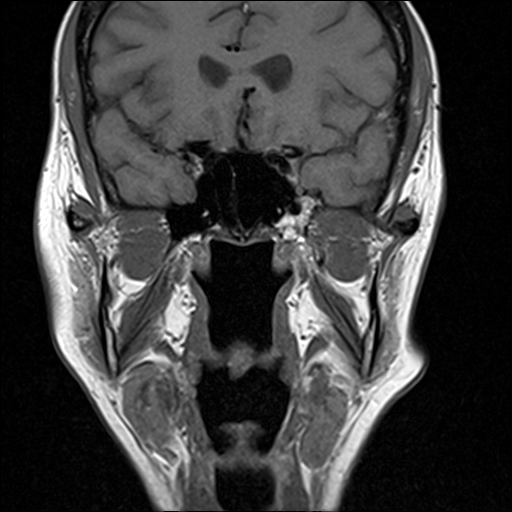
[im 26/43]
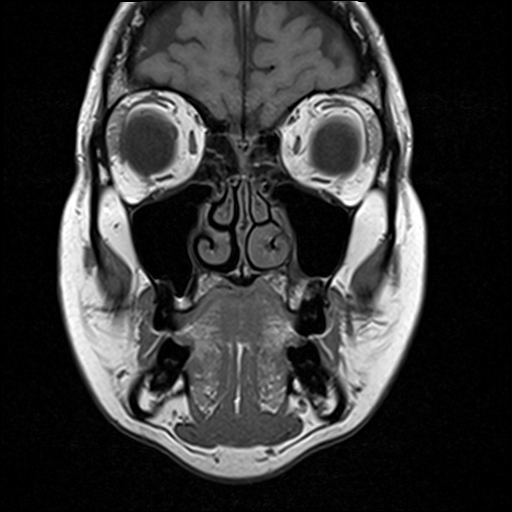
[im 34/43]
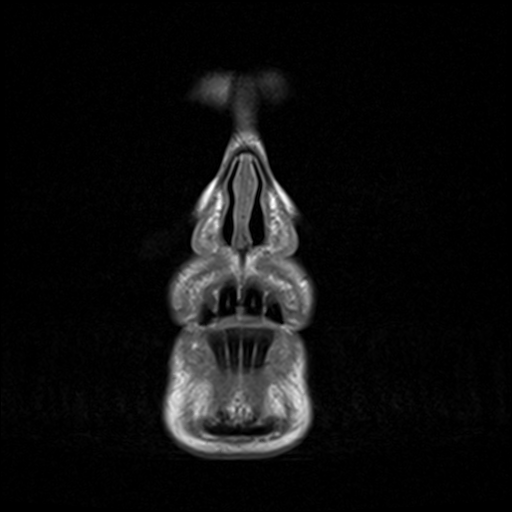
[im 43/43]
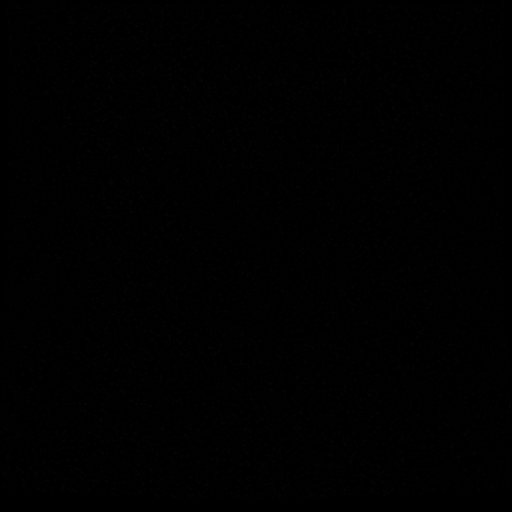

[Series 11: T1 fat-sat post-contrast · axial · 3.0mm · 0.35mm/px · z∈[-131,-99]mm · 2 of 35 slices shown]
[im 1/35]
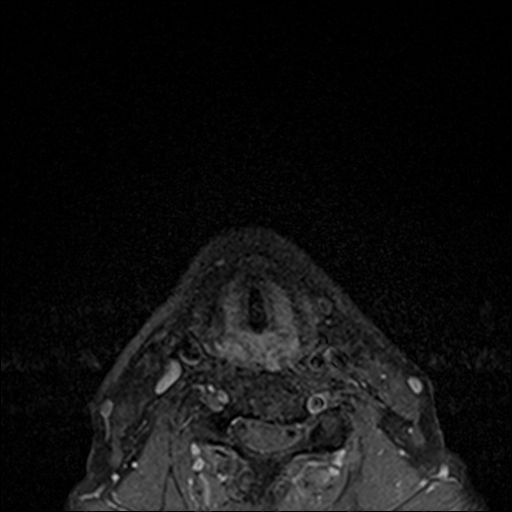
[im 9/35]
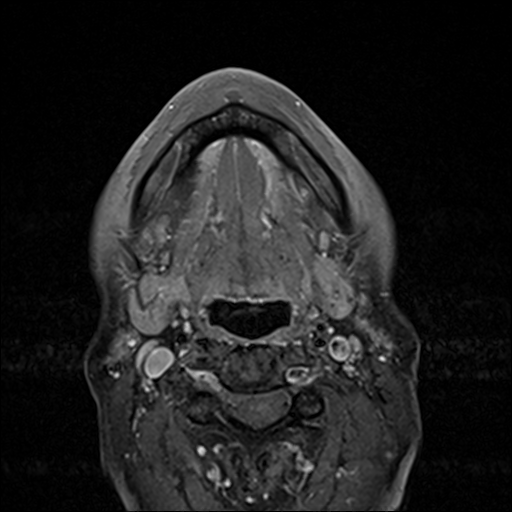

[22 of 48 positions shown; findings below may reference images not displayed]

FINDINGS: Limited intracranial/Trigeminal nerves: The brain as seen appears
normal except for a few small foci of T2 signal within the white
matter. This is nonspecific and was not studied in detail, but
usually indicates an early manifestation of small vessel change.
Demyelinating disease is not excluded.

Fifth nerves appear normal. No evidence of schwannoma or abnormal
enhancement. No evidence of vascular encroachment upon either root
entry zone. A small vessel passes near each root entry zone but
these appear to be veins. Both Meckel's caves appear normal. No
evidence of infratemporal fossa lesion.

Vascular: Major vessels at the base of the brain show flow.

Sinuses/Orbits: Clear/normal

Soft tissues: The nose itself does not show any abnormality by MRI.

Osseous: No abnormal osseous finding.

Other: None
IMPRESSION: No abnormality seen to explain numbness of the nose. See above for
full discussion.

## 2021-05-23 MED ORDER — GADOBUTROL 1 MMOL/ML IV SOLN
6.0000 mL | Freq: Once | INTRAVENOUS | Status: AC | PRN
  Administered 2021-05-23: 16:00:00 6 mL via INTRAVENOUS
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
## 2021-05-23 NOTE — Patient Instructions (Signed)
Manicure spreader in toes   ___  Heel raises with navel over ballmound line, hand on wall  15 reps  All ballmounds down ,  __  Letter T, seeasaw on one leg with band band under L foot, wrap by big toe then, outer knee/ thigh, L hand pulling ( elbow by side)  Plant ballmound, toes spread, thigh out against band,, tracking knee in line with 2-3rd toe line Dipping forward, R foot lifts slight ( whole body like a see saw) off floor or  Press back foot against wall 20 reps  Both

## 2021-05-30 ENCOUNTER — Other Ambulatory Visit: Payer: Self-pay

## 2021-05-30 ENCOUNTER — Encounter: Payer: Self-pay | Admitting: Adult Health

## 2021-05-30 ENCOUNTER — Ambulatory Visit: Payer: Commercial Managed Care - HMO | Attending: Orthopedic Surgery | Admitting: Physical Therapy

## 2021-05-30 ENCOUNTER — Other Ambulatory Visit: Payer: Self-pay | Admitting: Adult Health

## 2021-05-30 ENCOUNTER — Ambulatory Visit (INDEPENDENT_AMBULATORY_CARE_PROVIDER_SITE_OTHER): Payer: PRIVATE HEALTH INSURANCE | Admitting: Adult Health

## 2021-05-30 VITALS — BP 114/62 | HR 95 | Ht 67.76 in | Wt 158.6 lb

## 2021-05-30 DIAGNOSIS — Z8616 Personal history of COVID-19: Secondary | ICD-10-CM

## 2021-05-30 DIAGNOSIS — R051 Acute cough: Secondary | ICD-10-CM

## 2021-05-30 DIAGNOSIS — M6208 Separation of muscle (nontraumatic), other site: Secondary | ICD-10-CM | POA: Diagnosis present

## 2021-05-30 DIAGNOSIS — M25551 Pain in right hip: Secondary | ICD-10-CM | POA: Diagnosis present

## 2021-05-30 DIAGNOSIS — R102 Pelvic and perineal pain: Secondary | ICD-10-CM | POA: Diagnosis present

## 2021-05-30 DIAGNOSIS — M79672 Pain in left foot: Secondary | ICD-10-CM | POA: Diagnosis present

## 2021-05-30 DIAGNOSIS — R2689 Other abnormalities of gait and mobility: Secondary | ICD-10-CM | POA: Diagnosis present

## 2021-05-30 DIAGNOSIS — E785 Hyperlipidemia, unspecified: Secondary | ICD-10-CM

## 2021-05-30 DIAGNOSIS — Z8669 Personal history of other diseases of the nervous system and sense organs: Secondary | ICD-10-CM

## 2021-05-30 MED ORDER — ROSUVASTATIN CALCIUM 5 MG PO TABS: 5.0000 mg | ORAL_TABLET | Freq: Every day | ORAL | 3 refills | Status: DC

## 2021-05-30 NOTE — Patient Instructions (Signed)
Preventing High Cholesterol Cholesterol is a white, waxy substance similar to fat that the human body needs to help build cells. The liver makes all the cholesterol that a person's body needs. Having high cholesterol (hypercholesterolemia) increases your risk for heart disease and stroke. Extra or excess cholesterol comes from the food that you eat. High cholesterol can often be prevented with diet and lifestyle changes. If you already have high cholesterol, you can control it with diet, lifestyle changes, and medicines. How can high cholesterol affect me? If you have high cholesterol, fatty deposits (plaques) may build up on the walls of your blood vessels. The blood vessels that carry blood away from your heart are called arteries. Plaques make the arteries narrower and stiffer. This in turn can: Restrict or block blood flow and cause blood clots to form. Increase your risk for heart attack and stroke. What can increase my risk for high cholesterol? This condition is more likely to develop in people who: Eat foods that are high in saturated fat or cholesterol. Saturated fat is mostly found in foods that come from animal sources. Are overweight. Are not getting enough exercise. Use products that contain nicotine or tobacco, such as cigarettes, e-cigarettes, and chewing tobacco. Have a family history of high cholesterol (familial hypercholesterolemia). What actions can I take to prevent this? Nutrition  Eat less saturated fat. Avoid trans fats (partially hydrogenated oils). These are often found in margarine and in some baked goods, fried foods, and snacks bought in packages. Avoid precooked or cured meat, such as bacon, sausages, or meat loaves. Avoid foods and drinks that have added sugars. Eat more fruits, vegetables, and whole grains. Choose healthy sources of protein, such as fish, poultry, lean cuts of red meat, beans, peas, lentils, and nuts. Choose healthy sources of fat, such  as: Nuts. Vegetable oils, especially olive oil. Fish that have healthy fats, such as omega-3 fatty acids. These fish include mackerel or salmon. Lifestyle Lose weight if you are overweight. Maintaining a healthy body mass index (BMI) can help prevent or control high cholesterol. It can also lower your risk for diabetes and high blood pressure. Ask your health care provider to help you with a diet and exercise plan to lose weight safely. Do not use any products that contain nicotine or tobacco. These products include cigarettes, chewing tobacco, and vaping devices, such as e-cigarettes. If you need help quitting, ask your health care provider. Alcohol use Do not drink alcohol if: Your health care provider tells you not to drink. You are pregnant, may be pregnant, or are planning to become pregnant. If you drink alcohol: Limit how much you have to: 0-1 drink a day for women. 0-2 drinks a day for men. Know how much alcohol is in your drink. In the [place] one drink equals one 12 oz bottle of beer (355 mL), one 5 oz glass of wine (148 mL), or one 1 oz glass of hard liquor (44 mL). Activity  Get enough exercise. Do exercises as told by your health care provider. Each week, do at least 150 minutes of exercise that takes a medium level of effort (moderate-intensity exercise). This kind of exercise: Makes your heart beat faster while allowing you to still be able to talk. Can be done in short sessions several times a day or longer sessions a few times a week. For example, on 5 days each week, you could walk fast or ride your bike 3 times a day for 10 minutes each time. Medicines Your  health care provider may recommend medicines to help lower cholesterol. This may be a medicine to lower the amount of cholesterol that your liver makes. You may need medicine if: Diet and lifestyle changes have not lowered your cholesterol enough. You have high cholesterol and other risk factors for heart disease or  stroke. Take over-the-counter and prescription medicines only as told by your health care provider. General information Manage your risk factors for high cholesterol. Talk with your health care provider about all your risk factors and how to lower your risk. Manage other conditions that you have, such as diabetes or high blood pressure (hypertension). Have blood tests to check your cholesterol levels at regular points in time as told by your health care provider. Keep all follow-up visits. This is important. Where to find more information American Heart Association: www.heart.org National Heart, Lung, and Blood Institute: https://wilson-eaton.com/ Summary High cholesterol increases your risk for heart disease and stroke. By keeping your cholesterol level low, you can reduce your risk for these conditions. High cholesterol can often be prevented with diet and lifestyle changes. Work with your health care provider to manage your risk factors, and have your blood tested regularly. This information is not intended to replace advice given to you by your health care provider. Make sure you discuss any questions you have with your health care provider. Document Revised: 07/17/2020 Document Reviewed: 07/17/2020 Elsevier Patient Education  2022 [place]. High Cholesterol High cholesterol is a condition in which the blood has high levels of a white, waxy substance similar to fat (cholesterol). The liver makes all the cholesterol that the body needs. The human body needs small amounts of cholesterol to help build cells. A person gets extra or excess cholesterol from the food that he or she eats. The blood carries cholesterol from the liver to the rest of the body. If you have high cholesterol, deposits (plaques) may build up on the walls of your arteries. Arteries are the blood vessels that carry blood away from your heart. These plaques make the arteries narrow and stiff. Cholesterol plaques increase your  risk for heart attack and stroke. Work with your health care provider to keep your cholesterol levels in a healthy range. What increases the risk? The following factors may make you more likely to develop this condition: Eating foods that are high in animal fat (saturated fat) or cholesterol. Being overweight. Not getting enough exercise. A family history of high cholesterol (familial hypercholesterolemia). Use of tobacco products. Having diabetes. What are the signs or symptoms? In most cases, high cholesterol does not usually cause any symptoms. In severe cases, very high cholesterol levels can cause: Fatty bumps under the skin (xanthomas). A white or gray ring around the black center (pupil) of the eye. How is this diagnosed? This condition may be diagnosed based on the results of a blood test. If you are older than 61 years of age, your health care provider may check your cholesterol levels every 4-6 years. You may be checked more often if you have high cholesterol or other risk factors for heart disease. The blood test for cholesterol measures: "Bad" cholesterol, or LDL cholesterol. This is the main type of cholesterol that causes heart disease. The desired level is less than 100 mg/dL (2.59 mmol/L). "Good" cholesterol, or HDL cholesterol. HDL helps protect against heart disease by cleaning the arteries and carrying the LDL to the liver for processing. The desired level for HDL is 60 mg/dL (1.55 mmol/L) or higher. Triglycerides. These are  fats that your body can store or burn for energy. The desired level is less than 150 mg/dL (1.69 mmol/L). Total cholesterol. This measures the total amount of cholesterol in your blood and includes LDL, HDL, and triglycerides. The desired level is less than 200 mg/dL (5.17 mmol/L). How is this treated? Treatment for high cholesterol starts with lifestyle changes, such as diet and exercise. Diet changes. You may be asked to eat foods that have more  fiber and less saturated fats or added sugar. Lifestyle changes. These may include regular exercise, maintaining a healthy weight, and quitting use of tobacco products. Medicines. These are given when diet and lifestyle changes have not worked. You may be prescribed a statin medicine to help lower your cholesterol levels. Follow these instructions at home: Eating and drinking  Eat a healthy, balanced diet. This diet includes: Daily servings of a variety of fresh, frozen, or canned fruits and vegetables. Daily servings of whole grain foods that are rich in fiber. Foods that are low in saturated fats and trans fats. These include poultry and fish without skin, lean cuts of meat, and low-fat dairy products. A variety of fish, especially oily fish that contain omega-3 fatty acids. Aim to eat fish at least 2 times a week. Avoid foods and drinks that have added sugar. Use healthy cooking methods, such as roasting, grilling, broiling, baking, poaching, steaming, and stir-frying. Do not fry your food except for stir-frying. If you drink alcohol: Limit how much you have to: 0-1 drink a day for women who are not pregnant. 0-2 drinks a day for men. Know how much alcohol is in a drink. In the [place] one drink equals one 12 oz bottle of beer (355 mL), one 5 oz glass of wine (148 mL), or one 1 oz glass of hard liquor (44 mL). Lifestyle  Get regular exercise. Aim to exercise for a total of 150 minutes a week. Increase your activity level by doing activities such as gardening, walking, and taking the stairs. Do not use any products that contain nicotine or tobacco. These products include cigarettes, chewing tobacco, and vaping devices, such as e-cigarettes. If you need help quitting, ask your health care provider. General instructions Take over-the-counter and prescription medicines only as told by your health care provider. Keep all follow-up visits. This is important. Where to find more  information American Heart Association: www.heart.org National Heart, Lung, and Blood Institute: https://wilson-eaton.com/ Contact a health care provider if: You have trouble achieving or maintaining a healthy diet or weight. You are starting an exercise program. You are unable to stop smoking. Get help right away if: You have chest pain. You have trouble breathing. You have discomfort or pain in your jaw, neck, back, shoulder, or arm. You have any symptoms of a stroke. "BE FAST" is an easy way to remember the main warning signs of a stroke: B - Balance. Signs are dizziness, sudden trouble walking, or loss of balance. E - Eyes. Signs are trouble seeing or a sudden change in vision. F - Face. Signs are sudden weakness or numbness of the face, or the face or eyelid drooping on one side. A - Arms. Signs are weakness or numbness in an arm. This happens suddenly and usually on one side of the body. S - Speech. Signs are sudden trouble speaking, slurred speech, or trouble understanding what people say. T - Time. Time to call emergency services. Write down what time symptoms started. You have other signs of a stroke, such as: A  sudden, severe headache with no known cause. Nausea or vomiting. Seizure. These symptoms may represent a serious problem that is an emergency. Do not wait to see if the symptoms will go away. Get medical help right away. Call your local emergency services (911 in the [place] Do not drive yourself to the hospital. Summary Cholesterol plaques increase your risk for heart attack and stroke. Work with your health care provider to keep your cholesterol levels in a healthy range. Eat a healthy, balanced diet, get regular exercise, and maintain a healthy weight. Do not use any products that contain nicotine or tobacco. These products include cigarettes, chewing tobacco, and vaping devices, such as e-cigarettes. Get help right away if you have any symptoms of a stroke. This information is  not intended to replace advice given to you by your health care provider. Make sure you discuss any questions you have with your health care provider. Document Revised: 07/27/2020 Document Reviewed: 07/17/2020 Elsevier Patient Education  [place]. Rosuvastatin Tablets What is this medication? ROSUVASTATIN (roe SOO va sta tin) treats high cholesterol and reduces the risk of heart attack and stroke. It works by decreasing bad cholesterol and fats (such as LDL, triglycerides), and increasing good cholesterol (HDL) in your blood. It belongs to a group of medications called statins. Changes to diet and exercise are often combined with this medication. This medicine may be used for other purposes; ask your health care provider or pharmacist if you have questions. COMMON BRAND NAME(S): Crestor What should I tell my care team before I take this medication? They need to know if you have any of these conditions: Diabetes (high blood sugar) If you often drink alcohol Kidney disease Liver disease Muscle cramps, pain Stroke Thyroid disease An unusual or allergic reaction to rosuvastatin, other medications, foods, dyes, or preservatives Pregnant or trying to get pregnant Breast-feeding How should I use this medication? Take this medication by mouth with a glass of water. Follow the directions on the prescription label. You can take it with or without food. If it upsets your stomach, take it with food. Do not cut, crush or chew this medication. Swallow the tablets whole. Take your medication at regular intervals. Do not take it more often than directed. Take antacids that have a combination of aluminum and magnesium hydroxide in them at a different time of day than this medication. Take these products 2 hours AFTER this medication. Talk to your care team about the use of this medication in children. While this medication may be prescribed for children as young as 7 for selected conditions,  precautions do apply. Overdosage: If you think you have taken too much of this medicine contact a poison control center or emergency room at once. NOTE: This medicine is only for you. Do not share this medicine with others. What if I miss a dose? If you miss a dose, take it as soon as you can. If your next dose is to be taken in less than 12 hours, then do not take the missed dose. Take the next dose at your regular time. Do not take double or extra doses. What may interact with this medication? Do not take this medication with any of the following: Supplements like red yeast rice This medication may also interact with the following: Alcohol Antacids containing aluminum hydroxide and magnesium hydroxide Cyclosporine Other medications for high cholesterol Some medications for HIV infection Warfarin This list may not describe all possible interactions. Give your health care provider a list  of all the medicines, herbs, non-prescription drugs, or dietary supplements you use. Also tell them if you smoke, drink alcohol, or use illegal drugs. Some items may interact with your medicine. What should I watch for while using this medication? Visit your health care provider for regular checks on your progress. Tell your health care provider if your symptoms do not start to get better or if they get worse. Your health care provider may tell you to stop taking this medication if you develop muscle problems. If your muscle problems do not go away after stopping this medication, contact your health care provider. Do not become pregnant while taking this medication. Women should inform their health care provider if they wish to become pregnant or think they might be pregnant. There is potential for serious harm to an unborn child. Talk to your health care provider for more information. Do not breast-feed an infant while taking this medication. This medication may increase blood sugar. Ask your health care  provider if changes in diet or medications are needed if you have diabetes. If you are going to need surgery or other procedure, tell your health care provider that you are using this medication. Taking this medication is only part of a total heart healthy program. Your health care provider may give you a special diet to follow. Avoid alcohol. Avoid smoking. Ask your health care provider how much you should exercise. What side effects may I notice from receiving this medication? Side effects that you should report to your care team as soon as possible: Allergic reactions--skin rash, itching, hives, swelling of the face, lips, tongue, or throat High blood sugar (hyperglycemia)--increased thirst or amount of urine, unusual weakness, fatigue, blurry vision Liver injury--right upper belly pain, loss of appetite, nausea, light-colored stool, dark yellow or brown urine, yellowing skin or eyes, unusual weakness, fatigue Muscle injury--unusual weakness, fatigue, muscle pain, dark yellow or brown urine, decrease in amount of urine Redness, blistering, peeling or loosening of the skin, including inside the mouth Side effects that usually do not require medical attention (report to your care team if they continue or are bothersome): Fatigue Headache Nausea Stomach pain This list may not describe all possible side effects. Call your doctor for medical advice about side effects. You may report side effects to FDA at 1-800-FDA-1088. Where should I keep my medication? Keep out of the reach of children and pets. Store between 20 and 25 degrees C (68 and 77 degrees F). Get rid of any unused medication after the expiration date. To get rid of medications that are no longer needed or have expired: Take the medication to a medication take-back program. Check with your pharmacy or law enforcement to find a location. If you cannot return the medication, check the label or package insert to see if the medication  should be thrown out in the garbage or flushed down the toilet. If you are not sure, ask your care team. If it is safe to put it in the trash, take the medication out of the container. Mix the medication with cat litter, dirt, coffee grounds, or other unwanted substance. Seal the mixture in a bag or container. Put it in the trash. NOTE: This sheet is a summary. It may not cover all possible information. If you have questions about this medicine, talk to your doctor, pharmacist, or health care provider.  2022 Elsevier/Gold Standard (2020-06-09 00:00:00)

## 2021-05-30 NOTE — Progress Notes (Signed)
Established Patient Office Visit  Subjective:  Patient ID: Lisa Gonzalez, female    DOB: August 04, 1960  Age: 61 y.o. MRN: [id number]  CC:  Chief Complaint  Patient presents with   Follow-up    HPI Lisa Gonzalez presents for follow up on foot, Mor ans neuroma left foot. prednisone helped .  Chronic pain in right. Seeing DR. Evans.  Triglycerides were elevate last labs were after covid.  Covid was 3 weeks ago.  Not as active.  Feels had covid headache.   Patient  denies any fever, body aches,chills, rash, chest pain, shortness of breath, nausea, vomiting, or diarrhea. Denies dizziness, lightheadedness, pre syncopal or syncopal episodes.      Past Medical History:  Diagnosis Date   Allergy    Anxiety    Back pain    Cyst of left breast    GERD (gastroesophageal reflux disease)    History of endometriosis    History of ovarian cyst    PTSD (post-traumatic stress disorder)     Past Surgical History:  Procedure Laterality Date   COLONOSCOPY WITH PROPOFOL N/A 03/21/2021   Procedure: COLONOSCOPY WITH PROPOFOL;  Surgeon: Pasty Spillers, MD;  Location: [facility] ENDOSCOPY;  Service: Endoscopy;  Laterality: N/A;   LAPAROSCOPIC VAGINAL HYSTERECTOMY     SPINAL FUSION  2018   SPINE SURGERY      Family History  Problem Relation Age of Onset   Autoimmune disease Mother    Heart attack Father 40   Cancer - Colon Maternal Grandmother    Alzheimer's disease Maternal Grandmother    Heart attack Maternal Grandfather    Bone cancer Paternal Grandmother     Social History   Socioeconomic History   Marital status: Married    Spouse name: Not on file   Number of children: Not on file   Years of education: Not on file   Highest education level: Not on file  Occupational History   Not on file  Tobacco Use   Smoking status: Never   Smokeless tobacco: Never  Vaping Use   Vaping Use: Never used  Substance and Sexual Activity   Alcohol use: Yes    Alcohol/week: 1.0  standard drink    Types: 1 Glasses of wine per week   Drug use: Never   Sexual activity: Yes    Birth control/protection: None, Surgical    Comment: Hysterectomy  Other Topics Concern   Not on file  Social History Narrative   Not on file   Social Determinants of Health   Financial Resource Strain: Not on file  Food Insecurity: Not on file  Transportation Needs: Not on file  Physical Activity: Not on file  Stress: Not on file  Social Connections: Not on file  Intimate Partner Violence: Not on file    Outpatient Medications Prior to Visit  Medication Sig Dispense Refill   ALPRAZolam (XANAX) 0.5 MG tablet Take 1 tablet (0.5 mg total) by mouth 3 (three) times daily as needed for anxiety (not to take with lunesta). 90 tablet 0   clotrimazole-betamethasone (LOTRISONE) cream Apply externally BID for 2 wks 15 g 0   estradiol (VIVELLE-DOT) 0.075 MG/24HR Place 1 patch onto the skin 2 (two) times a week. 24 patch 3   Eszopiclone 3 MG TABS TAKE 1 TAB AT BEDTIME AS NEEDED FOR SLEEP. MAY TRY 1/2 TAB 1ST TO SEE EFFECTIVE BEFORE FULL TABLET. 30 tablet 0   loratadine (CLARITIN) 10 MG tablet Take 10 mg by mouth daily as  needed.     methocarbamol (ROBAXIN) 500 MG tablet Take 1 tablet (500 mg total) by mouth 4 (four) times daily. (Patient taking differently: Take 500 mg by mouth every 8 (eight) hours as needed.) 90 tablet 3   methylPREDNISolone (MEDROL DOSEPAK) 4 MG TBPK tablet 6 day dose pack - take as directed 21 tablet 0   omeprazole (PRILOSEC) 40 MG capsule Take 40 mg by mouth 2 (two) times daily as needed.     SUMAtriptan (IMITREX) 100 MG tablet Take one  tablet for migraine as needed. May repeat in 2 hours if headache persists or recurs.Maximum dose in 200 mg per day. 10 tablet 0   Facility-Administered Medications Prior to Visit  Medication Dose Route Frequency Provider Last Rate Last Admin   betamethasone acetate-betamethasone sodium phosphate (CELESTONE) injection 3 mg  3 mg Intra-articular  Once Edrick Kins, DPM        Allergies  Allergen Reactions   Meperidine Swelling and Nausea Only    Blood Pressure Dropped Low BP and pass out.    Hydrocodone Nausea And Vomiting, Swelling and Nausea Only    Hot and cold chills, vomiting, headache, and chest pain  Other reaction(s): Vomiting Hot and cold chills, headache, chest pain   Hydromorphone Nausea And Vomiting and Nausea Only    Other reaction(s): Vomiting   Opium Other (See Comments)    Hot and cold chills, vomiting, headache and chest pain   Oxycodone Nausea And Vomiting and Nausea Only    Other reaction(s): Other (comments) Hot and cold chills, headache, vomiting, and chest pain.     ROS Review of Systems  Constitutional:  Positive for fatigue. Negative for activity change, appetite change, chills, diaphoresis, fever and unexpected weight change.  HENT: Negative.    Respiratory:  Positive for cough. Negative for apnea, choking, chest tightness, shortness of breath, wheezing and stridor.   Cardiovascular: Negative.   Genitourinary: Negative.   Musculoskeletal: Negative.   Neurological: Negative.   Hematological: Negative.   Psychiatric/Behavioral: Negative.       Objective:    Physical Exam Vitals reviewed.  Constitutional:      General: She is not in acute distress.    Appearance: Normal appearance. She is not ill-appearing, toxic-appearing or diaphoretic.  HENT:     Head: Normocephalic and atraumatic.     Right Ear: External ear normal. There is no impacted cerumen.     Left Ear: External ear normal. There is no impacted cerumen.     Nose: Nose normal. No congestion or rhinorrhea.     Mouth/Throat:     Mouth: Mucous membranes are moist.     Pharynx: Oropharynx is clear.  Cardiovascular:     Rate and Rhythm: Normal rate and regular rhythm.     Heart sounds: No murmur heard.   No friction rub. No gallop.  Pulmonary:     Effort: Pulmonary effort is normal. No respiratory distress.     Breath  sounds: Normal breath sounds. No stridor. No wheezing, rhonchi or rales.  Chest:     Chest wall: No tenderness.  Abdominal:     Palpations: Abdomen is soft.  Musculoskeletal:        General: Normal range of motion.     Cervical back: Normal range of motion and neck supple.  Lymphadenopathy:     Cervical: No cervical adenopathy.  Skin:    General: Skin is warm.     Findings: No erythema or rash.  Neurological:     Mental  Status: She is oriented to person, place, and time.    BP 114/62    Pulse 95    Ht 5' 7.76" (1.721 m)    Wt 158 lb 9.6 oz (71.9 kg)    SpO2 95%    BMI 24.29 kg/m  Wt Readings from Last 3 Encounters:  05/30/21 158 lb 9.6 oz (71.9 kg)  05/10/21 153 lb (69.4 kg)  05/02/21 153 lb 8 oz (69.6 kg)     Health Maintenance Due  Topic Date Due   Hepatitis C Screening  Never done   TETANUS/TDAP  Never done   Zoster Vaccines- Shingrix (1 of 2) Never done   INFLUENZA VACCINE  Never done    There are no preventive care reminders to display for this patient.  Lab Results  Component Value Date   TSH 2.08 05/18/2021   Lab Results  Component Value Date   WBC 9.4 05/18/2021   HGB 14.2 05/18/2021   HCT 43.1 05/18/2021   MCV 91.2 05/18/2021   PLT 308.0 05/18/2021   Lab Results  Component Value Date   NA 139 05/18/2021   K 4.1 05/18/2021   CO2 33 (H) 05/18/2021   GLUCOSE 96 05/18/2021   BUN 22 05/18/2021   CREATININE 0.96 05/18/2021   BILITOT 0.7 05/18/2021   ALKPHOS 58 05/18/2021   AST 17 05/18/2021   ALT 19 05/18/2021   PROT 7.3 05/18/2021   ALBUMIN 4.5 05/18/2021   CALCIUM 9.7 05/18/2021   EGFR 66 09/22/2020   GFR 64.28 05/18/2021   Lab Results  Component Value Date   CHOL 257 (H) 05/18/2021   Lab Results  Component Value Date   HDL 49.10 05/18/2021   Lab Results  Component Value Date   LDLCALC 178 (H) 05/18/2021   Lab Results  Component Value Date   TRIG 151.0 (H) 05/18/2021   Lab Results  Component Value Date   CHOLHDL 5 05/18/2021    Lab Results  Component Value Date   HGBA1C 5.6 08/18/2019      Assessment & Plan:   Problem List Items Addressed This Visit       Other   Hyperlipidemia   Relevant Medications   rosuvastatin (CRESTOR) 5 MG tablet   Other Relevant Orders   Hepatic function panel   Lipid panel   Other Visit Diagnoses     Acute cough    -  Primary   Relevant Orders   CBC with Differential/Platelet   Comprehensive metabolic panel   DG Chest 2 View   History of COVID-19       Relevant Orders   CBC with Differential/Platelet   Comprehensive metabolic panel   DG Chest 2 View        Meds ordered this encounter  Medications   rosuvastatin (CRESTOR) 5 MG tablet    Sig: Take 1 tablet (5 mg total) by mouth daily.    Dispense:  90 tablet    Refill:  3  Discussed side effects of medications and when to call.   Red Flags discussed. The patient was given clear instructions to go to ER or return to medical center if any red flags develop, symptoms do not improve, worsen or new problems develop. They verbalized understanding.   Follow-up: Return in about 3 months (around 08/28/2021), or if symptoms worsen or fail to improve, for at any time for any worsening symptoms, Go to Emergency room/ urgent care if worse.    Marcille Buffy, FNP
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
## 2021-05-30 NOTE — Patient Instructions (Addendum)
° °  Stacked over COM over ballmounds  Side stepping with COM over knee, knee behind toes  Step to hip width part notfeet next to each other 10 reps after between clients    Timmothy Sours / doff shoes        with figure -4   Heels back before getting out of  chair

## 2021-06-06 ENCOUNTER — Other Ambulatory Visit: Payer: Self-pay | Admitting: Otolaryngology

## 2021-06-06 DIAGNOSIS — G501 Atypical facial pain: Secondary | ICD-10-CM

## 2021-06-07 ENCOUNTER — Other Ambulatory Visit: Payer: Self-pay

## 2021-06-07 ENCOUNTER — Ambulatory Visit: Payer: Commercial Managed Care - HMO | Admitting: Physical Therapy

## 2021-06-07 DIAGNOSIS — M79672 Pain in left foot: Secondary | ICD-10-CM

## 2021-06-07 DIAGNOSIS — M25551 Pain in right hip: Secondary | ICD-10-CM

## 2021-06-07 DIAGNOSIS — M6208 Separation of muscle (nontraumatic), other site: Secondary | ICD-10-CM

## 2021-06-07 DIAGNOSIS — R2689 Other abnormalities of gait and mobility: Secondary | ICD-10-CM

## 2021-06-07 DIAGNOSIS — R102 Pelvic and perineal pain: Secondary | ICD-10-CM

## 2021-06-08 NOTE — Therapy (Signed)
[place] MAIN [facility] SERVICES [address] [place], [place], [address] Phone: [phone]   Fax:  [phone]  Patient Details  Name: Lisa Gonzalez MRN: [id number] Date of Birth: [birth year] Referring Provider:  Lovell Sheehan, MD  Encounter Date: 06/07/2021  PT is on hold for the next 4 weeks.  Pt's insurance is changing next month and she will get more coverage for her PT visits.  Today's Tx was withheld due to her financial restraints and also her Sx have made a positive turning point.   Pt reported today after completing her 9th visit at her last session 05/30/21:  Overall everything is getting better. Pt feels foot pain is now discomfort. Pt walked 1 mile separately 3 x this week and had no pain before, during, and after. Pt also tried a yoga pose she used to like but it was painful ( pigeon pose). Pt is able to sleep on her R side longer but it still wakes her up. Pt is having less sitting pain less often during the hour compared to having pain the whole time during the hour with clients. Pt is not having days where she is having pain at the end of the day which means she is recovering everyday.Overall everything is getting better.   Pt feels foot pain is now not painful but more felt as discomfort. Pt walked 1 mile separately 3 x this week and had no pain before, during, and after. Pt also tried a yoga pose she used to like but it was painful ( pigeon pose). Pt is able to sleep on her R side longer but it still wakes her up. Pt is having less sitting pain less often during the hour compared to having pain the whole time during the hour with clients. Pt is not having days where she is having pain at the end of the day which means she is recovering everyday.   Her next scheduled appt in 4 weeks will  be focused on helping her achieve her goal to stay asleep on her R hip and to modify/ educate how to practice her self-selected stretches that she  enjoyed prior to POC, namely pigeon pose which she expressed she tried for the first time again but caused her pain. Anticipate pt will continue to advance towards her goals with more skilled PT.   Jerl Mina, PT 06/08/2021, 8:10 AM  [place] MAIN [facility] SERVICES [address] [place], [place], [address] Phone: [phone]   Fax:  [phone]
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
## 2021-06-15 ENCOUNTER — Ambulatory Visit: Payer: PRIVATE HEALTH INSURANCE | Admitting: Podiatry

## 2021-06-19 ENCOUNTER — Encounter: Payer: 59 | Admitting: Physical Therapy

## 2021-06-25 ENCOUNTER — Encounter: Payer: 59 | Admitting: Physical Therapy

## 2021-06-25 ENCOUNTER — Encounter: Payer: Self-pay | Admitting: Adult Health

## 2021-06-29 ENCOUNTER — Encounter: Payer: Self-pay | Admitting: Adult Health

## 2021-07-03 ENCOUNTER — Ambulatory Visit (INDEPENDENT_AMBULATORY_CARE_PROVIDER_SITE_OTHER): Payer: Managed Care, Other (non HMO) | Admitting: Podiatry

## 2021-07-03 ENCOUNTER — Other Ambulatory Visit: Payer: Self-pay

## 2021-07-03 ENCOUNTER — Encounter: Payer: Self-pay | Admitting: Podiatry

## 2021-07-03 DIAGNOSIS — G5762 Lesion of plantar nerve, left lower limb: Secondary | ICD-10-CM

## 2021-07-03 NOTE — Progress Notes (Signed)
HPI: 61 y.o. female presenting today for follow-up evaluation of a Morton's neuroma to the left second interspace.  Patient states that she is feeling much better.  The injection helped significantly.  Overall she is much better however she still is concerned about the splay toe deformity.  She presents for further treatment and evaluation  Past Medical History:  Diagnosis Date   Allergy    Anxiety    Back pain    Cyst of left breast    GERD (gastroesophageal reflux disease)    History of endometriosis    History of ovarian cyst    PTSD (post-traumatic stress disorder)     Past Surgical History:  Procedure Laterality Date   COLONOSCOPY WITH PROPOFOL N/A 03/21/2021   Procedure: COLONOSCOPY WITH PROPOFOL;  Surgeon: Virgel Manifold, MD;  Location: [facility] ENDOSCOPY;  Service: Endoscopy;  Laterality: N/A;   LAPAROSCOPIC VAGINAL HYSTERECTOMY     SPINAL FUSION  2018   SPINE SURGERY      Allergies  Allergen Reactions   Meperidine Swelling and Nausea Only    Blood Pressure Dropped Low BP and pass out.    Hydrocodone Nausea And Vomiting, Swelling and Nausea Only    Hot and cold chills, vomiting, headache, and chest pain  Other reaction(s): Vomiting Hot and cold chills, headache, chest pain   Hydromorphone Nausea And Vomiting and Nausea Only    Other reaction(s): Vomiting   Opium Other (See Comments)    Hot and cold chills, vomiting, headache and chest pain   Oxycodone Nausea And Vomiting and Nausea Only    Other reaction(s): Other (comments) Hot and cold chills, headache, vomiting, and chest pain.       Physical Exam: General: The patient is alert and oriented x3 in no acute distress.  Dermatology: Skin is warm, dry and supple bilateral lower extremities. Negative for open lesions or macerations.  Vascular: Palpable pedal pulses bilaterally. Capillary refill within normal limits.  Negative for any significant edema or erythema  Neurological: Light touch and protective  threshold grossly intact  Musculoskeletal Exam: Slight pain on palpation noted to the second interspace of the left foot with pain with lateral compression of the metatarsal heads.  There is also a splay toe deformity noted on weightbearing and loading of the foot  Radiographic Exam:  Normal osseous mineralization. Joint spaces preserved. No fracture/dislocation/boney destruction.  There is some splay toe deformity between the second and third digits visible on x-ray consistent with the clinical findings of a Morton's neuroma  Assessment: 1.  Morton's neuroma left second interspace   Plan of Care:  1. Patient evaluated.  No NSAIDs due to GI complications.  Patient also states that although she took the Medrol Dosepak she was unable to sleep. 2.  Overall the patient is doing much better.  The pain has resolved however she still feels some slight discomfort with the knot between the second and third toes.  She is also concerned for the splay toe deformity. 3.  Continue OTC insoles from the good feet store.  In the meantime we will set up an appointment with the pedorthist for custom molded orthotics to support the arch and alleviate pressure from the forefoot.  Potentially this may help with the splay toe deformity. 4.  I explained to the patient that other than orthotics and arch supports I do not see many other options to correct for the splay toe deformity caused by the neuroma.  We will simply observe for now 5.  If it becomes symptomatic again or if the splay toe deformity does not improve over time we may need to consider surgical intervention to remove the neuroma and realign the toes 6.  Return to clinic as needed     Edrick Kins, DPM [facility]  Dr. Edrick Kins, DPM    [address], [place] [address]                Office [phone]  Fax [phone]

## 2021-07-04 ENCOUNTER — Ambulatory Visit: Payer: Managed Care, Other (non HMO) | Attending: Orthopedic Surgery | Admitting: Physical Therapy

## 2021-07-04 DIAGNOSIS — M25551 Pain in right hip: Secondary | ICD-10-CM | POA: Diagnosis present

## 2021-07-04 DIAGNOSIS — M533 Sacrococcygeal disorders, not elsewhere classified: Secondary | ICD-10-CM | POA: Insufficient documentation

## 2021-07-04 DIAGNOSIS — R2689 Other abnormalities of gait and mobility: Secondary | ICD-10-CM | POA: Insufficient documentation

## 2021-07-04 DIAGNOSIS — M79672 Pain in left foot: Secondary | ICD-10-CM | POA: Diagnosis present

## 2021-07-04 DIAGNOSIS — R102 Pelvic and perineal pain: Secondary | ICD-10-CM | POA: Insufficient documentation

## 2021-07-04 DIAGNOSIS — M6208 Separation of muscle (nontraumatic), other site: Secondary | ICD-10-CM | POA: Diagnosis present

## 2021-07-04 NOTE — Patient Instructions (Signed)
Multifidis twist  Band is on doorknob: stand further away from door (facing perpendicular)   Twisting trunk without moving the hips and knees Hold band at the level of ribcage, elbows bent,shoulder blades roll back and down like squeezing a pencil under armpit    Exhale twist,.10-15 deg away from door without moving your hips/ knees. Continue to maintain equal weight through legs. Keep knee unlocked.  30 reps  ___  Stretches : (Cuing provided for proper alignment)  Instructions start with Strap on R    Stretches for your legs: LAYING on Back Use upper arms and elbows for stability when pulling strap Opposite knee bent and foot firm in align with hip   Strap on ballmound:  Hip socket  _strap, L knee bent, R ballmound against strap and spread toes, rolling foot 15 deg out and in across midline.  10 reps each side   Hamstring _knee bends  10 reps  With knee pointing straight ( slightly to outside to minimize snapping sensation)      10 reps with knee pointing out towards armpit ( notice the stretch in the medial hamstring muscle)     IT band  _scoot hips to R, cross R leg over L and straighten knee with strap on ballmound,   Bend knee back and forth 5x    Quad in sidelying _strap around the ankle, pulling ankle towards buttocks  Bottom leg firm and stabilization with knee bent  Adductors and pelvic floor ( Happy Baby) : Delane Ginger are wide towards armpits, sole of feet towards ceiling   On your back again:  Strap under R thigh Hip abductors ( figure 4)      ,L ankle over R thigh ( stretching L glut)     5  Breaths   ___  Sitting with tactile cues on rib and ASIS to prevent over arch ing back With the new exercises, attention to the chin tuck and shoulder down and back
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
## 2021-07-07 ENCOUNTER — Other Ambulatory Visit: Payer: Self-pay | Admitting: Obstetrics and Gynecology

## 2021-07-07 DIAGNOSIS — Z79890 Hormone replacement therapy (postmenopausal): Secondary | ICD-10-CM

## 2021-07-07 DIAGNOSIS — N951 Menopausal and female climacteric states: Secondary | ICD-10-CM

## 2021-07-09 MED ORDER — ESTRADIOL 0.075 MG/24HR TD PTTW: 1.0000 | MEDICATED_PATCH | TRANSDERMAL | 0 refills | Status: DC

## 2021-07-09 NOTE — Addendum Note (Signed)
Addended by: Meryl Dare on: 07/09/2021 03:23 PM   Modules accepted: Orders

## 2021-07-09 NOTE — Telephone Encounter (Signed)
Patient has AE 09/05/21. Needs refill of Estradiol. VK#[phone]

## 2021-07-09 NOTE — Telephone Encounter (Signed)
Spoke w/patient to verify qty on hand. She has 1 month left. Advised will send in 1 mo refill.

## 2021-07-11 ENCOUNTER — Other Ambulatory Visit: Payer: Self-pay

## 2021-07-11 ENCOUNTER — Ambulatory Visit: Payer: Managed Care, Other (non HMO) | Admitting: Physical Therapy

## 2021-07-11 ENCOUNTER — Ambulatory Visit
Admission: RE | Admit: 2021-07-11 | Discharge: 2021-07-11 | Disposition: A | Discharge status: Home / Self Care | Payer: Managed Care, Other (non HMO) | Source: Ambulatory Visit | Attending: Otolaryngology | Admitting: Otolaryngology

## 2021-07-11 DIAGNOSIS — M25551 Pain in right hip: Secondary | ICD-10-CM

## 2021-07-11 DIAGNOSIS — R2689 Other abnormalities of gait and mobility: Secondary | ICD-10-CM | POA: Diagnosis not present

## 2021-07-11 DIAGNOSIS — R102 Pelvic and perineal pain: Secondary | ICD-10-CM

## 2021-07-11 DIAGNOSIS — M79672 Pain in left foot: Secondary | ICD-10-CM

## 2021-07-11 DIAGNOSIS — G501 Atypical facial pain: Secondary | ICD-10-CM | POA: Diagnosis present

## 2021-07-11 DIAGNOSIS — M6208 Separation of muscle (nontraumatic), other site: Secondary | ICD-10-CM

## 2021-07-11 IMAGING — CT CT MAXILLOFACIAL W/O CM
3 series · 15 of 47 positions shown, 18 images · non-contrast
Comparison: Face MRI [DATE].

CLINICAL DATA: 60-year-old female with facial numbness tingling and
pain. Symptoms primarily around the nose.



[Series 2: standard · axial · 0.35mm/px · z∈[+315,+453]mm · 9 of 162 slices shown, 12 images]
[im 12/162  brain]
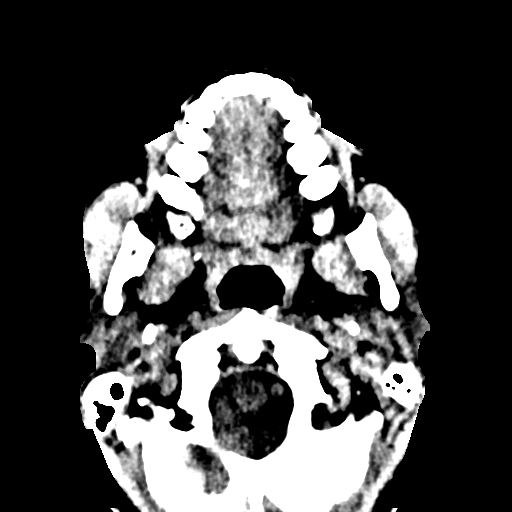
[im 12/162  bone]
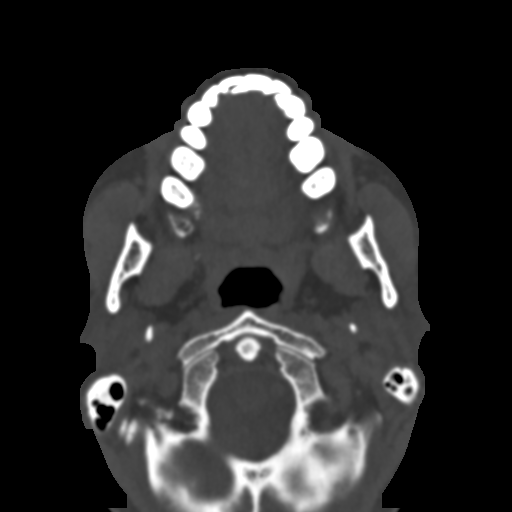
[im 28/162  bone]
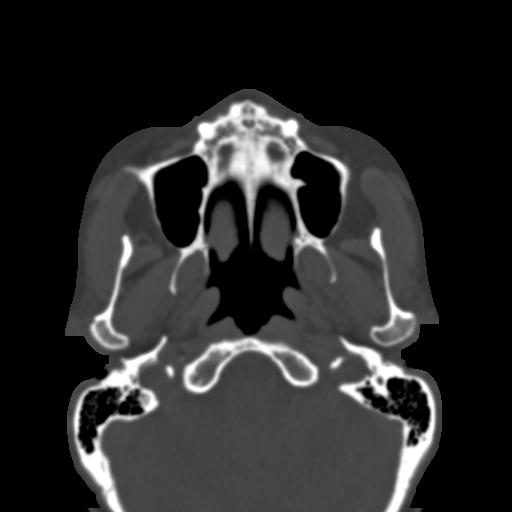
[im 45/162  bone]
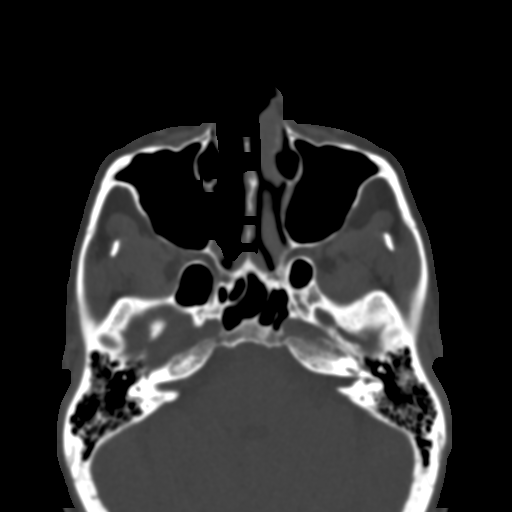
[im 62/162  bone]
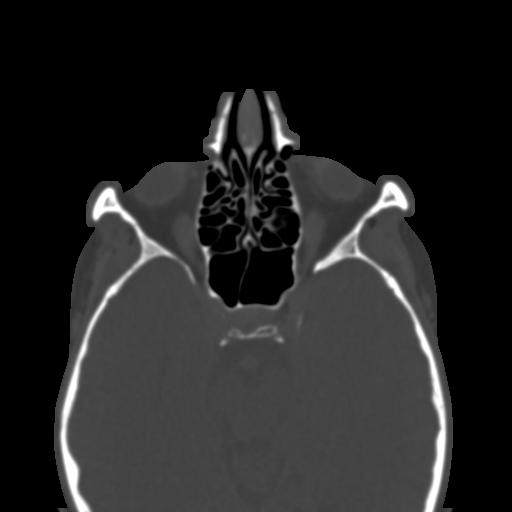
[im 84/162  brain]
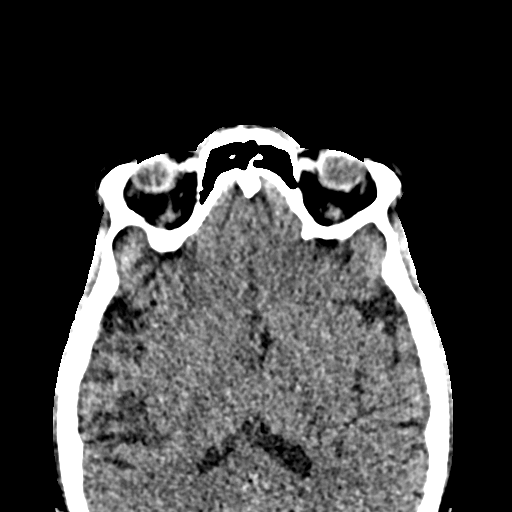
[im 84/162  bone]
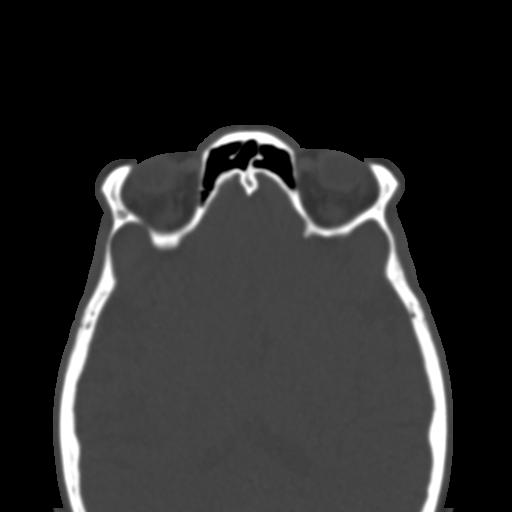
[im 100/162  bone]
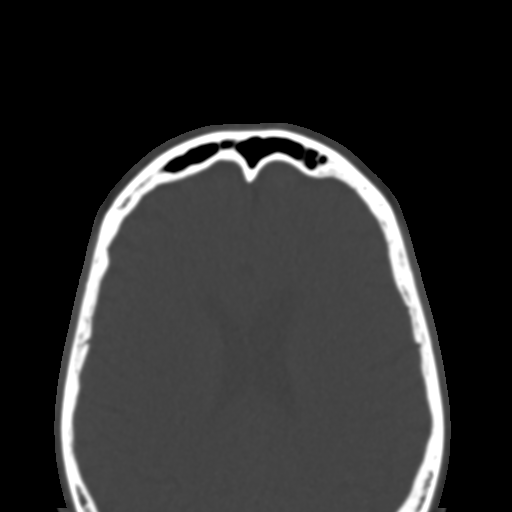
[im 117/162  bone]
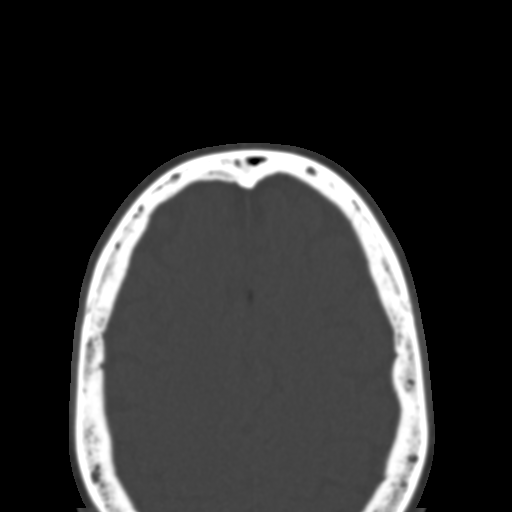
[im 134/162  bone]
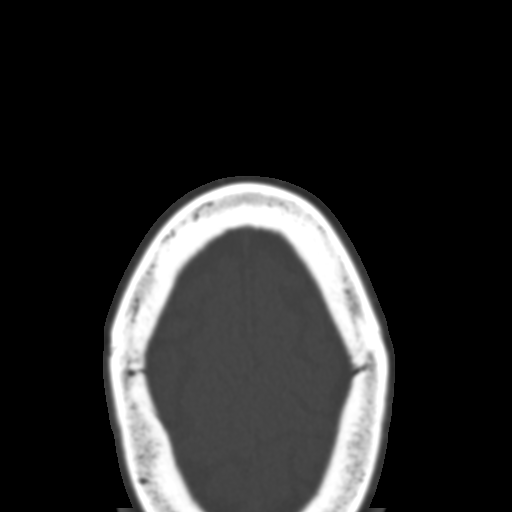
[im 150/162  brain]
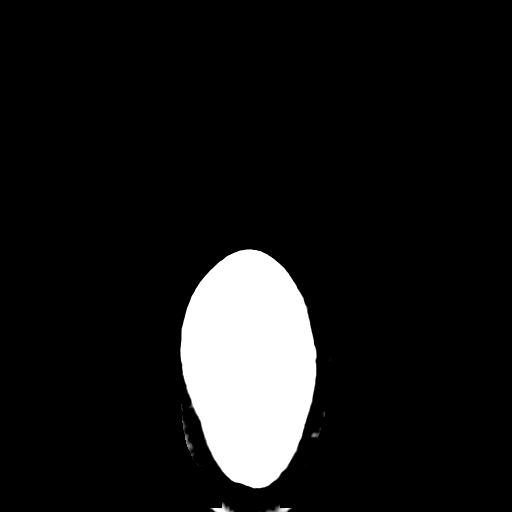
[im 150/162  bone]
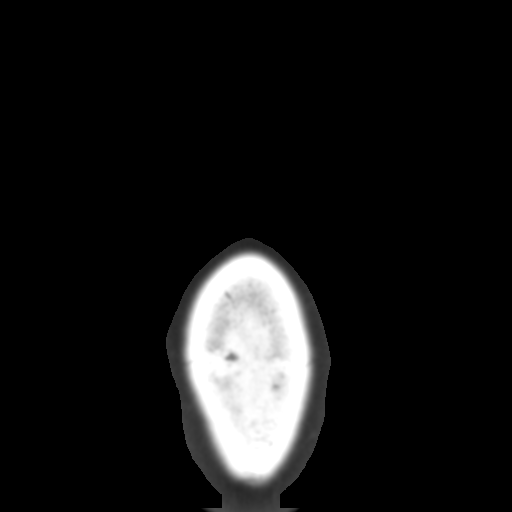

[Series 4: coronal · coronal · 0.34mm/px · 3 of 71 slices shown]
[im 24/71  bone]
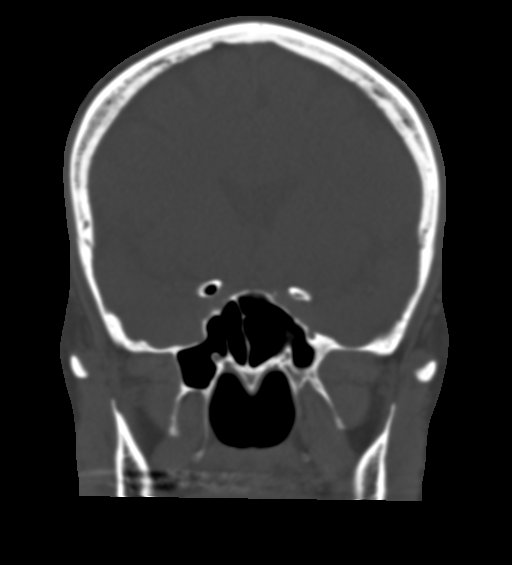
[im 32/71  bone]
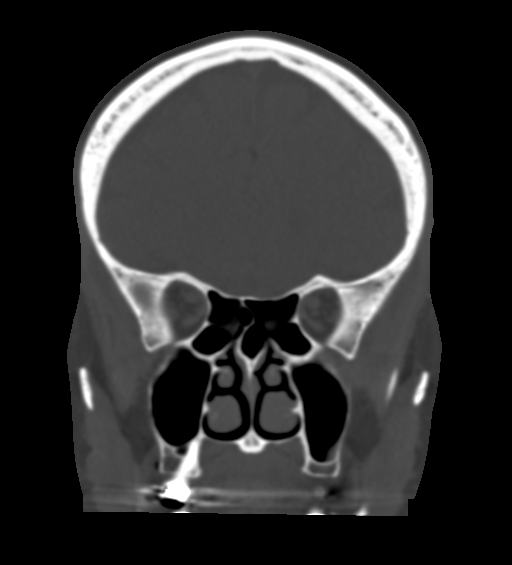
[im 39/71  bone]
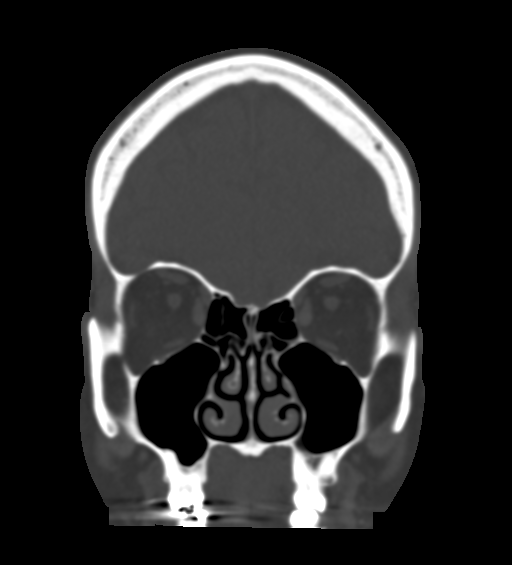

[Series 5: sagittal · sagittal · 0.32mm/px · 3 of 89 slices shown]
[im 30/89  bone]
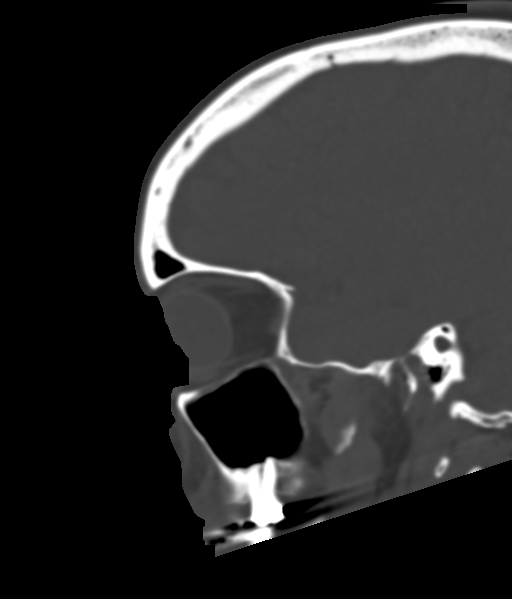
[im 45/89  bone]
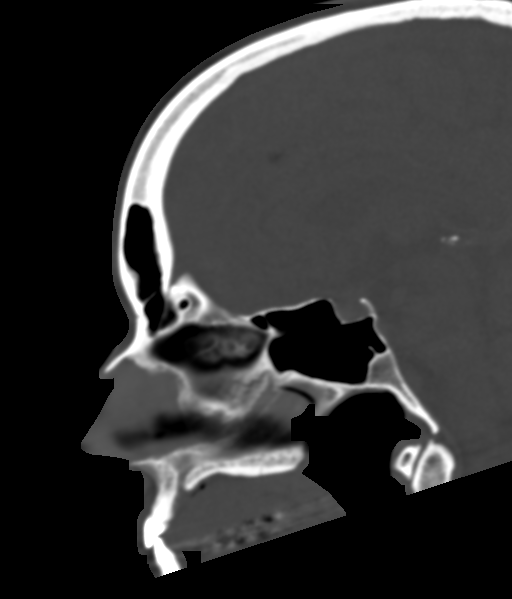
[im 59/89  bone]
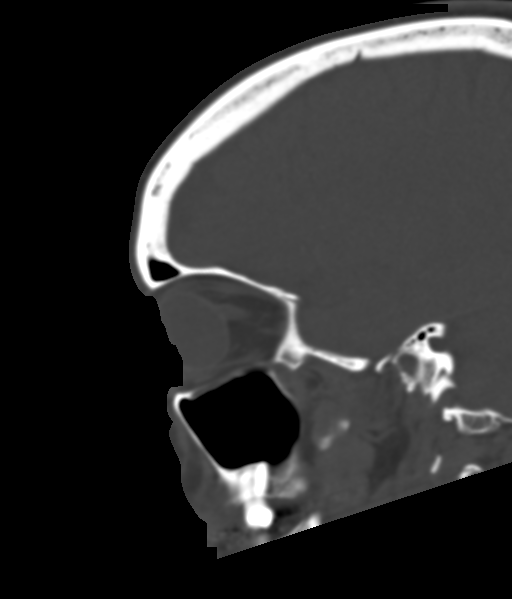

[15 of 47 positions shown; findings below may reference images not displayed]

FINDINGS: Osseous: Entire mandible not included, but bilateral TMJ aligned. No
acute dental finding identified. Maxilla, zygoma, pterygoid, and
nasal bones intact. Central skull base intact. No acute osseous
abnormality identified.

Orbits: Intact orbital walls. Globes and intraorbital soft tissues
appears symmetric and normal. Symmetric and normal bilateral
pterygoid palatine fossa.

Sinuses: Clear throughout. Tympanic cavities and mastoids are clear.

Soft tissues: Negative visible noncontrast pharynx (postinflammatory
left tonsillar calcifications), parapharyngeal spaces,
retropharyngeal space, masticator and parotid spaces.

Superficial facial soft tissues appears symmetric and within normal
limits.

Limited intracranial: Negative.
IMPRESSION: Unremarkable noncontrast CT appearance of the visible face.

## 2021-07-11 NOTE — Patient Instructions (Signed)
Stretch for pelvic floor     On belly: Riding horse edge of mattress  knee bent like riding a horse, move knee towards armpit and out  10 reps   childs poses rocking  Toes tucked, shoulders down and back, on forearms , hands shoulder width apart  10 reps    ___  Notice lengthening of pelvic floor with inhale during deep core level 1-2
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
## 2021-07-13 ENCOUNTER — Other Ambulatory Visit (INDEPENDENT_AMBULATORY_CARE_PROVIDER_SITE_OTHER): Payer: Managed Care, Other (non HMO)

## 2021-07-13 ENCOUNTER — Encounter: Payer: Self-pay | Admitting: Adult Health

## 2021-07-13 ENCOUNTER — Other Ambulatory Visit: Payer: Self-pay

## 2021-07-13 DIAGNOSIS — R051 Acute cough: Secondary | ICD-10-CM

## 2021-07-13 DIAGNOSIS — E785 Hyperlipidemia, unspecified: Secondary | ICD-10-CM

## 2021-07-13 DIAGNOSIS — Z8616 Personal history of COVID-19: Secondary | ICD-10-CM

## 2021-07-13 LAB — CBC WITH DIFFERENTIAL/PLATELET
Basophils Absolute: 0 10*3/uL (ref 0.0–0.1)
Basophils Relative: 0.7 % (ref 0.0–3.0)
Eosinophils Absolute: 0.1 10*3/uL (ref 0.0–0.7)
Eosinophils Relative: 2.1 % (ref 0.0–5.0)
HCT: 39.5 % (ref 36.0–46.0)
Hemoglobin: 13.4 g/dL (ref 12.0–15.0)
Lymphocytes Relative: 22.5 % (ref 12.0–46.0)
Lymphs Abs: 1.2 10*3/uL (ref 0.7–4.0)
MCHC: 33.9 g/dL (ref 30.0–36.0)
MCV: 90.5 fl (ref 78.0–100.0)
Monocytes Absolute: 0.5 10*3/uL (ref 0.1–1.0)
Monocytes Relative: 9.4 % (ref 3.0–12.0)
Neutro Abs: 3.6 10*3/uL (ref 1.4–7.7)
Neutrophils Relative %: 65.3 % (ref 43.0–77.0)
Platelets: 221 10*3/uL (ref 150.0–400.0)
RBC: 4.37 Mil/uL (ref 3.87–5.11)
RDW: 13.9 % (ref 11.5–15.5)
WBC: 5.5 10*3/uL (ref 4.0–10.5)

## 2021-07-13 LAB — COMPREHENSIVE METABOLIC PANEL
ALT: 15 U/L (ref 0–35)
AST: 20 U/L (ref 0–37)
Albumin: 4.5 g/dL (ref 3.5–5.2)
Alkaline Phosphatase: 50 U/L (ref 39–117)
BUN: 23 mg/dL (ref 6–23)
CO2: 36 mEq/L — ABNORMAL HIGH (ref 19–32)
Calcium: 9.3 mg/dL (ref 8.4–10.5)
Chloride: 99 mEq/L (ref 96–112)
Creatinine, Ser: 0.94 mg/dL (ref 0.40–1.20)
GFR: 65.85 mL/min (ref >60.00)
Glucose, Bld: 98 mg/dL (ref 70–99)
Potassium: 4.2 mEq/L (ref 3.5–5.1)
Sodium: 138 mEq/L (ref 135–145)
Total Bilirubin: 0.7 mg/dL (ref 0.2–1.2)
Total Protein: 6.9 g/dL (ref 6.0–8.3)

## 2021-07-13 LAB — HEPATIC FUNCTION PANEL
ALT: 15 U/L (ref 0–35)
AST: 20 U/L (ref 0–37)
Albumin: 4.5 g/dL (ref 3.5–5.2)
Alkaline Phosphatase: 50 U/L (ref 39–117)
Bilirubin, Direct: 0.1 mg/dL (ref 0.0–0.3)
Total Bilirubin: 0.7 mg/dL (ref 0.2–1.2)
Total Protein: 6.9 g/dL (ref 6.0–8.3)

## 2021-07-13 LAB — LIPID PANEL
Cholesterol: 199 mg/dL (ref 0–200)
HDL: 49.8 mg/dL (ref >39.00)
LDL Cholesterol: 134 mg/dL — ABNORMAL HIGH (ref 0–99)
NonHDL: 148.96
Total CHOL/HDL Ratio: 4
Triglycerides: 77 mg/dL (ref 0.0–149.0)
VLDL: 15.4 mg/dL (ref 0.0–40.0)

## 2021-07-15 NOTE — Progress Notes (Signed)
CMP ok,  repeat in 3 months. Recheck lipid panel in 3 months as well. Cholesterol improving, liver and function labs stable. CBC is within normal limits.  Continue statin.

## 2021-07-17 ENCOUNTER — Telehealth: Payer: Self-pay

## 2021-07-17 ENCOUNTER — Other Ambulatory Visit: Payer: Self-pay

## 2021-07-17 DIAGNOSIS — R062 Wheezing: Secondary | ICD-10-CM

## 2021-07-17 DIAGNOSIS — R058 Other specified cough: Secondary | ICD-10-CM

## 2021-07-17 DIAGNOSIS — J069 Acute upper respiratory infection, unspecified: Secondary | ICD-10-CM

## 2021-07-17 NOTE — Telephone Encounter (Signed)
Lm for pt to cb re : sched xray and labs

## 2021-07-17 NOTE — Telephone Encounter (Signed)
Lm for pt to cb.

## 2021-07-17 NOTE — Telephone Encounter (Signed)
Pt advised - labs ordered - appointments scheduled

## 2021-07-18 ENCOUNTER — Ambulatory Visit: Payer: Managed Care, Other (non HMO) | Admitting: Physical Therapy

## 2021-07-18 ENCOUNTER — Other Ambulatory Visit: Payer: Self-pay

## 2021-07-18 ENCOUNTER — Other Ambulatory Visit (INDEPENDENT_AMBULATORY_CARE_PROVIDER_SITE_OTHER): Payer: Managed Care, Other (non HMO)

## 2021-07-18 ENCOUNTER — Ambulatory Visit (INDEPENDENT_AMBULATORY_CARE_PROVIDER_SITE_OTHER): Payer: Managed Care, Other (non HMO)

## 2021-07-18 DIAGNOSIS — Z8616 Personal history of COVID-19: Secondary | ICD-10-CM

## 2021-07-18 DIAGNOSIS — R062 Wheezing: Secondary | ICD-10-CM

## 2021-07-18 DIAGNOSIS — M533 Sacrococcygeal disorders, not elsewhere classified: Secondary | ICD-10-CM

## 2021-07-18 DIAGNOSIS — R051 Acute cough: Secondary | ICD-10-CM | POA: Diagnosis not present

## 2021-07-18 DIAGNOSIS — M25551 Pain in right hip: Secondary | ICD-10-CM

## 2021-07-18 DIAGNOSIS — R102 Pelvic and perineal pain: Secondary | ICD-10-CM

## 2021-07-18 DIAGNOSIS — R058 Other specified cough: Secondary | ICD-10-CM

## 2021-07-18 DIAGNOSIS — R2689 Other abnormalities of gait and mobility: Secondary | ICD-10-CM

## 2021-07-18 DIAGNOSIS — M79672 Pain in left foot: Secondary | ICD-10-CM

## 2021-07-18 DIAGNOSIS — J069 Acute upper respiratory infection, unspecified: Secondary | ICD-10-CM

## 2021-07-18 DIAGNOSIS — M6208 Separation of muscle (nontraumatic), other site: Secondary | ICD-10-CM

## 2021-07-18 LAB — COMPREHENSIVE METABOLIC PANEL
ALT: 14 U/L (ref 0–35)
AST: 17 U/L (ref 0–37)
Albumin: 4.4 g/dL (ref 3.5–5.2)
Alkaline Phosphatase: 52 U/L (ref 39–117)
BUN: 33 mg/dL — ABNORMAL HIGH (ref 6–23)
CO2: 33 mEq/L — ABNORMAL HIGH (ref 19–32)
Calcium: 9.8 mg/dL (ref 8.4–10.5)
Chloride: 100 mEq/L (ref 96–112)
Creatinine, Ser: 0.89 mg/dL (ref 0.40–1.20)
GFR: 70.31 mL/min (ref >60.00)
Glucose, Bld: 91 mg/dL (ref 70–99)
Potassium: 4.3 mEq/L (ref 3.5–5.1)
Sodium: 137 mEq/L (ref 135–145)
Total Bilirubin: 0.4 mg/dL (ref 0.2–1.2)
Total Protein: 7 g/dL (ref 6.0–8.3)

## 2021-07-18 LAB — CBC WITH DIFFERENTIAL/PLATELET
Basophils Absolute: 0 10*3/uL (ref 0.0–0.1)
Basophils Relative: 0.5 % (ref 0.0–3.0)
Eosinophils Absolute: 0.1 10*3/uL (ref 0.0–0.7)
Eosinophils Relative: 1 % (ref 0.0–5.0)
HCT: 38.9 % (ref 36.0–46.0)
Hemoglobin: 13.1 g/dL (ref 12.0–15.0)
Lymphocytes Relative: 16.2 % (ref 12.0–46.0)
Lymphs Abs: 1.4 10*3/uL (ref 0.7–4.0)
MCHC: 33.6 g/dL (ref 30.0–36.0)
MCV: 90.8 fl (ref 78.0–100.0)
Monocytes Absolute: 0.5 10*3/uL (ref 0.1–1.0)
Monocytes Relative: 5.9 % (ref 3.0–12.0)
Neutro Abs: 6.5 10*3/uL (ref 1.4–7.7)
Neutrophils Relative %: 76.4 % (ref 43.0–77.0)
Platelets: 249 10*3/uL (ref 150.0–400.0)
RBC: 4.29 Mil/uL (ref 3.87–5.11)
RDW: 13.8 % (ref 11.5–15.5)
WBC: 8.5 10*3/uL (ref 4.0–10.5)

## 2021-07-18 IMAGING — DX DG CHEST 2V
2 series · 2 of 2 positions shown · non-contrast
Comparison: [DATE]

CLINICAL DATA: Cough, COVID pneumonia

EXAM:
CHEST - 2 VIEW

[chest pa]
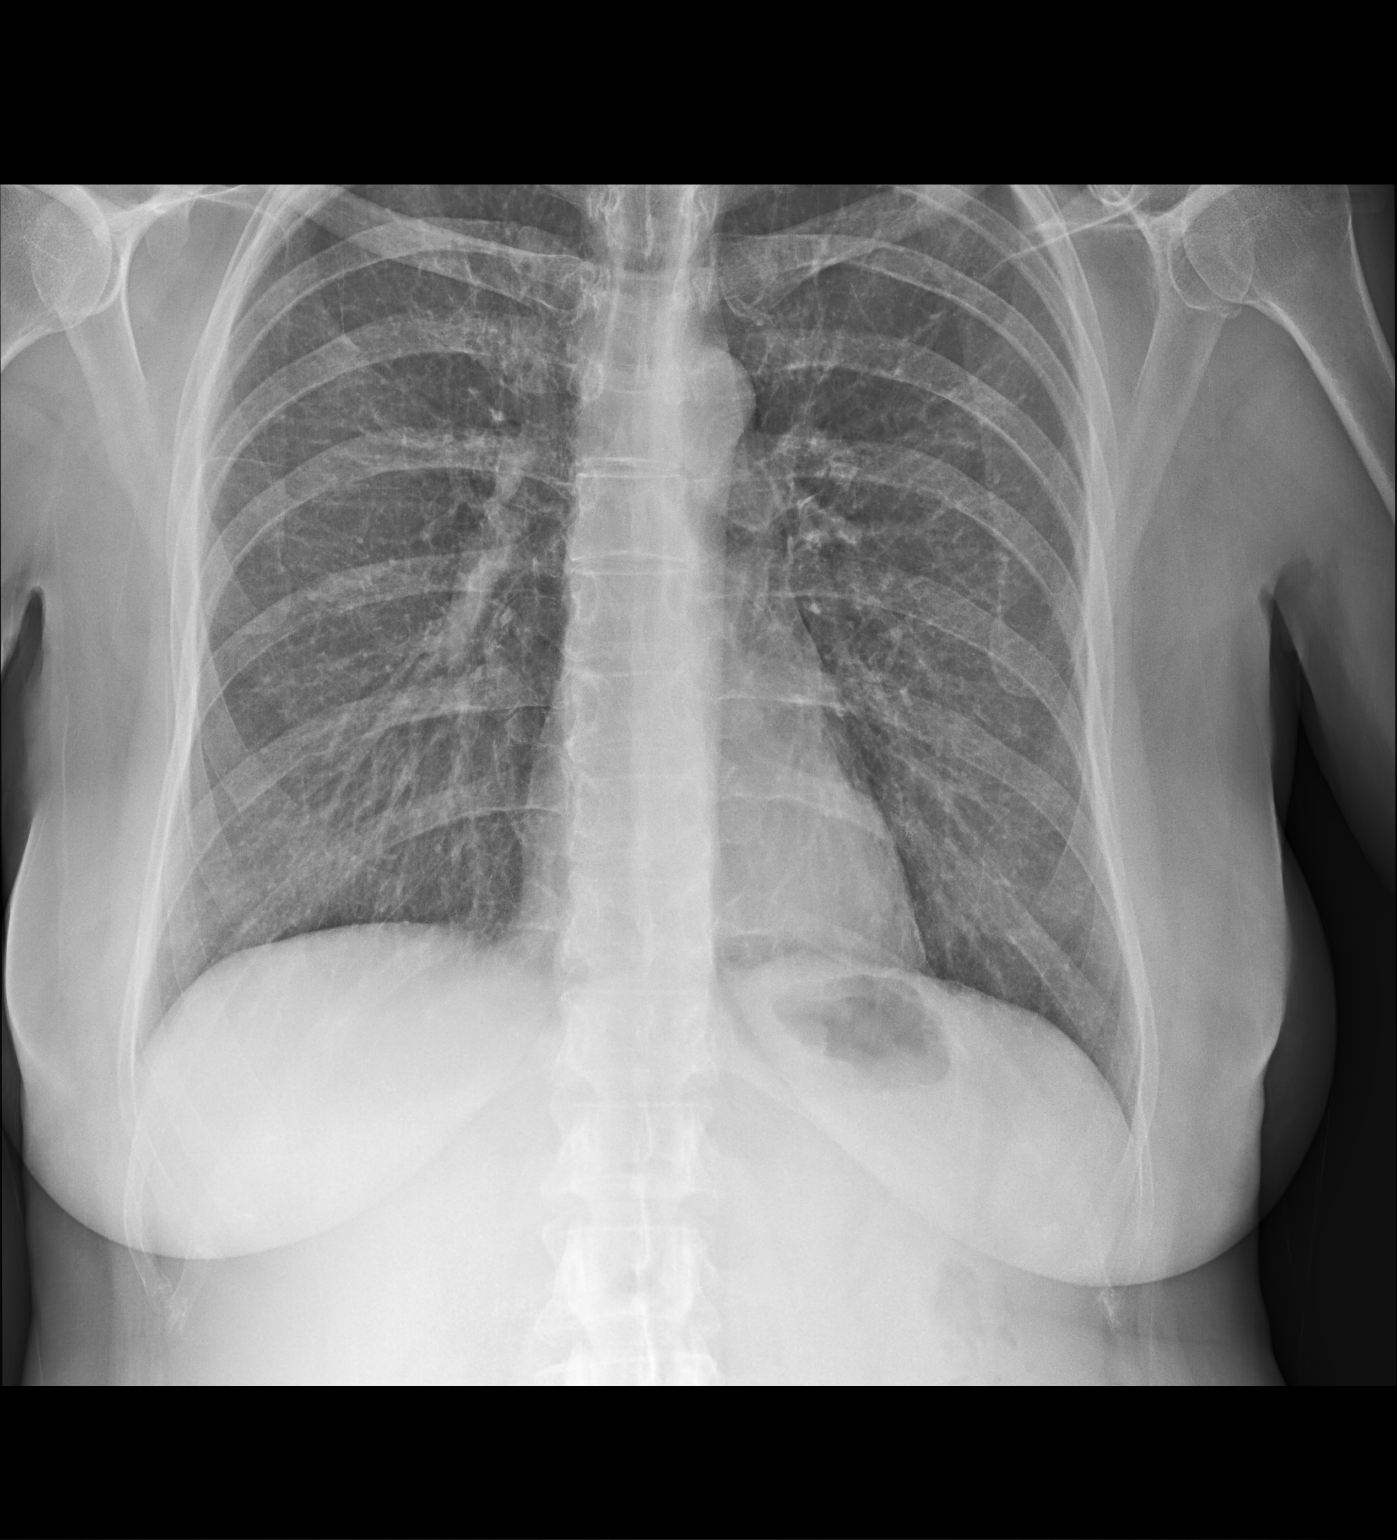

[chest lat]
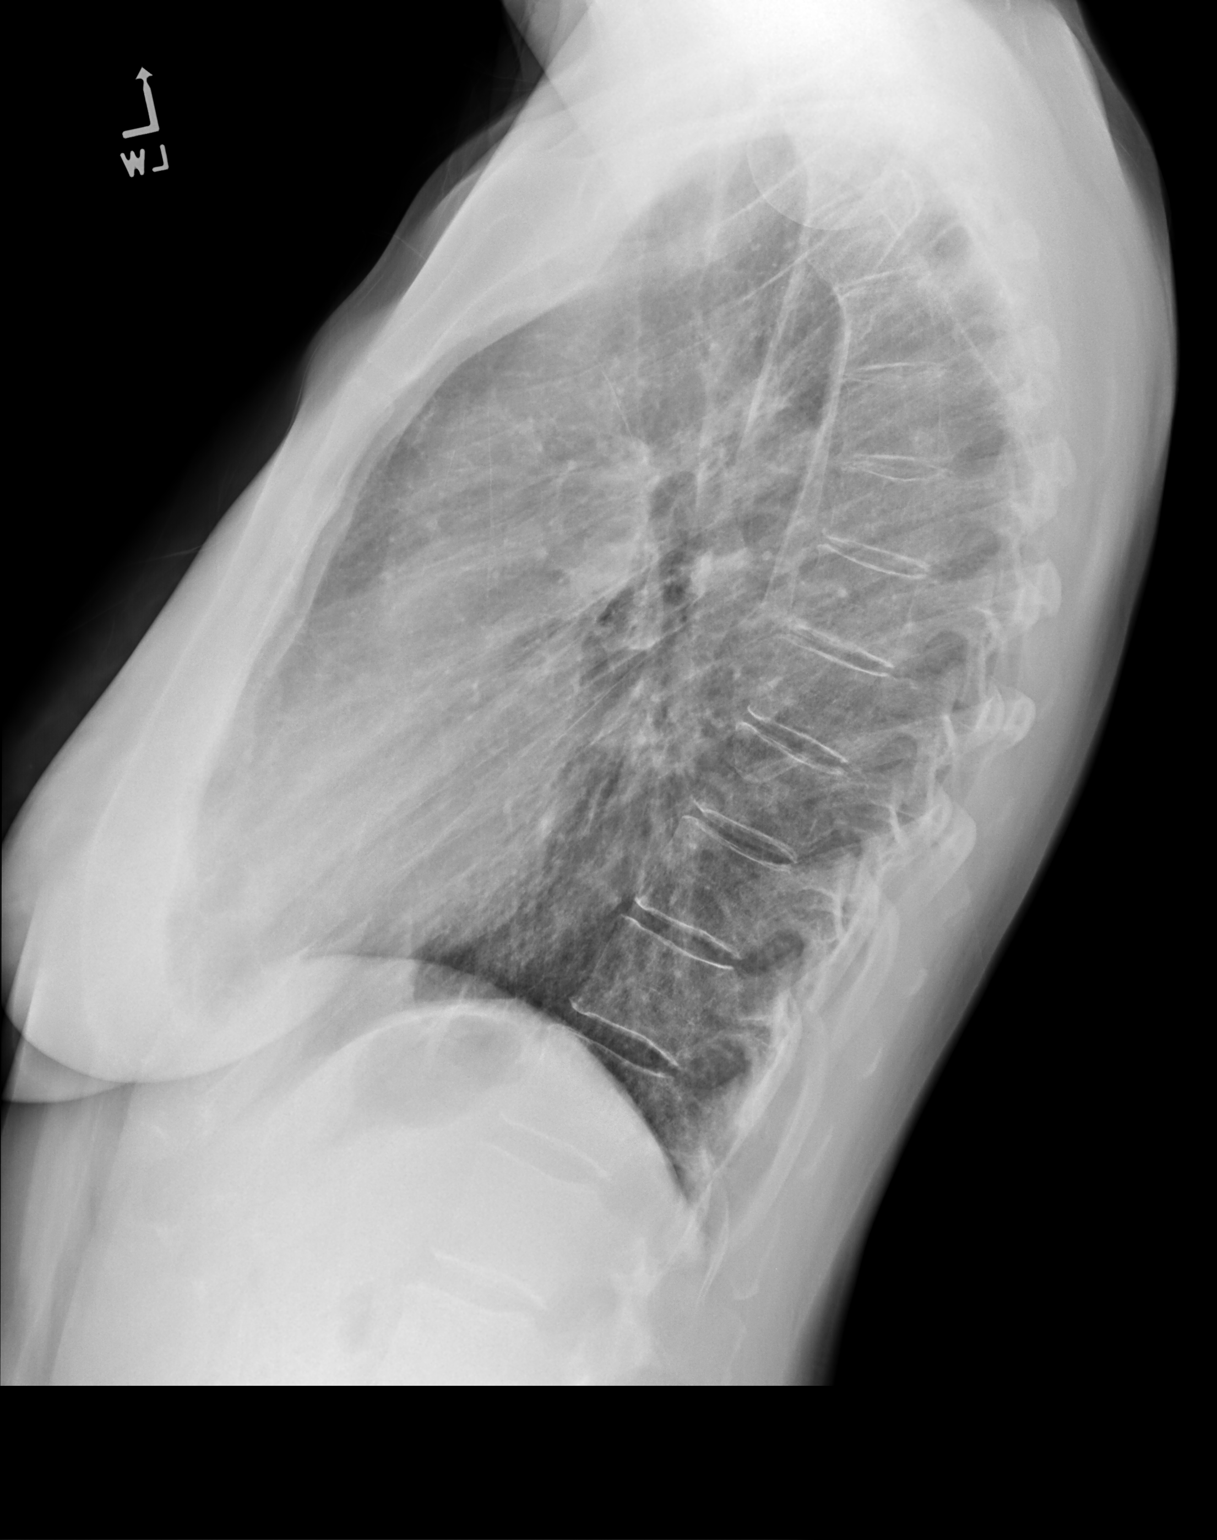

[2 of 2 positions shown; findings below may reference images not displayed]

FINDINGS: The heart size and mediastinal contours are within normal limits.
Both lungs are clear. The visualized skeletal structures are
unremarkable.
IMPRESSION: No active cardiopulmonary disease.
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
## 2021-07-18 NOTE — Progress Notes (Signed)
Doreen Beam, FNP  07/18/2021 7:06 PM EST Back to Top  CMP shows much improved CO2.  Chest x ray still pending.  BUN is however increased, this can happen often with dehydration, high protein diet, gastrointestinal bleeding, be sure no dark stools or bleeding. If not drinking enough increase fluids.  Please add CMP to recheck at her next office viist..  CBC is within normal limits.  Advise office visit or UC if worsening symptoms.

## 2021-07-18 NOTE — Progress Notes (Signed)
CMP shows much improved CO2.  Chest x ray still pending.  BUN is however increased, this can happen often with dehydration, high protein diet, gastrointestinal bleeding, be sure no dark stools or bleeding. If not drinking enough increase fluids.  Please add CMP to recheck at her next office viist..  CBC is within normal limits.  Advise office visit or UC if worsening symptoms.

## 2021-07-18 NOTE — Patient Instructions (Addendum)
Refined awareness of deep core level 1-2 :  Put pillow under hips this week to help prolapse   Stability points,  Pelvic tilt to find neutral ( more anterior instead of posterior) Expand diaphragm not inhale into chest Exhale, notice pelvic floor/ low ab lift, low ab sinks first before upper ab   __  Standing step back lunge:  Use visual line to maintain wide feet under hips Maintain heel alignment and hip forward  Active hands, "like slicing air"not loose fingers Front knee above ankle then step back  Push off ballmounds in back feet

## 2021-07-19 ENCOUNTER — Other Ambulatory Visit: Payer: Self-pay

## 2021-07-19 DIAGNOSIS — R799 Abnormal finding of blood chemistry, unspecified: Secondary | ICD-10-CM

## 2021-07-20 ENCOUNTER — Ambulatory Visit: Payer: Managed Care, Other (non HMO)

## 2021-07-20 ENCOUNTER — Other Ambulatory Visit: Payer: Self-pay

## 2021-07-20 DIAGNOSIS — G5762 Lesion of plantar nerve, left lower limb: Secondary | ICD-10-CM

## 2021-07-20 NOTE — Progress Notes (Signed)
SITUATION Reason for Consult: Evaluation for Bilateral Custom Foot Orthoses Patient / Caregiver Report: Patient is ready for foot orthotics  OBJECTIVE DATA: Patient History / Diagnosis:    ICD-10-CM   1. Morton's neuroma, left  G57.62       Current or Previous Devices: Goodfeet prefabs  Foot Examination: Skin presentation:   Intact Ulcers & Callousing:   None and no history Toe / Foot Deformities:  Prominent met heads Weight Bearing Presentation:  Rectus Sensation:    Intact  ORTHOTIC RECOMMENDATION Recommended Device: 1x pair of custom functional foot orthotics  GOALS OF ORTHOSES - Reduce Pain - Prevent Foot Deformity - Prevent Progression of Further Foot Deformity - Relieve Pressure - Improve the Overall Biomechanical Function of the Foot and Lower Extremity.  ACTIONS PERFORMED Patient was casted for Foot Orthoses via crush box. Procedure was explained and patient tolerated procedure well. All questions were answered and concerns addressed.  PLAN Potential out of pocket cost was communicated to patient. Casts are to be sent to [facility] for fabrication. Patient is to be called for fitting when devices are ready.

## 2021-07-23 ENCOUNTER — Telehealth: Payer: Self-pay

## 2021-07-23 NOTE — Telephone Encounter (Signed)
Mychart msg sent to advise xray normal, follow up if sx persist

## 2021-07-24 ENCOUNTER — Telehealth: Payer: Self-pay

## 2021-07-24 NOTE — Telephone Encounter (Signed)
Casts sent to central fabrication °

## 2021-07-25 ENCOUNTER — Other Ambulatory Visit: Payer: Self-pay

## 2021-07-25 ENCOUNTER — Ambulatory Visit: Payer: Commercial Managed Care - HMO | Attending: Orthopedic Surgery | Admitting: Physical Therapy

## 2021-07-25 DIAGNOSIS — R2689 Other abnormalities of gait and mobility: Secondary | ICD-10-CM | POA: Insufficient documentation

## 2021-07-25 DIAGNOSIS — M533 Sacrococcygeal disorders, not elsewhere classified: Secondary | ICD-10-CM | POA: Diagnosis not present

## 2021-07-25 DIAGNOSIS — M25551 Pain in right hip: Secondary | ICD-10-CM | POA: Diagnosis present

## 2021-07-25 DIAGNOSIS — R102 Pelvic and perineal pain: Secondary | ICD-10-CM | POA: Diagnosis present

## 2021-07-25 DIAGNOSIS — M6208 Separation of muscle (nontraumatic), other site: Secondary | ICD-10-CM | POA: Diagnosis present

## 2021-07-25 DIAGNOSIS — M79672 Pain in left foot: Secondary | ICD-10-CM | POA: Insufficient documentation
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
## 2021-07-25 NOTE — Patient Instructions (Signed)
Staying with facing away from door with  GREEN bands over door  ? ?1) [place] with hands holding band , elbows by side like "W" and not move them   ?GAZE down in pond , chin tuck  ? ?Scoot buttocks back slight, hinge like you are looking at your reflection on a pond  ?Knees behind toes,  ?Inhale to "smell flowers" ? ?Exhale on the rise "like rocket"  ?Do not lock knees, have more weight across ballmounds of feet, toes relaxed  ? ?10 reps x 3 x day  ? ?___ ? ?2)  ?Standing PNF band exercises  ?Stand 45 deg to doorway, front leg is opp of hand holding band  ? ? ? ?"Pulling seat belt"  ? ?Band over door knob with knot on the other side of the door ?Stand with toes, knees, and pelvis turned 45 deg away from the door,  ski track stance, 1st ballmounds down, knee aligned 2-3rd toes.  ?Exhale to pull band from R ear height across body to the L pocket without turning pelvis and knees  ?Follow hand with eyes/head, elbow by ribs, arm is kept at 90 deg forearm to upper arm  ? ? ?15 reps each side  ? ?3) ?Stepping back opp arm exericse: ? ?Open fingers, draw shoulder blades down into socket  ? ?___ ? ? ?Proper body mechanics with getting out of a chair to decrease strain  ?on back &pelvic floor  ? ?Avoid holding your breath when ?Getting out of the chair: ? ?Scoot to front part of chair chair ?Heels behind knees, feet are hip width apart, nose over toes  ?Inhale like you are smelling roses ?Exhale to stand  ? ? ? ? ? ? ? ? ? ? ? ? ? ? ? ?

## 2021-08-01 ENCOUNTER — Other Ambulatory Visit: Payer: Self-pay

## 2021-08-01 ENCOUNTER — Other Ambulatory Visit: Payer: Self-pay | Admitting: Adult Health

## 2021-08-01 ENCOUNTER — Encounter: Payer: Self-pay | Admitting: Adult Health

## 2021-08-01 ENCOUNTER — Ambulatory Visit (INDEPENDENT_AMBULATORY_CARE_PROVIDER_SITE_OTHER): Payer: Managed Care, Other (non HMO) | Admitting: Adult Health

## 2021-08-01 ENCOUNTER — Ambulatory Visit: Payer: Commercial Managed Care - HMO | Admitting: Physical Therapy

## 2021-08-01 VITALS — BP 102/64 | HR 83 | Temp 98.2°F | Resp 14 | Ht 67.76 in | Wt 156.4 lb

## 2021-08-01 DIAGNOSIS — M533 Sacrococcygeal disorders, not elsewhere classified: Secondary | ICD-10-CM | POA: Diagnosis not present

## 2021-08-01 DIAGNOSIS — E785 Hyperlipidemia, unspecified: Secondary | ICD-10-CM

## 2021-08-01 DIAGNOSIS — F419 Anxiety disorder, unspecified: Secondary | ICD-10-CM | POA: Diagnosis not present

## 2021-08-01 DIAGNOSIS — G4700 Insomnia, unspecified: Secondary | ICD-10-CM | POA: Diagnosis not present

## 2021-08-01 DIAGNOSIS — M79672 Pain in left foot: Secondary | ICD-10-CM

## 2021-08-01 DIAGNOSIS — M25551 Pain in right hip: Secondary | ICD-10-CM

## 2021-08-01 DIAGNOSIS — Z8669 Personal history of other diseases of the nervous system and sense organs: Secondary | ICD-10-CM | POA: Diagnosis not present

## 2021-08-01 DIAGNOSIS — R2689 Other abnormalities of gait and mobility: Secondary | ICD-10-CM

## 2021-08-01 DIAGNOSIS — R102 Pelvic and perineal pain: Secondary | ICD-10-CM

## 2021-08-01 DIAGNOSIS — M6208 Separation of muscle (nontraumatic), other site: Secondary | ICD-10-CM

## 2021-08-01 LAB — COMPREHENSIVE METABOLIC PANEL
ALT: 17 U/L (ref 0–35)
AST: 19 U/L (ref 0–37)
Albumin: 4.4 g/dL (ref 3.5–5.2)
Alkaline Phosphatase: 51 U/L (ref 39–117)
BUN: 27 mg/dL — ABNORMAL HIGH (ref 6–23)
CO2: 29 mEq/L (ref 19–32)
Calcium: 9.3 mg/dL (ref 8.4–10.5)
Chloride: 102 mEq/L (ref 96–112)
Creatinine, Ser: 0.93 mg/dL (ref 0.40–1.20)
GFR: 66.68 mL/min (ref >60.00)
Glucose, Bld: 89 mg/dL (ref 70–99)
Potassium: 4.3 mEq/L (ref 3.5–5.1)
Sodium: 139 mEq/L (ref 135–145)
Total Bilirubin: 0.6 mg/dL (ref 0.2–1.2)
Total Protein: 6.8 g/dL (ref 6.0–8.3)

## 2021-08-01 MED ORDER — ALPRAZOLAM 0.5 MG PO TABS: 0.5000 mg | ORAL_TABLET | Freq: Three times a day (TID) | ORAL | 0 refills | Status: DC | PRN

## 2021-08-01 MED ORDER — SUMATRIPTAN SUCCINATE 100 MG PO TABS: ORAL_TABLET | ORAL | 0 refills | Status: DC

## 2021-08-01 MED ORDER — PANTOPRAZOLE SODIUM 40 MG PO TBEC: 40.0000 mg | DELAYED_RELEASE_TABLET | Freq: Every day | ORAL | 2 refills | Status: DC

## 2021-08-01 MED ORDER — ESZOPICLONE 3 MG PO TABS: ORAL_TABLET | ORAL | 0 refills | Status: DC

## 2021-08-01 NOTE — Patient Instructions (Signed)
Health Maintenance, Female °Adopting a healthy lifestyle and getting preventive care are important in promoting health and wellness. Ask your health care provider about: °The right schedule for you to have regular tests and exams. °Things you can do on your own to prevent diseases and keep yourself healthy. °What should I know about diet, weight, and exercise? °Eat a healthy diet ° °Eat a diet that includes plenty of vegetables, fruits, low-fat dairy products, and lean protein. °Do not eat a lot of foods that are high in solid fats, added sugars, or sodium. °Maintain a healthy weight °Body mass index (BMI) is used to identify weight problems. It estimates body fat based on height and weight. Your health care provider can help determine your BMI and help you achieve or maintain a healthy weight. °Get regular exercise °Get regular exercise. This is one of the most important things you can do for your health. Most adults should: °Exercise for at least 150 minutes each week. The exercise should increase your heart rate and make you sweat (moderate-intensity exercise). °Do strengthening exercises at least twice a week. This is in addition to the moderate-intensity exercise. °Spend less time sitting. Even light physical activity can be beneficial. °Watch cholesterol and blood lipids °Have your blood tested for lipids and cholesterol at 61 years of age, then have this test every 5 years. °Have your cholesterol levels checked more often if: °Your lipid or cholesterol levels are high. °You are older than 61 years of age. °You are at high risk for heart disease. °What should I know about cancer screening? °Depending on your health history and family history, you may need to have cancer screening at various ages. This may include screening for: °Breast cancer. °Cervical cancer. °Colorectal cancer. °Skin cancer. °Lung cancer. °What should I know about heart disease, diabetes, and high blood pressure? °Blood pressure and heart  disease °High blood pressure causes heart disease and increases the risk of stroke. This is more likely to develop in people who have high blood pressure readings or are overweight. °Have your blood pressure checked: °Every 3-5 years if you are 18-39 years of age. °Every year if you are 40 years old or older. °Diabetes °Have regular diabetes screenings. This checks your fasting blood sugar level. Have the screening done: °Once every three years after age 40 if you are at a normal weight and have a low risk for diabetes. °More often and at a younger age if you are overweight or have a high risk for diabetes. °What should I know about preventing infection? °Hepatitis B °If you have a higher risk for hepatitis B, you should be screened for this virus. Talk with your health care provider to find out if you are at risk for hepatitis B infection. °Hepatitis C °Testing is recommended for: °Everyone born from 1945 through 1965. °Anyone with known risk factors for hepatitis C. °Sexually transmitted infections (STIs) °Get screened for STIs, including gonorrhea and chlamydia, if: °You are sexually active and are younger than 61 years of age. °You are older than 61 years of age and your health care provider tells you that you are at risk for this type of infection. °Your sexual activity has changed since you were last screened, and you are at increased risk for chlamydia or gonorrhea. Ask your health care provider if you are at risk. °Ask your health care provider about whether you are at high risk for HIV. Your health care provider may recommend a prescription medicine to help prevent HIV   infection. If you choose to take medicine to prevent HIV, you should first get tested for HIV. You should then be tested every 3 months for as long as you are taking the medicine. Pregnancy If you are about to stop having your period (premenopausal) and you may become pregnant, seek counseling before you get pregnant. Take 400 to 800  micrograms (mcg) of folic acid every day if you become pregnant. Ask for birth control (contraception) if you want to prevent pregnancy. Osteoporosis and menopause Osteoporosis is a disease in which the bones lose minerals and strength with aging. This can result in bone fractures. If you are 45 years old or older, or if you are at risk for osteoporosis and fractures, ask your health care provider if you should: Be screened for bone loss. Take a calcium or vitamin D supplement to lower your risk of fractures. Be given hormone replacement therapy (HRT) to treat symptoms of menopause. Follow these instructions at home: Alcohol use Do not drink alcohol if: Your health care provider tells you not to drink. You are pregnant, may be pregnant, or are planning to become pregnant. If you drink alcohol: Limit how much you have to: 0-1 drink a day. Know how much alcohol is in your drink. In the [place] one drink equals one 12 oz bottle of beer (355 mL), one 5 oz glass of wine (148 mL), or one 1 oz glass of hard liquor (44 mL). Lifestyle Do not use any products that contain nicotine or tobacco. These products include cigarettes, chewing tobacco, and vaping devices, such as e-cigarettes. If you need help quitting, ask your health care provider. Do not use street drugs. Do not share needles. Ask your health care provider for help if you need support or information about quitting drugs. General instructions Schedule regular health, dental, and eye exams. Stay current with your vaccines. Tell your health care provider if: You often feel depressed. You have ever been abused or do not feel safe at home. Summary Adopting a healthy lifestyle and getting preventive care are important in promoting health and wellness. Follow your health care provider's instructions about healthy diet, exercising, and getting tested or screened for diseases. Follow your health care provider's instructions on monitoring your  cholesterol and blood pressure. This information is not intended to replace advice given to you by your health care provider. Make sure you discuss any questions you have with your health care provider. Document Revised: 10/02/2020 Document Reviewed: 10/02/2020 Elsevier Patient Education  2022 [place]. Diverticulosis Diverticulosis is a condition that develops when small pouches (diverticula) form in the wall of the large intestine (colon). The colon is where water is absorbed and stool (feces) is formed. The pouches form when the inside layer of the colon pushes through weak spots in the outer layers of the colon. You may have a few pouches or many of them. The pouches usually do not cause problems unless they become inflamed or infected. When this happens, the condition is called diverticulitis. What are the causes? The cause of this condition is not known. What increases the risk? The following factors may make you more likely to develop this condition: Being older than age 30. Your risk for this condition increases with age. Diverticulosis is rare among people younger than age 79. By age 44, many people have it. Eating a low-fiber diet. Having frequent constipation. Being overweight. Not getting enough exercise. Smoking. Taking over-the-counter pain medicines, like aspirin and ibuprofen. Having a family history of diverticulosis.  What are the signs or symptoms? In most people, there are no symptoms of this condition. If you do have symptoms, they may include: Bloating. Cramps in the abdomen. Constipation or diarrhea. Pain in the lower left side of the abdomen. How is this diagnosed? Because diverticulosis usually has no symptoms, it is most often diagnosed during an exam for other colon problems. The condition may be diagnosed by: Using a flexible scope to examine the colon (colonoscopy). Taking an X-ray of the colon after dye has been put into the colon (barium enema). Having  a CT scan. How is this treated? You may not need treatment for this condition. Your health care provider may recommend treatment to prevent problems. You may need treatment if you have symptoms or if you previously had diverticulitis. Treatment may include: Eating a high-fiber diet. Taking a fiber supplement. Taking a live bacteria supplement (probiotic). Taking medicine to relax your colon. Follow these instructions at home: Medicines Take over-the-counter and prescription medicines only as told by your health care provider. If told by your health care provider, take a fiber supplement or probiotic. Constipation prevention Your condition may cause constipation. To prevent or treat constipation, you may need to: Drink enough fluid to keep your urine pale yellow. Take over-the-counter or prescription medicines. Eat foods that are high in fiber, such as beans, whole grains, and fresh fruits and vegetables. Limit foods that are high in fat and processed sugars, such as fried or sweet foods.  General instructions Try not to strain when you have a bowel movement. Keep all follow-up visits as told by your health care provider. This is important. Contact a health care provider if you: Have pain in your abdomen. Have bloating. Have cramps. Have not had a bowel movement in 3 days. Get help right away if: Your pain gets worse. Your bloating becomes very bad. You have a fever or chills, and your symptoms suddenly get worse. You vomit. You have bowel movements that are bloody or black. You have bleeding from your rectum. Summary Diverticulosis is a condition that develops when small pouches (diverticula) form in the wall of the large intestine (colon). You may have a few pouches or many of them. This condition is most often diagnosed during an exam for other colon problems. Treatment may include increasing the fiber in your diet, taking supplements, or taking medicines. This information is  not intended to replace advice given to you by your health care provider. Make sure you discuss any questions you have with your health care provider. Document Revised: 12/10/2018 Document Reviewed: 12/10/2018 Elsevier Patient Education  [place].

## 2021-08-01 NOTE — Patient Instructions (Signed)
Translate posture awareness in a chair to walking ? ?__ ? ? ?Walking with chin tuck , shoulder down, slight lean of COM over ballmounds , long streides, push off with ballmounds like a paddle in the water and lift up knees like walking in dry sand or like on a bike  ?

## 2021-08-01 NOTE — Progress Notes (Signed)
Acute Office Visit  Subjective:    Patient ID: Lisa Gonzalez, female    DOB: [birth year], 61 y.o.   MRN: [id number]  Chief Complaint  Patient presents with   Follow-up    1 wk, discuss labs/x-ray     HPI Patient is in today for cough that has resolved since x ray.    BUN was elevated 2 weeks ago. She had been doing increased protein diet. She has decreased this.   She  has tried pantoprazole of her husbands and has done well with it. She would like a script.   Colonoscopy 2022. Endoscopy 2018 was normal per patient.  Denies any dark stools or blood stools.    Request refills on lunesta, xanax - is aware not to take together.  Request refill on Imitrex as well, denies any new changes or headache features with her migraines. Denies any new frequency or intensity.   She did lemon and garlic heart cleanse and she reports she had cholesterol decreased some with this. She started statin for a week and stopped. She worries due to muscle pain and weakness with her mom. She prefers to readdress the statin at a later time.    Chest x ray has been resulted and reviewed previously with patient.  EXAM: CHEST - 2 VIEW COMPARISON:  08/18/2019 FINDINGS: The heart size and mediastinal contours are within normal limits. Both lungs are clear. The visualized skeletal structures are unremarkable. IMPRESSION: No active cardiopulmonary disease. Electronically Signed   By: Fidela Salisbury M.D.   On: 07/20/2021 01:27   Patient  denies any fever, body aches,chills, rash, chest pain, shortness of breath, nausea, vomiting, or diarrhea.  Denies dizziness, lightheadedness, pre syncopal or syncopal episodes.    Past Medical History:  Diagnosis Date   Allergy    Anxiety    Back pain    Cyst of left breast    GERD (gastroesophageal reflux disease)    History of endometriosis    History of ovarian cyst    PTSD (post-traumatic stress disorder)     Past Surgical History:  Procedure  Laterality Date   COLONOSCOPY WITH PROPOFOL N/A 03/21/2021   Procedure: COLONOSCOPY WITH PROPOFOL;  Surgeon: Virgel Manifold, MD;  Location: [facility] ENDOSCOPY;  Service: Endoscopy;  Laterality: N/A;   LAPAROSCOPIC VAGINAL HYSTERECTOMY     SPINAL FUSION  2018   SPINE SURGERY      Family History  Problem Relation Age of Onset   Autoimmune disease Mother    Heart attack Father 36   Cancer - Colon Maternal Grandmother    Alzheimer's disease Maternal Grandmother    Heart attack Maternal Grandfather    Bone cancer Paternal Grandmother     Social History   Socioeconomic History   Marital status: Married    Spouse name: Not on file   Number of children: Not on file   Years of education: Not on file   Highest education level: Not on file  Occupational History   Not on file  Tobacco Use   Smoking status: Never   Smokeless tobacco: Never  Vaping Use   Vaping Use: Never used  Substance and Sexual Activity   Alcohol use: Yes    Alcohol/week: 1.0 standard drink    Types: 1 Glasses of wine per week   Drug use: Never   Sexual activity: Yes    Birth control/protection: None, Surgical    Comment: Hysterectomy  Other Topics Concern   Not on file  Social  Narrative  ° Not on file  ° °Social Determinants of Health  ° °Financial Resource Strain: Not on file  °Food Insecurity: Not on file  °Transportation Needs: Not on file  °Physical Activity: Not on file  °Stress: Not on file  °Social Connections: Not on file  °Intimate Partner Violence: Not on file  ° ° °Outpatient Medications Prior to Visit  °Medication Sig Dispense Refill  ° estradiol (VIVELLE-DOT) 0.075 MG/24HR Place 1 patch onto the skin 2 (two) times a week. 8 patch 0  ° loratadine (CLARITIN) 10 MG tablet Take 10 mg by mouth daily as needed.    ° methocarbamol (ROBAXIN) 500 MG tablet Take 1 tablet (500 mg total) by mouth 4 (four) times daily. (Patient taking differently: Take 500 mg by mouth every 8 (eight) hours as needed.) 90  tablet 3  ° omeprazole (PRILOSEC) 40 MG capsule Take 40 mg by mouth 2 (two) times daily as needed.    ° rosuvastatin (CRESTOR) 5 MG tablet Take 1 tablet (5 mg total) by mouth daily. 90 tablet 3  ° ALPRAZolam (XANAX) 0.5 MG tablet Take 1 tablet (0.5 mg total) by mouth 3 (three) times daily as needed for anxiety (not to take with lunesta). 90 tablet 0  ° Eszopiclone 3 MG TABS TAKE 1 TAB AT BEDTIME AS NEEDED FOR SLEEP. MAY TRY 1/2 TAB 1ST TO SEE EFFECTIVE BEFORE FULL TABLET. 30 tablet 0  ° SUMAtriptan (IMITREX) 100 MG tablet Take one  tablet for migraine as needed. May repeat in 2 hours if headache persists or recurs.Maximum dose in 200 mg per day. 10 tablet 0  ° clotrimazole-betamethasone (LOTRISONE) cream Apply externally BID for 2 wks 15 g 0  ° minoxidil (LONITEN) 2.5 MG tablet Take by mouth. (Patient not taking: Reported on 08/01/2021)    ° methylPREDNISolone (MEDROL DOSEPAK) 4 MG TBPK tablet 6 day dose pack - take as directed (Patient not taking: Reported on 08/01/2021) 21 tablet 0  ° °Facility-Administered Medications Prior to Visit  °Medication Dose Route Frequency Provider Last Rate Last Admin  ° betamethasone acetate-betamethasone sodium phosphate (CELESTONE) injection 3 mg  3 mg Intra-articular Once Evans, Brent M, DPM      ° ° °Allergies  °Allergen Reactions  ° Meperidine Swelling and Nausea Only  °  Blood Pressure Dropped °Low BP and pass out. °  ° Hydrocodone Nausea And Vomiting, Swelling and Nausea Only  °  Hot and cold chills, vomiting, headache, and chest pain ° °Other reaction(s): Vomiting °Hot and cold chills, headache, chest pain  ° Hydromorphone Nausea And Vomiting and Nausea Only  °  Other reaction(s): Vomiting  ° Opium Other (See Comments)  °  Hot and cold chills, vomiting, headache and chest pain  ° Oxycodone Nausea And Vomiting and Nausea Only  °  Other reaction(s): Other (comments) °Hot and cold chills, headache, vomiting, and chest pain. °  ° ° °Review of Systems  °Constitutional: Negative.    °HENT: Negative.    °Respiratory: Negative.    °Cardiovascular: Negative.   °Gastrointestinal: Negative.   °     GERD at times has had reflux for years depending on her diet. Wants to try pantoprazole. Endoscopy within normal 2018 per patient. Denies hemoptysis, black or tarry stools, no rectal bleeding.  °Will  follow up with gastrointestinal MD if not resolved or if persistent at anytime.   °Genitourinary: Negative.   °Musculoskeletal: Negative.   °Skin: Negative.   °Psychiatric/Behavioral:  Positive for sleep disturbance (takes 3 mg lunesta sleeps 7   hours, no refill since december uses as needed.). Negative for agitation, behavioral problems, confusion, decreased concentration, dysphoric mood, hallucinations, self-injury and suicidal ideas. The patient is nervous/anxious (at times, uses xanax only as needed. no refill since december 2020.). The patient is not hyperactive.   ° °   °Objective:  °  °Physical Exam ° °General: Appearance:    Well developed, well nourished female in no acute distress  °Eyes:    PERRL, conjunctiva/corneas clear, external ear lobes within normal limits.      °Lungs:     Clear to auscultation bilaterally, respirations unlabored  °Heart:    Normal heart rate. Normal rhythm. No murmurs, rubs, or gallops.  °  °MS:   All extremities are intact.  °  °Neurologic:   Awake, alert, oriented x 3. No apparent focal neurological           defect.   °  °BP 102/64 (BP Location: Left Arm, Patient Position: Sitting, Cuff Size: Small)    Pulse 83    Temp 98.2 °F (36.8 °C) (Oral)    Resp 14    Ht 5' 7.76" (1.721 m)    Wt 156 lb 6.4 oz (70.9 kg)    SpO2 98%    BMI 23.95 kg/m²  °Wt Readings from Last 3 Encounters:  °08/01/21 156 lb 6.4 oz (70.9 kg)  °05/30/21 158 lb 9.6 oz (71.9 kg)  °05/10/21 153 lb (69.4 kg)  ° ° °Health Maintenance Due  °Topic Date Due  ° Hepatitis C Screening  Never done  ° TETANUS/TDAP  Never done  ° Zoster Vaccines- Shingrix (1 of 2) Never done  ° INFLUENZA VACCINE  Never done   ° ° °There are no preventive care reminders to display for this patient. ° ° °Lab Results  °Component Value Date  ° TSH 2.08 05/18/2021  ° °Lab Results  °Component Value Date  ° WBC 8.5 07/18/2021  ° HGB 13.1 07/18/2021  ° HCT 38.9 07/18/2021  ° MCV 90.8 07/18/2021  ° PLT 249.0 07/18/2021  ° °Lab Results  °Component Value Date  ° NA 137 07/18/2021  ° K 4.3 07/18/2021  ° CO2 33 (H) 07/18/2021  ° GLUCOSE 91 07/18/2021  ° BUN 33 (H) 07/18/2021  ° CREATININE 0.89 07/18/2021  ° BILITOT 0.4 07/18/2021  ° ALKPHOS 52 07/18/2021  ° AST 17 07/18/2021  ° ALT 14 07/18/2021  ° PROT 7.0 07/18/2021  ° ALBUMIN 4.4 07/18/2021  ° CALCIUM 9.8 07/18/2021  ° EGFR 66 09/22/2020  ° GFR 70.31 07/18/2021  ° °Lab Results  °Component Value Date  ° CHOL 199 07/13/2021  ° °Lab Results  °Component Value Date  ° HDL 49.80 07/13/2021  ° °Lab Results  °Component Value Date  ° LDLCALC 134 (H) 07/13/2021  ° °Lab Results  °Component Value Date  ° TRIG 77.0 07/13/2021  ° °Lab Results  °Component Value Date  ° CHOLHDL 4 07/13/2021  ° °Lab Results  °Component Value Date  ° HGBA1C 5.6 08/18/2019  ° ° °   °Assessment & Plan:  ° °Problem List Items Addressed This Visit   ° °  ° Other  ° Anxiety  ° Relevant Medications  ° ALPRAZolam (XANAX) 0.5 MG tablet  ° Other Relevant Orders  ° Comprehensive metabolic panel  ° History of migraine  ° Relevant Medications  ° SUMAtriptan (IMITREX) 100 MG tablet  ° Hyperlipidemia - Primary  ° Relevant Medications  °  Insomnia  ° Relevant Medications  ° Eszopiclone 3 MG TABS  ° °  Orders Placed This Encounter  Procedures   Comprehensive metabolic panel     Meds ordered this encounter  Medications   Eszopiclone 3 MG TABS    Sig: TAKE 1 TAB AT BEDTIME AS NEEDED FOR SLEEP. MAY TRY 1/2 TAB 1ST TO SEE EFFECTIVE BEFORE FULL TABLET.    Dispense:  30 tablet    Refill:  0    This request is for a new prescription for a controlled substance as required by Federal/State law..   SUMAtriptan (IMITREX) 100 MG tablet    Sig:  Take one  tablet for migraine as needed. May repeat in 2 hours if headache persists or recurs.Maximum dose in 200 mg per day.    Dispense:  10 tablet    Refill:  0   ALPRAZolam (XANAX) 0.5 MG tablet    Sig: Take 1 tablet (0.5 mg total) by mouth 3 (three) times daily as needed for anxiety (not to take with lunesta).    Dispense:  90 tablet    Refill:  0   pantoprazole (PROTONIX) 40 MG tablet    Sig: Take 1 tablet (40 mg total) by mouth daily.    Dispense:  90 tablet    Refill:  2   BNP was mildly elevated 2 weeks ago will recheck today.  Recommend gastroenterology if any symptoms of reflux persist or worsen at anytime. Patient verbalized understanding of all instructions given and denies any further questions at this time.   She prefers not to take statin at this time, she is making good strides with her diet and lifestyle as reflected in changed cholesterol levels 2 weeks ago, LDL still elevated. She did  have covid as well.  Cough has resolved mostly at this time, call for follow up if persists or returns at anytime. Chest x ray was reassuring.   Red Flags discussed. The patient was given clear instructions to go to ER or return to medical center if any red flags develop, symptoms do not improve, worsen or new problems develop. They verbalized understanding.   Patient is aware that his primary care provider is leaving the office and last day will be August 16, 2021 and he will need to establish care with a new primary care provider, list is available upfront for patient to help him with establishing care or they can choose a provider of their choice.Patient verbalized understanding of all instructions given and denies any further questions at this time.     The patient is advised to follow a low fat, low cholesterol diet, reduce salt in diet and cooking, reduce exposure to stress, improve dietary compliance, use calcium 1 gram daily with Vit D, continue current medications, continue current  healthy lifestyle patterns, and return for routine annual checkups.  Return in about 3 months (around 11/01/2021), or if symptoms worsen or fail to improve, for at any time for any worsening symptoms, Go to Emergency room/ urgent care if worse.   Marcille Buffy, FNP

## 2021-08-01 NOTE — Progress Notes (Signed)
CMP - shows BUN is trending down, still elevated.  ?She should be sure she is hydrated.  ?Monitor for any blood in stools, dark stool. Endoscopy may need to be considered with GI.  ?Given her family history of kidney disease, I can order [place] of kidneys.  ?Recommend rechecking this lab 08/13/21- CMP.
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
## 2021-08-08 ENCOUNTER — Ambulatory Visit: Payer: Commercial Managed Care - HMO | Admitting: Physical Therapy

## 2021-08-08 ENCOUNTER — Other Ambulatory Visit: Payer: Self-pay

## 2021-08-08 DIAGNOSIS — M25551 Pain in right hip: Secondary | ICD-10-CM

## 2021-08-08 DIAGNOSIS — M533 Sacrococcygeal disorders, not elsewhere classified: Secondary | ICD-10-CM | POA: Diagnosis not present

## 2021-08-08 DIAGNOSIS — R102 Pelvic and perineal pain: Secondary | ICD-10-CM

## 2021-08-08 DIAGNOSIS — M6208 Separation of muscle (nontraumatic), other site: Secondary | ICD-10-CM

## 2021-08-08 DIAGNOSIS — R2689 Other abnormalities of gait and mobility: Secondary | ICD-10-CM

## 2021-08-08 DIAGNOSIS — M79672 Pain in left foot: Secondary | ICD-10-CM
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
## 2021-08-08 NOTE — Patient Instructions (Signed)
Four exercises: ?  ? Feet slides ? ? Band at waist walking ? ? Deep core level 1-2 ( pillow under hips)  ? ? Backward lunges, opp arm  ?__ ? ?Happy baby pose ?

## 2021-08-14 ENCOUNTER — Telehealth: Payer: Self-pay | Admitting: Podiatry

## 2021-08-14 NOTE — Telephone Encounter (Signed)
Lvm for patient to call and schedule an appointment with our office for orthotic pick up ?

## 2021-08-15 ENCOUNTER — Other Ambulatory Visit: Payer: Self-pay

## 2021-08-15 ENCOUNTER — Ambulatory Visit: Payer: Commercial Managed Care - HMO | Admitting: Physical Therapy

## 2021-08-15 ENCOUNTER — Encounter: Payer: Self-pay | Admitting: Adult Health

## 2021-08-15 DIAGNOSIS — M6208 Separation of muscle (nontraumatic), other site: Secondary | ICD-10-CM

## 2021-08-15 DIAGNOSIS — M25551 Pain in right hip: Secondary | ICD-10-CM

## 2021-08-15 DIAGNOSIS — M533 Sacrococcygeal disorders, not elsewhere classified: Secondary | ICD-10-CM

## 2021-08-15 DIAGNOSIS — R2689 Other abnormalities of gait and mobility: Secondary | ICD-10-CM

## 2021-08-15 DIAGNOSIS — M79672 Pain in left foot: Secondary | ICD-10-CM

## 2021-08-15 DIAGNOSIS — R102 Pelvic and perineal pain: Secondary | ICD-10-CM
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
## 2021-08-20 ENCOUNTER — Ambulatory Visit: Payer: 59 | Admitting: Dermatology

## 2021-08-20 ENCOUNTER — Other Ambulatory Visit: Payer: Self-pay

## 2021-08-20 DIAGNOSIS — Z0000 Encounter for general adult medical examination without abnormal findings: Secondary | ICD-10-CM

## 2021-08-21 ENCOUNTER — Encounter: Payer: 59 | Admitting: Physical Therapy

## 2021-08-22 ENCOUNTER — Other Ambulatory Visit: Payer: Self-pay

## 2021-08-22 ENCOUNTER — Ambulatory Visit (INDEPENDENT_AMBULATORY_CARE_PROVIDER_SITE_OTHER): Payer: Managed Care, Other (non HMO) | Admitting: Dermatology

## 2021-08-22 DIAGNOSIS — L814 Other melanin hyperpigmentation: Secondary | ICD-10-CM | POA: Diagnosis not present

## 2021-08-22 DIAGNOSIS — L719 Rosacea, unspecified: Secondary | ICD-10-CM | POA: Diagnosis not present

## 2021-08-22 DIAGNOSIS — L578 Other skin changes due to chronic exposure to nonionizing radiation: Secondary | ICD-10-CM

## 2021-08-22 DIAGNOSIS — L650 Telogen effluvium: Secondary | ICD-10-CM | POA: Diagnosis not present

## 2021-08-22 DIAGNOSIS — L820 Inflamed seborrheic keratosis: Secondary | ICD-10-CM | POA: Diagnosis not present

## 2021-08-22 DIAGNOSIS — L821 Other seborrheic keratosis: Secondary | ICD-10-CM

## 2021-08-22 DIAGNOSIS — Z1283 Encounter for screening for malignant neoplasm of skin: Secondary | ICD-10-CM | POA: Diagnosis not present

## 2021-08-22 DIAGNOSIS — D229 Melanocytic nevi, unspecified: Secondary | ICD-10-CM

## 2021-08-22 DIAGNOSIS — Z8616 Personal history of COVID-19: Secondary | ICD-10-CM

## 2021-08-22 DIAGNOSIS — D1800 Hemangioma unspecified site: Secondary | ICD-10-CM

## 2021-08-22 NOTE — Patient Instructions (Addendum)
Telogen Effluvium ? ?Telogen effluvium is a benign, self-limited condition causing increased hair shedding usually for several months. It does not progress to baldness, and the hair eventually grows back on its own. It can be triggered by recent illness, recent surgery, thyroid disease, low iron stores, vitamin D deficiency, fad diets or rapid weight loss, hormonal changes such as pregnancy or birth control pills, and some medication. Usually the hair loss starts 2-3 months after the illness or health change. Rarely, it can continue for longer than a year.  ? ?Start Minoxidil 2.'5mg'$  take 1/2 a pill daily ?Start Biotin 2.'5mg'$  1 a day ? ? ?Instructions for [facility] Medications ? ?One or more of your medications was sent to the [facility] mail order compounding pharmacy. You will receive an email from them and can purchase the medicine through that link. It will then be mailed to your home at the address you confirmed. If for any reason you do not receive an email from them, please check your spam folder. If you still do not find the email, please let us know. [facility] phone number is [phone].  ? ? ?Cryotherapy Aftercare ? ?Wash gently with soap and water everyday.   ?Apply Vaseline and Band-Aid daily until healed.  ? ?If You Need Anything After Your Visit ? ?If you have any questions or concerns for your doctor, please call our main line at [phone] and press option 4 to reach your doctor's medical assistant. If no one answers, please leave a voicemail as directed and we will return your call as soon as possible. Messages left after 4 pm will be answered the following business day.  ? ?You may also send [place] a message via MyChart. We typically respond to MyChart messages within 1-2 business days. ? ?For prescription refills, please ask your pharmacy to contact our office. Our fax number is [phone]. ? ?If you have an urgent issue when the clinic is closed that cannot wait until the next  business day, you can page your doctor at the number below.   ? ?Please note that while we do our best to be available for urgent issues outside of office hours, we are not available 24/7.  ? ?If you have an urgent issue and are unable to reach [place], you may choose to seek medical care at your doctor's office, retail clinic, urgent care center, or emergency room. ? ?If you have a medical emergency, please immediately call 911 or go to the emergency department. ? ?Pager Numbers ? ?- Dr. Nehemiah Massed: [phone] ? ?- Dr. Laurence Ferrari: [phone] ? ?- Dr. Nicole Kindred: [phone] ? ?In the event of inclement weather, please call our main line at [phone] for an update on the status of any delays or closures. ? ?Dermatology Medication Tips: ?Please keep the boxes that topical medications come in in order to help keep track of the instructions about where and how to use these. Pharmacies typically print the medication instructions only on the boxes and not directly on the medication tubes.  ? ?If your medication is too expensive, please contact our office at [phone] option 4 or send [place] a message through [place].  ? ?We are unable to tell what your co-pay for medications will be in advance as this is different depending on your insurance coverage. However, we may be able to find a substitute medication at lower cost or fill out paperwork to get insurance to cover a needed medication.  ? ?If a prior authorization is required to get  your medication covered by your insurance company, please allow [place] 1-2 business days to complete this process. ? ?Drug prices often vary depending on where the prescription is filled and some pharmacies may offer cheaper prices. ? ?The website www.goodrx.com contains coupons for medications through different pharmacies. The prices here do not account for what the cost may be with help from insurance (it may be cheaper with your insurance), but the website can give you the price if you did not use  any insurance.  ?- You can print the associated coupon and take it with your prescription to the pharmacy.  ?- You may also stop by our office during regular business hours and pick up a GoodRx coupon card.  ?- If you need your prescription sent electronically to a different pharmacy, notify our office through [facility] or by phone at [phone] option 4. ? ? ? ? ?Si Usted Necesita Algo Despu?s de Su Visita ? ?Tambi?n puede enviarnos un mensaje a trav?s de MyChart. Por lo general respondemos a los mensajes de MyChart en el transcurso de 1 a 2 d?as h?biles. ? ?Para renovar recetas, por favor pida a su farmacia que se ponga en contacto con nuestra oficina. Nuestro n?mero de fax es el [phone]. ? ?Si tiene un asunto urgente cuando la cl?nica est? cerrada y que no puede esperar hasta el siguiente d?a h?bil, puede llamar/localizar a su doctor(a) al n?mero que aparece a continuaci?n.  ? ?Por favor, tenga en cuenta que aunque hacemos todo lo posible para estar disponibles para asuntos urgentes fuera del horario de oficina, no estamos disponibles las 24 horas del d?a, los 7 d?as de la semana.  ? ?Si tiene un problema urgente y no puede comunicarse con nosotros, puede optar por buscar atenci?n m?dica  en el consultorio de su doctor(a), en una cl?nica privada, en un centro de atenci?n urgente o en una sala de emergencias. ? ?Si tiene Engineer, maintenance (IT) m?dica, por favor llame inmediatamente al 911 o vaya a la sala de emergencias. ? ?N?meros de b?per ? ?- Dr. Nehemiah Massed: [phone] ? ?- Dra. Moye: [phone] ? ?- Dra. Nicole Kindred: [phone] ? ?En caso de inclemencias del tiempo, por favor llame a nuestra l?nea principal al [phone] para una actualizaci?n sobre el estado de cualquier retraso o cierre. ? ?Consejos para la medicaci?n en dermatolog?a: ?Por favor, guarde las cajas en las que vienen los medicamentos de uso t?pico para ayudarle a seguir las instrucciones sobre d?nde y c?mo usarlos. Las farmacias  generalmente imprimen las instrucciones del medicamento s?lo en las cajas y no directamente en los tubos del [place].  ? ?Si su medicamento es muy caro, por favor, p?ngase en contacto con Zigmund Daniel llamando al [phone] y presione la opci?n 4 o env?enos un mensaje a trav?s de MyChart.  ? ?No podemos decirle cu?l ser? su copago por los medicamentos por adelantado ya que esto es diferente dependiendo de la cobertura de su seguro. Sin embargo, es posible que podamos encontrar un medicamento sustituto a Electrical engineer un formulario para que el seguro cubra el medicamento que se considera necesario.  ? ?Si se requiere Ardelia Mems autorizaci?n previa para que su compa??a de seguros [place] su medicamento, por favor perm?tanos de 1 a 2 d?as h?biles para completar este proceso. ? ?Los precios de los medicamentos var?an con frecuencia dependiendo del Environmental consultant de d?nde se surte la receta y alguna farmacias pueden ofrecer precios m?s baratos. ? ?El sitio web www.goodrx.com tiene cupones para medicamentos de Airline pilot. Los  precios aqu? no tienen en cuenta lo que podr?a costar con la ayuda del seguro (puede ser m?s barato con su seguro), pero el sitio web puede darle el precio si no utiliz? ning?n seguro.  ?- Puede imprimir el cup?n correspondiente y llevarlo con su receta a la farmacia.  ?- Tambi?n puede pasar por nuestra oficina durante el horario de atenci?n regular y recoger una tarjeta de cupones de GoodRx.  ?- Si necesita que su receta se env?e electr?nicamente a Chiropodist, informe a nuestra oficina a trav?s de MyChart de [place] o por tel?fono llamando al [phone] y presione la opci?n 4.  ?

## 2021-08-22 NOTE — Progress Notes (Signed)
? ?New Patient Visit ? ?Subjective  ?Lisa Gonzalez is a 61 y.o. female who presents for the following: Total body skin exam (No hx of skin ca, Parents with hx of BCC/SCC), Rosacea (Face, she thinks she is using Rhofade and metronidazole cr), and check bump (Nose, 2 wks). ?Patient also has hair loss that occurred after having COVID in December 2022. ?The patient presents for Total-Body Skin Exam (TBSE) for skin cancer screening and mole check.  The patient has spots, moles and lesions to be evaluated, some may be new or changing and the patient has concerns that these could be cancer. ? ?The following portions of the chart were reviewed this encounter and updated as appropriate:  ? Tobacco  Allergies  Meds  Problems  Med Hx  Surg Hx  Fam Hx   ?  ?Review of Systems:  No other skin or systemic complaints except as noted in HPI or Assessment and Plan. ? ?Objective  ?Well appearing patient in no apparent distress; mood and affect are within normal limits. ? ?A full examination was performed including scalp, head, eyes, ears, nose, lips, neck, chest, axillae, abdomen, back, buttocks, bilateral upper extremities, bilateral lower extremities, hands, feet, fingers, toes, fingernails, and toenails. All findings within normal limits unless otherwise noted below. ? ?face ?Erythema cheeks, nose, chin, glabella ? ?Scalp ?New hair growth temporal scalp ? ? ? ? ? ? ? ? ? ? ? ? ?R upper forehead x 1 ?Stuck on waxy paps with erythema ? ? ?Assessment & Plan  ?Rosacea ?face ?Rosacea is a chronic progressive skin condition usually affecting the face of adults, causing redness and/or acne bumps. It is treatable but not curable. It sometimes affects the eyes (ocular rosacea) as well. It may respond to topical and/or systemic medication and can flare with stress, sun exposure, alcohol, exercise and some foods.  Daily application of broad spectrum spf 30+ sunscreen to face is recommended to reduce flares. ? ?Discussed the  treatment option of BBL/laser.  Typically we recommend 1-3 treatment sessions about 5-8 weeks apart for best results.  The patient's condition may require "maintenance treatments" in the future.  The fee for BBL / laser treatments is $350 per treatment session for the whole face.  A fee can be quoted for other parts of the body. ?Insurance typically does not pay for BBL/laser treatments and therefore the fee is an out-of-pocket cost.  Recommend in fall. ? ?Start Oxymetazoline/Ivermetin/Niacinamide cr qam to face 30g, 4rf (sent to [facility]) ?Cont Metronidazole cr qhs ? 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 It does not progress to baldness, and the hair eventually grows back on its own. It can be triggered by recent illness, recent surgery, thyroid disease, low iron stores, vitamin D deficiency, fad diets or rapid weight loss, hormonal changes such as pregnancy or birth control pills, and some medication. Usually the hair loss starts 2-3 months after the illness or health change. Rarely, it can continue for longer than a year. ? ?Hx of Covid 04/2021 ?BP today 93/64 ?Pulse today 72 ?Weight today 154 lb ? ?D/c topical Rogaine ?Start Minoxidil 2.'5mg'$  1/2 po qd, pt has prescription at home, she has not started.  Discussed increasing to 2.'5mg'$  1 po qd in 3 months if no problems taking the 1/2 pill. ?Start Biotin 2.'5mg'$  1 po qd ? ?Inflamed seborrheic keratosis ?R upper forehead x 1 ?Destruction  of lesion - R upper forehead x 1 ?Complexity: simple   ?Destruction method: cryotherapy   ?Informed consent: discussed and consent obtained   ?Timeout:  patient name, date of birth, surgical site, and procedure verified ?Lesion destroyed using liquid nitrogen: Yes   ?Region frozen until ice ball extended beyond lesion: Yes   ?Outcome: patient tolerated procedure well with no  complications   ?Post-procedure details: wound care instructions given   ? ?Lentigines ?- Scattered tan macules ?- Due to sun exposure ?- Benign-appearing, observe ?- Recommend daily broad spectrum sunscreen SPF 30+ to sun-exposed areas, reapply every 2 hours as needed. ?- Call for any changes ?- Discussed BBL ? ?Seborrheic Keratoses ?- Stuck-on, waxy, tan-brown papules and/or plaques  ?- Benign-appearing ?- Discussed benign etiology and prognosis. ?- Observe ?- Call for any changes ? ?Melanocytic Nevi ?- Tan-brown and/or pink-flesh-colored symmetric macules and papules ?- Benign appearing on exam today ?- Observation ?- Call clinic for new or changing moles ?- Recommend daily use of broad spectrum spf 30+ sunscreen to sun-exposed areas.  ? ?Hemangiomas ?- Red papules ?- Discussed benign nature ?- Observe ?- Call for any changes ? ?Actinic Damage ?- Chronic condition, secondary to cumulative UV/sun exposure ?- diffuse scaly erythematous macules with underlying dyspigmentation ?- Recommend daily broad spectrum sunscreen SPF 30+ to sun-exposed areas, reapply every 2 hours as needed.  ?- Staying in the shade or wearing long sleeves, sun glasses (UVA+UVB protection) and wide brim hats (4-inch brim around the entire circumference of the hat) are also recommended for sun protection.  ?- Call for new or changing lesions. ? ?Skin cancer screening performed today. ? ?Return in about 4 months (around 12/22/2021) for Rosacea, Telogen Effluvium f/u. ? ?I, Othelia Pulling, RMA, am acting as scribe for Sarina Ser, MD . ?Documentation: I have reviewed the above documentation for accuracy and completeness, and I agree with the above. ? ?Sarina Ser, MD ? ? ?

## 2021-08-23 ENCOUNTER — Encounter: Payer: Self-pay | Admitting: Dermatology

## 2021-08-30 ENCOUNTER — Telehealth: Payer: Self-pay

## 2021-08-30 DIAGNOSIS — N951 Menopausal and female climacteric states: Secondary | ICD-10-CM

## 2021-08-30 DIAGNOSIS — Z79890 Hormone replacement therapy (postmenopausal): Secondary | ICD-10-CM

## 2021-08-30 MED ORDER — ESTRADIOL 0.075 MG/24HR TD PTTW: 1.0000 | MEDICATED_PATCH | TRANSDERMAL | 0 refills | Status: DC

## 2021-08-30 NOTE — Telephone Encounter (Signed)
Pt calling to see what pharm estradiol patches refill was sent to if sent; needs [facility] [address].  [phone] pt aware refill eRx'd. ?

## 2021-09-04 NOTE — Progress Notes (Signed)
? ?PCP: Doreen Beam, FNP ? ? ?Chief Complaint  ?Patient presents with  ? Gynecologic Exam  ?  Qs regarding patch dosage  ? ? ?HPI: ?     Lisa Gonzalez is a 61 y.o. [id number] whose LMP was No LMP recorded. Patient has had a hysterectomy., presents today for her annual examination.  Her menses are absent due to menopause/hyst for endometriosis. No PMB.  ? ?Pt on vivelle dot 0.075 mg. Vasomotor sx improved and pt would like to try lower dose. Also now having breast tenderness which is unusual for pt. Hx of insomnia, takes xanax 0.5 mg or lunesta (Rxd by PCP) a few nights weekly but concerned about addictive potential with them, so doesn't use nightly. Goes some nights without any meds and doesn't sleep those nights. Had worsening sleep sx with prometrium in past.  Exercise has helped mental health.  ? ?Sex activity: single partner, contraception - status post hysterectomy. She does not have vaginal dryness. ? ?Last Pap: N/A due to hyst; no hx of abn paps.  ?Hx of STDs: none ? ?Last mammogram: 02/14/21  Results were: normal--routine follow-up in 12 months. Seen for RT breast mass 12/21; benign cyst on mammo/ u/s. Pt with hx of lumpy breasts. No change in RT breast cyst.  ?There is no FH of breast cancer. There is no FH of ovarian cancer. The patient does do self-breast exams. ? ?Colonoscopy: 2022 with Dr. Marius Ditch with polyp;  Repeat due after 1 year. FH colon cancer in her MGM. ? ?Tobacco use: The patient denies current or previous tobacco use. ?Alcohol use: none  ?No drug use ?Exercise: very active ? ?She does get adequate calcium and Vitamin D in her diet. ? ?Labs with PCP. ? ? ?Past Medical History:  ?Diagnosis Date  ? Allergy   ? Anxiety   ? Back pain   ? Cyst of left breast   ? GERD (gastroesophageal reflux disease)   ? History of endometriosis   ? History of ovarian cyst   ? PTSD (post-traumatic stress disorder)   ? ? ?Past Surgical History:  ?Procedure Laterality Date  ? COLONOSCOPY WITH PROPOFOL N/A  03/21/2021  ? Procedure: COLONOSCOPY WITH PROPOFOL;  Surgeon: Virgel Manifold, MD;  Location: [facility] ENDOSCOPY;  Service: Endoscopy;  Laterality: N/A;  ? LAPAROSCOPIC VAGINAL HYSTERECTOMY    ? SPINAL FUSION  2018  ? SPINE SURGERY    ? ? ?Family History  ?Problem Relation Age of Onset  ? Autoimmune disease Mother   ? Heart attack Father 45  ? Cancer - Colon Maternal Grandmother   ? Alzheimer's disease Maternal Grandmother   ? Heart attack Maternal Grandfather   ? Bone cancer Paternal Grandmother   ? ? ?Social History  ? ?Socioeconomic History  ? Marital status: Married  ?  Spouse name: Not on file  ? Number of children: Not on file  ? Years of education: Not on file  ? Highest education level: Not on file  ?Occupational History  ? Not on file  ?Tobacco Use  ? Smoking status: Never  ? Smokeless tobacco: Never  ?Vaping Use  ? Vaping Use: Never used  ?Substance and Sexual Activity  ? Alcohol use: Yes  ?  Alcohol/week: 1.0 standard drink  ?  Types: 1 Glasses of wine per week  ? Drug use: Never  ? Sexual activity: Yes  ?  Birth control/protection: Surgical  ?  Comment: Hysterectomy  ?Other Topics Concern  ? Not on file  ?  Social History Narrative  ? Not on file  ? ?Social Determinants of Health  ? ?Financial Resource Strain: Not on file  ?Food Insecurity: Not on file  ?Transportation Needs: Not on file  ?Physical Activity: Not on file  ?Stress: Not on file  ?Social Connections: Not on file  ?Intimate Partner Violence: Not on file  ? ? ? ?Current Outpatient Medications:  ?  ALPRAZolam (XANAX) 0.5 MG tablet, Take 1 tablet (0.5 mg total) by mouth 3 (three) times daily as needed for anxiety (not to take with lunesta)., Disp: 90 tablet, Rfl: 0 ?  clotrimazole-betamethasone (LOTRISONE) cream, Apply externally BID for 2 wks, Disp: 15 g, Rfl: 0 ?  cyproheptadine (PERIACTIN) 4 MG tablet, Take 1 tablet (4 mg total) by mouth at bedtime., Disp: 30 tablet, Rfl: 0 ?  [START ON 09/06/2021] estradiol (VIVELLE-DOT) 0.05 MG/24HR patch,  Place 1 patch (0.05 mg total) onto the skin 2 (two) times a week., Disp: 24 patch, Rfl: 3 ?  Eszopiclone 3 MG TABS, TAKE 1 TAB AT BEDTIME AS NEEDED FOR SLEEP. MAY TRY 1/2 TAB 1ST TO SEE EFFECTIVE BEFORE FULL TABLET., Disp: 30 tablet, Rfl: 0 ?  loratadine (CLARITIN) 10 MG tablet, Take 10 mg by mouth daily as needed., Disp: , Rfl:  ?  methocarbamol (ROBAXIN) 500 MG tablet, Take 1 tablet (500 mg total) by mouth 4 (four) times daily. (Patient taking differently: Take 500 mg by mouth every 8 (eight) hours as needed.), Disp: 90 tablet, Rfl: 3 ?  minoxidil (LONITEN) 2.5 MG tablet, Take by mouth., Disp: , Rfl:  ?  pantoprazole (PROTONIX) 40 MG tablet, Take 1 tablet (40 mg total) by mouth daily., Disp: 90 tablet, Rfl: 2 ?  SUMAtriptan (IMITREX) 100 MG tablet, Take one  tablet for migraine as needed. May repeat in 2 hours if headache persists or recurs.Maximum dose in 200 mg per day., Disp: 10 tablet, Rfl: 0 ?  nortriptyline (PAMELOR) 10 MG capsule, Take 10 mg by mouth at bedtime. (Patient not taking: Reported on 09/05/2021), Disp: , Rfl:  ?  rosuvastatin (CRESTOR) 5 MG tablet, Take 1 tablet (5 mg total) by mouth daily. (Patient not taking: Reported on 09/05/2021), Disp: 90 tablet, Rfl: 3 ? ?Current Facility-Administered Medications:  ?  betamethasone acetate-betamethasone sodium phosphate (CELESTONE) injection 3 mg, 3 mg, Intra-articular, Once, Lisa Gonzalez, DPM ? ? ? ? ?ROS: ? ?Review of Systems  ?Constitutional:  Negative for fatigue, fever and unexpected weight change.  ?Respiratory:  Negative for cough, shortness of breath and wheezing.   ?Cardiovascular:  Negative for chest pain, palpitations and leg swelling.  ?Gastrointestinal:  Negative for blood in stool, constipation, diarrhea, nausea and vomiting.  ?Endocrine: Negative for cold intolerance, heat intolerance and polyuria.  ?Genitourinary:  Negative for dyspareunia, dysuria, flank pain, frequency, genital sores, hematuria, menstrual problem, pelvic pain, urgency,  vaginal bleeding, vaginal discharge and vaginal pain.  ?Musculoskeletal:  Negative for back pain, joint swelling and myalgias.  ?Skin:  Negative for rash.  ?Neurological:  Negative for dizziness, syncope, light-headedness, numbness and headaches.  ?Hematological:  Negative for adenopathy.  ?Psychiatric/Behavioral:  Positive for sleep disturbance. Negative for agitation, confusion and suicidal ideas. The patient is not nervous/anxious.  BREAST: No symptoms ? ? ? ?Objective: ?BP 100/70   Ht '5\' 8"'$  (1.727 m)   Wt 156 lb (70.8 kg)   BMI 23.72 kg/m?  ? ? ?Physical Exam ?Constitutional:   ?   Appearance: She is well-developed.  ?Genitourinary:  ?   Vulva normal.  ?  Genitourinary Comments: UTERUS/CX SURG REM  ?   Right Labia: No rash, tenderness, lesions or skin changes. ?   Left Labia: No tenderness, lesions, skin changes or rash. ?   Vaginal cuff intact. ?   No vaginal discharge, erythema or tenderness.  ? ?   Right Adnexa: not tender and no mass present. ?   Left Adnexa: not tender and no mass present. ?   Cervix is absent.  ?   No cervical friability or polyp.  ?   Uterus is not enlarged or tender.  ?   Uterus is absent.  ?Breasts: ?   Right: No mass, nipple discharge, skin change or tenderness.  ?   Left: No mass, nipple discharge, skin change or tenderness.  ?Neck:  ?   Thyroid: No thyromegaly.  ?Cardiovascular:  ?   Rate and Rhythm: Normal rate and regular rhythm.  ?   Heart sounds: Normal heart sounds. No murmur heard. ?Pulmonary:  ?   Effort: Pulmonary effort is normal.  ?   Breath sounds: Normal breath sounds.  ?Chest:  ? ? ?Abdominal:  ?   Palpations: Abdomen is soft.  ?   Tenderness: There is no abdominal tenderness. There is no guarding or rebound.  ?Musculoskeletal:     ?   General: Normal range of motion.  ?   Cervical back: Normal range of motion.  ?Lymphadenopathy:  ?   Cervical: No cervical adenopathy.  ?Neurological:  ?   General: No focal deficit present.  ?   Mental Status: She is alert and  oriented to person, place, and time.  ?   Cranial Nerves: No cranial nerve deficit.  ?Skin: ?   General: Skin is warm and dry.  ?Psychiatric:     ?   Mood and Affect: Mood normal.     ?   Behavior: Behavior normal

## 2021-09-05 ENCOUNTER — Encounter: Payer: Self-pay | Admitting: Obstetrics and Gynecology

## 2021-09-05 ENCOUNTER — Ambulatory Visit: Payer: Managed Care, Other (non HMO) | Attending: Orthopedic Surgery | Admitting: Physical Therapy

## 2021-09-05 ENCOUNTER — Ambulatory Visit (INDEPENDENT_AMBULATORY_CARE_PROVIDER_SITE_OTHER): Payer: Managed Care, Other (non HMO) | Admitting: Obstetrics and Gynecology

## 2021-09-05 VITALS — BP 100/70 | Ht 68.0 in | Wt 156.0 lb

## 2021-09-05 DIAGNOSIS — Z1231 Encounter for screening mammogram for malignant neoplasm of breast: Secondary | ICD-10-CM | POA: Diagnosis not present

## 2021-09-05 DIAGNOSIS — Z01419 Encounter for gynecological examination (general) (routine) without abnormal findings: Secondary | ICD-10-CM

## 2021-09-05 DIAGNOSIS — M79672 Pain in left foot: Secondary | ICD-10-CM | POA: Insufficient documentation

## 2021-09-05 DIAGNOSIS — M6208 Separation of muscle (nontraumatic), other site: Secondary | ICD-10-CM | POA: Diagnosis present

## 2021-09-05 DIAGNOSIS — Z79890 Hormone replacement therapy (postmenopausal): Secondary | ICD-10-CM

## 2021-09-05 DIAGNOSIS — R2689 Other abnormalities of gait and mobility: Secondary | ICD-10-CM | POA: Insufficient documentation

## 2021-09-05 DIAGNOSIS — N951 Menopausal and female climacteric states: Secondary | ICD-10-CM

## 2021-09-05 DIAGNOSIS — G4709 Other insomnia: Secondary | ICD-10-CM

## 2021-09-05 DIAGNOSIS — R102 Pelvic and perineal pain: Secondary | ICD-10-CM | POA: Diagnosis present

## 2021-09-05 DIAGNOSIS — M533 Sacrococcygeal disorders, not elsewhere classified: Secondary | ICD-10-CM | POA: Diagnosis not present

## 2021-09-05 DIAGNOSIS — M25551 Pain in right hip: Secondary | ICD-10-CM | POA: Insufficient documentation

## 2021-09-05 MED ORDER — ESTRADIOL 0.05 MG/24HR TD PTTW: 1.0000 | MEDICATED_PATCH | TRANSDERMAL | 3 refills | Status: DC

## 2021-09-05 MED ORDER — CYPROHEPTADINE HCL 4 MG PO TABS: 4.0000 mg | ORAL_TABLET | Freq: Every day | ORAL | 0 refills | Status: DC

## 2021-09-05 NOTE — Patient Instructions (Addendum)
I value your feedback and you entrusting us with your care. If you get a [facility] patient survey, I would appreciate you taking the time to let us know about your experience today. Thank you!  [facility]: [phone]      

## 2021-09-05 NOTE — Patient Instructions (Signed)
?  Low cobra modified  ? ?On belly, palms under armpits, elbows pointed to ceiling  ?Inhale: lengthen crown of the head away from shoulders ?Exhale, feel belly hug in and press palms into the floor, squeezing elbows/shoulder blades towards each other while chest lifts about 5 cm off of the floor. You should feel the hinging movement at mid back and not the low back.  ? ? ? ? ? ?Locust pose  ?Pillow under hips if needed for decreased low back pain ? ?Palms face midline by hips  ?Finger tips shooting straight down  ?Imagine holding pencil under your armpits ?Draw shoulders away from ears ?Inhale ?Exhale lift chest up slightly without feeling it in your back. The bend happens in the midback  ?(keep chin tucked)  ? ?___ ? ? ?One sided pushups at the wall ? ?Make sure to stack thorax over pelvis ( no pinched midback/low back junction/ shoulders leaning back) ? ?___ ?Practice weightbearing 50% in the feet and 50% sitting bones when sitting to prevent ( no pinched midback/low back junction/ shoulders leaning back) ? ?Practicing standing with  weightbearing 50% in the feet and 50% heel ( knees slightly bent) to prevent ( no pinched midback/low back junction/ shoulders leaning back) ?
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
## 2021-09-19 ENCOUNTER — Ambulatory Visit: Payer: Managed Care, Other (non HMO) | Admitting: Physical Therapy

## 2021-09-27 ENCOUNTER — Other Ambulatory Visit: Payer: Self-pay

## 2021-09-27 DIAGNOSIS — Z8669 Personal history of other diseases of the nervous system and sense organs: Secondary | ICD-10-CM

## 2021-09-27 MED ORDER — SUMATRIPTAN SUCCINATE 100 MG PO TABS: ORAL_TABLET | ORAL | 0 refills | Status: DC

## 2021-09-29 ENCOUNTER — Other Ambulatory Visit: Payer: Self-pay | Admitting: Obstetrics and Gynecology

## 2021-09-29 DIAGNOSIS — G4709 Other insomnia: Secondary | ICD-10-CM

## 2021-10-24 ENCOUNTER — Ambulatory Visit: Payer: Commercial Managed Care - HMO | Attending: Orthopedic Surgery | Admitting: Physical Therapy

## 2021-10-24 DIAGNOSIS — M25551 Pain in right hip: Secondary | ICD-10-CM | POA: Diagnosis present

## 2021-10-24 DIAGNOSIS — R2689 Other abnormalities of gait and mobility: Secondary | ICD-10-CM | POA: Diagnosis present

## 2021-10-24 DIAGNOSIS — M533 Sacrococcygeal disorders, not elsewhere classified: Secondary | ICD-10-CM | POA: Diagnosis present

## 2021-10-24 DIAGNOSIS — R102 Pelvic and perineal pain: Secondary | ICD-10-CM

## 2021-10-24 DIAGNOSIS — M6208 Separation of muscle (nontraumatic), other site: Secondary | ICD-10-CM | POA: Diagnosis present

## 2021-10-24 DIAGNOSIS — M79672 Pain in left foot: Secondary | ICD-10-CM

## 2021-10-24 NOTE — Addendum Note (Signed)
Addended by: Jerl Mina on: 10/24/2021 01:14 PM   Modules accepted: Orders

## 2021-10-24 NOTE — Patient Instructions (Signed)
Trying multifidis again but standing and with red band   __   L arm straight by wall to lengthening L flank   __
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
## 2021-11-02 ENCOUNTER — Other Ambulatory Visit: Payer: Self-pay | Admitting: Obstetrics and Gynecology

## 2021-11-02 DIAGNOSIS — Z79890 Hormone replacement therapy (postmenopausal): Secondary | ICD-10-CM

## 2021-11-02 DIAGNOSIS — N951 Menopausal and female climacteric states: Secondary | ICD-10-CM

## 2021-11-14 ENCOUNTER — Encounter: Payer: Managed Care, Other (non HMO) | Admitting: Physical Therapy

## 2021-11-26 ENCOUNTER — Encounter: Payer: Self-pay | Admitting: Dermatology

## 2021-11-26 ENCOUNTER — Encounter: Payer: Self-pay | Admitting: Obstetrics and Gynecology

## 2021-11-26 DIAGNOSIS — N6310 Unspecified lump in the right breast, unspecified quadrant: Secondary | ICD-10-CM

## 2021-11-28 ENCOUNTER — Encounter: Payer: Managed Care, Other (non HMO) | Admitting: Physical Therapy

## 2021-12-12 ENCOUNTER — Encounter: Payer: Self-pay | Admitting: Family Medicine

## 2021-12-12 ENCOUNTER — Ambulatory Visit (INDEPENDENT_AMBULATORY_CARE_PROVIDER_SITE_OTHER): Payer: Commercial Managed Care - HMO | Admitting: Family Medicine

## 2021-12-12 VITALS — BP 112/60 | HR 89 | Temp 98.7°F | Resp 12 | Ht 68.0 in | Wt 153.8 lb

## 2021-12-12 DIAGNOSIS — G4709 Other insomnia: Secondary | ICD-10-CM

## 2021-12-12 DIAGNOSIS — M25521 Pain in right elbow: Secondary | ICD-10-CM

## 2021-12-12 DIAGNOSIS — F419 Anxiety disorder, unspecified: Secondary | ICD-10-CM

## 2021-12-12 DIAGNOSIS — E785 Hyperlipidemia, unspecified: Secondary | ICD-10-CM

## 2021-12-12 DIAGNOSIS — R799 Abnormal finding of blood chemistry, unspecified: Secondary | ICD-10-CM

## 2021-12-12 MED ORDER — QUVIVIQ 25 MG PO TABS: 25.0000 mg | ORAL_TABLET | Freq: Every evening | ORAL | 0 refills | Status: DC | PRN

## 2021-12-12 NOTE — Assessment & Plan Note (Signed)
Poorly controlled.  She has great sleep hygiene.  Discuss switching over to an alternative medication to see if that will provide better sleep benefit which she potentially could take more frequently.  She will discontinue the Lunesta.  We will start on quviviq 25 mg nightly.  She will monitor for excessive drowsiness or other side effects with this medication.

## 2021-12-12 NOTE — Assessment & Plan Note (Signed)
Recheck today.  Discussed adequate hydration.

## 2021-12-12 NOTE — Assessment & Plan Note (Signed)
Check lipid panel today.  She will continue healthy exercise.

## 2021-12-12 NOTE — Assessment & Plan Note (Signed)
I suspect this is related to tennis elbow.  Discussed icing the area for 10 to 15 minutes 2-3 times a day.  Discussed a tennis elbow brace.  If not improving over the next 1 to 2 weeks she will let us know we can refer to sports medicine.

## 2021-12-12 NOTE — Assessment & Plan Note (Signed)
Well-controlled.  She will monitor for recurrence.

## 2021-12-12 NOTE — Patient Instructions (Signed)
Nice to see you. We will try Quviviq for sleep.  If it is too expensive please let me know. We will check lab work today and let you know what the results are. Please try icing your right elbow for 10 to 15 minutes 2-3 times a day and wearing the tennis elbow brace.  If this is not improving over the next couple of weeks please let me know.

## 2021-12-12 NOTE — Progress Notes (Signed)
Tommi Rumps, MD Phone: [phone]  Lisa Gonzalez is a 61 y.o. female who presents today for follow-up.  Anxiety: Patient notes this is good now.  She has not required the Xanax recently.  No depression.  Insomnia: This is a chronic issue since she went through menopause.  In the past she has taken Xanax to help her sleep.  She notes she has been taking Lunesta 3 mg 2 times a week to help with her sleep.  She meditates and does body scans.  She goes to bed around the same time each night at 9 PM though often times it will take until 11 or 12 for her to fall asleep.  She occasionally takes Benadryl when she has trouble falling asleep.  She will listen to books.  She wakes up between 530 and 6:30 AM.  She has 1 caffeinated beverage early in the day.  She rarely drinks alcohol.  She has a good bedtime routine as well.  She wonders about other medications that she could take for sleep given that there is no opportunity to go up on the Lunesta dosed.  She notes in the past Ambien did not work well for her given side effects of abnormal dreams.  Right elbow pain: Patient notes this has been bothering her for a few weeks.  It started after she started biking more.  Notes it is a throbbing discomfort along the lateral epicondyle area though does move up her arm a little bit and down her arm a little bit.  She notes using a shovel made it worse.  Hyperlipidemia: Patient has deferred taking medication for this.  She has been doing limonene garlic daily to see if that will help.  Elevated BUN: Patient reports she drinks 50+ ounces of water daily.  Social History   Tobacco Use  Smoking Status Never  Smokeless Tobacco Never    Current Outpatient Medications on File Prior to Visit  Medication Sig Dispense Refill   cyproheptadine (PERIACTIN) 4 MG tablet Take 1 tablet (4 mg total) by mouth at bedtime. 30 tablet 0   estradiol (VIVELLE-DOT) 0.05 MG/24HR patch Place 1 patch (0.05 mg total) onto the  skin 2 (two) times a week. 24 patch 3   loratadine (CLARITIN) 10 MG tablet Take 10 mg by mouth daily as needed.     methocarbamol (ROBAXIN) 500 MG tablet Take 1 tablet (500 mg total) by mouth 4 (four) times daily. (Patient taking differently: Take 500 mg by mouth every 8 (eight) hours as needed.) 90 tablet 3   pantoprazole (PROTONIX) 40 MG tablet Take 1 tablet (40 mg total) by mouth daily. 90 tablet 2   SUMAtriptan (IMITREX) 100 MG tablet Take one  tablet for migraine as needed. May repeat in 2 hours if headache persists or recurs.Maximum dose in 200 mg per day. 10 tablet 0   Current Facility-Administered Medications on File Prior to Visit  Medication Dose Route Frequency Provider Last Rate Last Admin   betamethasone acetate-betamethasone sodium phosphate (CELESTONE) injection 3 mg  3 mg Intra-articular Once Edrick Kins, DPM         ROS see history of present illness  Objective  Physical Exam Vitals:   12/12/21 1422  BP: 112/60  Pulse: 89  Resp: 12  Temp: 98.7 F (37.1 C)  SpO2: 96%    BP Readings from Last 3 Encounters:  12/12/21 112/60  09/05/21 100/70  08/01/21 102/64   Wt Readings from Last 3 Encounters:  12/12/21 153 lb 12.8  oz (69.8 kg)  09/05/21 156 lb (70.8 kg)  08/01/21 156 lb 6.4 oz (70.9 kg)    Physical Exam Constitutional:      General: She is not in acute distress.    Appearance: She is not diaphoretic.  Cardiovascular:     Rate and Rhythm: Normal rate and regular rhythm.     Heart sounds: Normal heart sounds.  Pulmonary:     Effort: Pulmonary effort is normal.     Breath sounds: Normal breath sounds.  Musculoskeletal:     Comments: Patient is tender over the right lateral epicondyles and the surrounding soft tissues, there is some soft tissue tenderness more proximally up her arm as well, no swelling, she has full range of motion of her right elbow  Skin:    General: Skin is warm and dry.  Neurological:     Mental Status: She is alert.       Assessment/Plan: Please see individual problem list.  Problem List Items Addressed This Visit     Anxiety (Chronic)    Well-controlled.  She will monitor for recurrence.      Hyperlipidemia (Chronic)    Check lipid panel today.  She will continue healthy exercise.      Relevant Orders   Lipid panel   Comp Met (CMET)   Insomnia - Primary (Chronic)    Poorly controlled.  She has great sleep hygiene.  Discuss switching over to an alternative medication to see if that will provide better sleep benefit which she potentially could take more frequently.  She will discontinue the Lunesta.  We will start on quviviq 25 mg nightly.  She will monitor for excessive drowsiness or other side effects with this medication.      Relevant Medications   Daridorexant HCl (QUVIVIQ) 25 MG TABS   Elevated BUN    Recheck today.  Discussed adequate hydration.      Right elbow pain    I suspect this is related to tennis elbow.  Discussed icing the area for 10 to 15 minutes 2-3 times a day.  Discussed a tennis elbow brace.  If not improving over the next 1 to 2 weeks she will let us know we can refer to sports medicine.       Return in about 6 months (around 06/14/2022).   Tommi Rumps, MD [place]

## 2021-12-13 LAB — LIPID PANEL
Cholesterol: 258 mg/dL — ABNORMAL HIGH (ref 0–200)
HDL: 51.9 mg/dL (ref >39.00)
NonHDL: 205.61
Total CHOL/HDL Ratio: 5
Triglycerides: 276 mg/dL — ABNORMAL HIGH (ref 0.0–149.0)
VLDL: 55.2 mg/dL — ABNORMAL HIGH (ref 0.0–40.0)

## 2021-12-13 LAB — COMPREHENSIVE METABOLIC PANEL
ALT: 15 U/L (ref 0–35)
AST: 18 U/L (ref 0–37)
Albumin: 4.6 g/dL (ref 3.5–5.2)
Alkaline Phosphatase: 56 U/L (ref 39–117)
BUN: 27 mg/dL — ABNORMAL HIGH (ref 6–23)
CO2: 29 mEq/L (ref 19–32)
Calcium: 9.6 mg/dL (ref 8.4–10.5)
Chloride: 101 mEq/L (ref 96–112)
Creatinine, Ser: 1.08 mg/dL (ref 0.40–1.20)
GFR: 55.58 mL/min — ABNORMAL LOW (ref >60.00)
Glucose, Bld: 93 mg/dL (ref 70–99)
Potassium: 3.8 mEq/L (ref 3.5–5.1)
Sodium: 141 mEq/L (ref 135–145)
Total Bilirubin: 0.5 mg/dL (ref 0.2–1.2)
Total Protein: 7 g/dL (ref 6.0–8.3)

## 2021-12-13 LAB — LDL CHOLESTEROL, DIRECT: Direct LDL: 169 mg/dL

## 2021-12-14 ENCOUNTER — Ambulatory Visit
Admission: RE | Admit: 2021-12-14 | Discharge: 2021-12-14 | Disposition: A | Discharge status: Home / Self Care | Payer: Commercial Managed Care - HMO | Source: Ambulatory Visit | Attending: Obstetrics and Gynecology | Admitting: Obstetrics and Gynecology

## 2021-12-14 ENCOUNTER — Encounter: Payer: Self-pay | Admitting: Family Medicine

## 2021-12-14 ENCOUNTER — Telehealth: Payer: Self-pay

## 2021-12-14 DIAGNOSIS — N6310 Unspecified lump in the right breast, unspecified quadrant: Secondary | ICD-10-CM | POA: Insufficient documentation

## 2021-12-14 NOTE — Telephone Encounter (Signed)
LMTCB for lab results.  

## 2021-12-15 ENCOUNTER — Encounter: Payer: Self-pay | Admitting: Obstetrics and Gynecology

## 2022-01-02 ENCOUNTER — Ambulatory Visit: Payer: Commercial Managed Care - HMO | Admitting: Dermatology

## 2022-01-10 ENCOUNTER — Other Ambulatory Visit: Payer: Self-pay | Admitting: Obstetrics and Gynecology

## 2022-01-10 ENCOUNTER — Encounter: Payer: Self-pay | Admitting: Obstetrics and Gynecology

## 2022-01-10 DIAGNOSIS — N951 Menopausal and female climacteric states: Secondary | ICD-10-CM

## 2022-01-10 DIAGNOSIS — Z79890 Hormone replacement therapy (postmenopausal): Secondary | ICD-10-CM

## 2022-01-10 MED ORDER — ESTRADIOL 0.075 MG/24HR TD PTTW: 1.0000 | MEDICATED_PATCH | TRANSDERMAL | 2 refills | Status: DC

## 2022-01-15 ENCOUNTER — Ambulatory Visit (INDEPENDENT_AMBULATORY_CARE_PROVIDER_SITE_OTHER): Payer: Commercial Managed Care - HMO | Admitting: Podiatry

## 2022-01-15 DIAGNOSIS — M7742 Metatarsalgia, left foot: Secondary | ICD-10-CM | POA: Diagnosis not present

## 2022-01-15 DIAGNOSIS — M7741 Metatarsalgia, right foot: Secondary | ICD-10-CM

## 2022-01-15 MED ORDER — GABAPENTIN 100 MG PO CAPS: 100.0000 mg | ORAL_CAPSULE | Freq: Three times a day (TID) | ORAL | 1 refills | Status: DC

## 2022-01-15 NOTE — Progress Notes (Signed)
Chief Complaint  Patient presents with   Foot Pain    Patient is here for bilateral foot pain.   HPI: 61 y.o. female presenting today for follow-up evaluation of bilateral forefoot pain.  Patient states that now the pain is developing to the bilateral feet.  She can only wear her custom orthotics for certain period of time before she has to switch shoes.  She has not taking anything currently orally for management.  She presents for further treatment and eval  Past Medical History:  Diagnosis Date   Allergy    Anxiety    Back pain    Cyst of left breast    GERD (gastroesophageal reflux disease)    History of endometriosis    History of ovarian cyst    PTSD (post-traumatic stress disorder)     Past Surgical History:  Procedure Laterality Date   COLONOSCOPY WITH PROPOFOL N/A 03/21/2021   Procedure: COLONOSCOPY WITH PROPOFOL;  Surgeon: Virgel Manifold, MD;  Location: [facility] ENDOSCOPY;  Service: Endoscopy;  Laterality: N/A;   LAPAROSCOPIC VAGINAL HYSTERECTOMY     SPINAL FUSION  2018   SPINE SURGERY      Allergies  Allergen Reactions   Meperidine Swelling and Nausea Only    Blood Pressure Dropped Low BP and pass out.    Hydrocodone Nausea And Vomiting, Swelling and Nausea Only    Hot and cold chills, vomiting, headache, and chest pain  Other reaction(s): Vomiting Hot and cold chills, headache, chest pain   Hydromorphone Nausea And Vomiting and Nausea Only    Other reaction(s): Vomiting   Opium Other (See Comments)    Hot and cold chills, vomiting, headache and chest pain   Oxycodone Nausea And Vomiting and Nausea Only    Other reaction(s): Other (comments) Hot and cold chills, headache, vomiting, and chest pain.      Physical Exam: General: The patient is alert and oriented x3 in no acute distress.  Dermatology: Skin is warm, dry and supple bilateral lower extremities. Negative for open lesions or macerations.  Vascular: Palpable pedal pulses bilaterally.  Capillary refill within normal limits.  Negative for any significant edema or erythema  Neurological: Light touch and protective threshold grossly intact  Musculoskeletal Exam: Slight pain on palpation noted to the second interspace of the left foot with pain with lateral compression of the metatarsal heads.  There is also a splay toe deformity noted on weightbearing and loading of the foot.  Generalized pain throughout palpation of the bilateral forefoot consistent with a global metatarsalgia  Radiographic Exam LT foot 05/15/2021:  Normal osseous mineralization. Joint spaces preserved. No fracture/dislocation/boney destruction.  There is some splay toe deformity between the second and third digits visible on x-ray consistent with the clinical findings of a Morton's neuroma  Assessment: 1.  Morton's neuroma left second interspace 2.  Global metatarsalgia bilateral forefoot   Plan of Care:  1. Patient evaluated.  No NSAIDs due to GI complications.  Patient also states that although she took the Medrol Dosepak she was unable to sleep. 2.  Continue custom molded orthotics 3.  Recommend wide fitting shoes that do not constrict the toebox area 4.  Prescription for gabapentin 100 mg TID sent to the pharmacy to see if this helps alleviate some of her forefoot metatarsalgia pain 5.  Continue topical Voltaren gel as needed 6.  Return to clinic as needed   Edrick Kins, DPM [facility]  Dr. Edrick Kins, DPM  [address], [place] [address]                Office [phone]  Fax [phone]

## 2022-01-30 ENCOUNTER — Ambulatory Visit (INDEPENDENT_AMBULATORY_CARE_PROVIDER_SITE_OTHER): Payer: Self-pay | Admitting: Dermatology

## 2022-01-30 DIAGNOSIS — I781 Nevus, non-neoplastic: Secondary | ICD-10-CM

## 2022-01-30 DIAGNOSIS — L988 Other specified disorders of the skin and subcutaneous tissue: Secondary | ICD-10-CM

## 2022-01-30 DIAGNOSIS — L719 Rosacea, unspecified: Secondary | ICD-10-CM

## 2022-01-30 DIAGNOSIS — L821 Other seborrheic keratosis: Secondary | ICD-10-CM

## 2022-01-30 NOTE — Patient Instructions (Signed)
Due to recent changes in healthcare laws, you may see results of your pathology and/or laboratory studies on MyChart before the doctors have had a chance to review them. We understand that in some cases there may be results that are confusing or concerning to you. Please understand that not all results are received at the same time and often the doctors may need to interpret multiple results in order to provide you with the best plan of care or course of treatment. Therefore, we ask that you please give us 2 business days to thoroughly review all your results before contacting the office for clarification. Should we see a critical lab result, you will be contacted sooner.   If You Need Anything After Your Visit  If you have any questions or concerns for your doctor, please call our main line at [phone] and press option 4 to reach your doctor's medical assistant. If no one answers, please leave a voicemail as directed and we will return your call as soon as possible. Messages left after 4 pm will be answered the following business day.   You may also send us a message via MyChart. We typically respond to MyChart messages within 1-2 business days.  For prescription refills, please ask your pharmacy to contact our office. Our fax number is [phone].  If you have an urgent issue when the clinic is closed that cannot wait until the next business day, you can page your doctor at the number below.    Please note that while we do our best to be available for urgent issues outside of office hours, we are not available 24/7.   If you have an urgent issue and are unable to reach us, you may choose to seek medical care at your doctor's office, retail clinic, urgent care center, or emergency room.  If you have a medical emergency, please immediately call 911 or go to the emergency department.  Pager Numbers  - Dr. Kowalski: [phone]  - Dr. Moye: [phone]  - Dr. Stewart:  [phone]  In the event of inclement weather, please call our main line at [phone] for an update on the status of any delays or closures.  Dermatology Medication Tips: Please keep the boxes that topical medications come in in order to help keep track of the instructions about where and how to use these. Pharmacies typically print the medication instructions only on the boxes and not directly on the medication tubes.   If your medication is too expensive, please contact our office at [phone] option 4 or send us a message through MyChart.   We are unable to tell what your co-pay for medications will be in advance as this is different depending on your insurance coverage. However, we may be able to find a substitute medication at lower cost or fill out paperwork to get insurance to cover a needed medication.   If a prior authorization is required to get your medication covered by your insurance company, please allow us 1-2 business days to complete this process.  Drug prices often vary depending on where the prescription is filled and some pharmacies may offer cheaper prices.  The website www.goodrx.com contains coupons for medications through different pharmacies. The prices here do not account for what the cost may be with help from insurance (it may be cheaper with your insurance), but the website can give you the price if you did not use any insurance.  - You can print the associated coupon and take it with   your prescription to the pharmacy.  - You may also stop by our office during regular business hours and pick up a GoodRx coupon card.  - If you need your prescription sent electronically to a different pharmacy, notify our office through [facility] MyChart or by phone at [phone] option 4.     Si Usted Necesita Algo Despus de Su Visita  Tambin puede enviarnos un mensaje a travs de MyChart. Por lo general respondemos a los mensajes de MyChart en el transcurso de 1 a 2  das hbiles.  Para renovar recetas, por favor pida a su farmacia que se ponga en contacto con nuestra oficina. Nuestro nmero de fax es el [phone].  Si tiene un asunto urgente cuando la clnica est cerrada y que no puede esperar hasta el siguiente da hbil, puede llamar/localizar a su doctor(a) al nmero que aparece a continuacin.   Por favor, tenga en cuenta que aunque hacemos todo lo posible para estar disponibles para asuntos urgentes fuera del horario de oficina, no estamos disponibles las 24 horas del da, los 7 das de la semana.   Si tiene un problema urgente y no puede comunicarse con nosotros, puede optar por buscar atencin mdica  en el consultorio de su doctor(a), en una clnica privada, en un centro de atencin urgente o en una sala de emergencias.  Si tiene una emergencia mdica, por favor llame inmediatamente al 911 o vaya a la sala de emergencias.  Nmeros de bper  - Dr. Kowalski: [phone]  - Dra. Moye: [phone]  - Dra. Stewart: [phone]  En caso de inclemencias del tiempo, por favor llame a nuestra lnea principal al [phone] para una actualizacin sobre el estado de cualquier retraso o cierre.  Consejos para la medicacin en dermatologa: Por favor, guarde las cajas en las que vienen los medicamentos de uso tpico para ayudarle a seguir las instrucciones sobre dnde y cmo usarlos. Las farmacias generalmente imprimen las instrucciones del medicamento slo en las cajas y no directamente en los tubos del medicamento.   Si su medicamento es muy caro, por favor, pngase en contacto con nuestra oficina llamando al [phone] y presione la opcin 4 o envenos un mensaje a travs de MyChart.   No podemos decirle cul ser su copago por los medicamentos por adelantado ya que esto es diferente dependiendo de la cobertura de su seguro. Sin embargo, es posible que podamos encontrar un medicamento sustituto a menor costo o llenar un formulario para que el  seguro cubra el medicamento que se considera necesario.   Si se requiere una autorizacin previa para que su compaa de seguros cubra su medicamento, por favor permtanos de 1 a 2 das hbiles para completar este proceso.  Los precios de los medicamentos varan con frecuencia dependiendo del lugar de dnde se surte la receta y alguna farmacias pueden ofrecer precios ms baratos.  El sitio web www.goodrx.com tiene cupones para medicamentos de diferentes farmacias. Los precios aqu no tienen en cuenta lo que podra costar con la ayuda del seguro (puede ser ms barato con su seguro), pero el sitio web puede darle el precio si no utiliz ningn seguro.  - Puede imprimir el cupn correspondiente y llevarlo con su receta a la farmacia.  - Tambin puede pasar por nuestra oficina durante el horario de atencin regular y recoger una tarjeta de cupones de GoodRx.  - Si necesita que su receta se enve electrnicamente a una farmacia diferente, informe a nuestra oficina a travs de MyChart de    o por telfono llamando al [phone] y presione la opcin 4.  

## 2022-01-30 NOTE — Progress Notes (Signed)
Follow-Up Visit   Subjective  Lisa Gonzalez is a 61 y.o. female who presents for the following: Rosacea (Patient here today for her first treatment with BBL laser). She would also like to discuss the neck and underside chin area and possible treatment options to help with "waddle".  The following portions of the chart were reviewed this encounter and updated as appropriate:   Tobacco  Allergies  Meds  Problems  Med Hx  Surg Hx  Fam Hx     Review of Systems:  No other skin or systemic complaints except as noted in HPI or Assessment and Plan.  Objective  Well appearing patient in no apparent distress; mood and affect are within normal limits.  A focused examination was performed including the face. Relevant physical exam findings are noted in the Assessment and Plan.  Face Dilated vessels of the cheeks, chin and nose.                    Assessment & Plan  Rosacea with Telangiectasias Face Rosacea is a chronic progressive skin condition usually affecting the face of adults, causing redness and/or acne bumps. It is treatable but not curable. It sometimes affects the eyes (ocular rosacea) as well. It may respond to topical and/or systemic medication and can flare with stress, sun exposure, alcohol, exercise and some foods.  Daily application of broad spectrum spf 30+ sunscreen to face is recommended to reduce flares.  Discussed the treatment option of BBL/laser.  Typically we recommend 1-3 treatment sessions about 5-8 weeks apart for best results.  The patient's condition may require "maintenance treatments" in the future.  The fee for BBL / laser treatments is $350 per treatment session for the whole face.  A fee can be quoted for other parts of the body. Insurance typically does not pay for BBL/laser treatments and therefore the fee is an out-of-pocket cost.  Photorejuvenation - Face Prior to the procedure, the patient's past medical history, medications,  allergies, and the rare but potential risks and complications were reviewed with the patient and a signed consent was obtained.  Pre and post treatment care was discussed and instructions provided.   [place] BBL - 01/30/22 1500      Patient Details   Skin Type: II    Anesthestic Cream Applied: No    Photo Takes: Yes    Consent Signed: Yes      Treatment Details   Date: 01/30/22    Treatment #: 1    Area: Face    Filter: 1st Pass      1st Pass   Location: F    Device: 560 filter    BBL j/cm2: 26    PW Msec Sec: 27    Cooling Temp: 20    Pulses: 89     Patient tolerated the procedure well.   Nancy Fetter avoidance was stressed. The patient will call with any problems, questions or concerns prior to their next appointment.  Elastosis of skin Face, neck, underside chin Discussed Skintyte laser treatment for the neck and underside chin. Patient may also consider liposuction.   Seborrheic Keratoses - "thin" - on face - Stuck-on, waxy, tan-brown papules and/or plaques  - Benign-appearing - Discussed benign etiology and prognosis. - Observe - Call for any changes - Discussed treatment with BBL laser at her next treatment session. Will cost $60 for first lesion then $15 for each lesion thereafter.   Return for appointment as scheduled.  Luther Redo, CMA,  am acting as scribe for Sarina Ser, MD . Documentation: I have reviewed the above documentation for accuracy and completeness, and I agree with the above.  Sarina Ser, MD

## 2022-02-03 ENCOUNTER — Encounter: Payer: Self-pay | Admitting: Dermatology

## 2022-02-03 ENCOUNTER — Other Ambulatory Visit: Payer: Self-pay | Admitting: Obstetrics and Gynecology

## 2022-02-03 DIAGNOSIS — Z79890 Hormone replacement therapy (postmenopausal): Secondary | ICD-10-CM

## 2022-02-03 DIAGNOSIS — N951 Menopausal and female climacteric states: Secondary | ICD-10-CM

## 2022-02-13 ENCOUNTER — Telehealth: Payer: Self-pay | Admitting: Podiatry

## 2022-02-13 NOTE — Telephone Encounter (Signed)
Patient called and stated that she needed a handicap place card for her foot pain. She is unable to walk far.  Please advise

## 2022-02-13 NOTE — Telephone Encounter (Signed)
That is totally fine.  She can come to the front office and pick 1 up.  Thanks, Dr. Amalia Hailey

## 2022-02-27 ENCOUNTER — Ambulatory Visit: Payer: Managed Care, Other (non HMO) | Admitting: Dermatology

## 2022-03-27 ENCOUNTER — Ambulatory Visit: Payer: Self-pay | Admitting: Dermatology

## 2022-04-10 ENCOUNTER — Encounter: Payer: Self-pay | Admitting: Family Medicine

## 2022-04-10 DIAGNOSIS — F5101 Primary insomnia: Secondary | ICD-10-CM

## 2022-04-11 MED ORDER — ESZOPICLONE 3 MG PO TABS: 3.0000 mg | ORAL_TABLET | Freq: Every day | ORAL | 0 refills | Status: DC

## 2022-04-15 ENCOUNTER — Encounter: Payer: Self-pay | Admitting: Family Medicine

## 2022-04-15 ENCOUNTER — Other Ambulatory Visit: Payer: Self-pay

## 2022-04-15 DIAGNOSIS — E785 Hyperlipidemia, unspecified: Secondary | ICD-10-CM

## 2022-04-15 DIAGNOSIS — Z1211 Encounter for screening for malignant neoplasm of colon: Secondary | ICD-10-CM

## 2022-04-15 NOTE — Telephone Encounter (Signed)
Patient is scheduled to discuss her sleep medication on Thursday 04/25/22 at 10:00

## 2022-04-16 ENCOUNTER — Other Ambulatory Visit: Payer: Self-pay

## 2022-04-16 ENCOUNTER — Telehealth: Payer: Self-pay

## 2022-04-16 DIAGNOSIS — Z86010 Personal history of colonic polyps: Secondary | ICD-10-CM

## 2022-04-16 MED ORDER — NA SULFATE-K SULFATE-MG SULF 17.5-3.13-1.6 GM/177ML PO SOLN: 1.0000 | Freq: Once | ORAL | 0 refills | Status: AC

## 2022-04-16 NOTE — Telephone Encounter (Signed)
  Gastroenterology Pre-Procedure Review   Request Date: 05/08/22 Requesting Physician: Dr. Marius Ditch   PATIENT REVIEW QUESTIONS: The patient responded to the following health history questions as indicated:     1. Are you having any GI issues? No 2. Do you have a personal history of Polyps? Yes char note last colonoscopy performed by Dr. Bonna Gains Oct 2022 noted Three 4 to 7 mm polyps in the rectum, and One 12 mm polyp in the transverse colon 3. Do you have a family history of Colon Cancer or Polyps? No 4. Diabetes Mellitus? no 5. Joint replacements in the past 12 months?no 6. Major health problems in the past 3 months?no 7. Any artificial heart valves, MVP, or defibrillator?no  Patient confirms/reports the following medications:  Current Outpatient Medications  Medication Sig Dispense Refill   cyproheptadine (PERIACTIN) 4 MG tablet Take 1 tablet (4 mg total) by mouth at bedtime. 30 tablet 0   estradiol (VIVELLE-DOT) 0.075 MG/24HR Place 1 patch onto the skin 2 (two) times a week. 24 patch 2   eszopiclone 3 MG TABS Take 1 tablet (3 mg total) by mouth at bedtime. Take immediately before bedtime 30 tablet 0   gabapentin (NEURONTIN) 100 MG capsule Take 1 capsule (100 mg total) by mouth 3 (three) times daily. 90 capsule 1   loratadine (CLARITIN) 10 MG tablet Take 10 mg by mouth daily as needed.     methocarbamol (ROBAXIN) 500 MG tablet Take 1 tablet (500 mg total) by mouth 4 (four) times daily. (Patient taking differently: Take 500 mg by mouth every 8 (eight) hours as needed.) 90 tablet 3   pantoprazole (PROTONIX) 40 MG tablet Take 1 tablet (40 mg total) by mouth daily. 90 tablet 2   SUMAtriptan (IMITREX) 100 MG tablet Take one  tablet for migraine as needed. May repeat in 2 hours if headache persists or recurs.Maximum dose in 200 mg per day. 10 tablet 0   Current Facility-Administered Medications  Medication Dose Route Frequency Provider Last Rate Last Admin   betamethasone  acetate-betamethasone sodium phosphate (CELESTONE) injection 3 mg  3 mg Intra-articular Once Edrick Kins, DPM        Patient confirms/reports the following allergies:  Allergies  Allergen Reactions   Meperidine Swelling and Nausea Only    Blood Pressure Dropped Low BP and pass out.    Hydrocodone Nausea And Vomiting, Swelling and Nausea Only    Hot and cold chills, vomiting, headache, and chest pain  Other reaction(s): Vomiting Hot and cold chills, headache, chest pain   Hydromorphone Nausea And Vomiting and Nausea Only    Other reaction(s): Vomiting   Opium Other (See Comments)    Hot and cold chills, vomiting, headache and chest pain   Oxycodone Nausea And Vomiting and Nausea Only    Other reaction(s): Other (comments) Hot and cold chills, headache, vomiting, and chest pain.     No orders of the defined types were placed in this encounter.   AUTHORIZATION INFORMATION Primary Insurance: 1D#: Group #:  Secondary Insurance: 1D#: Group #:  SCHEDULE INFORMATION: Date:  Time: Location:

## 2022-04-17 ENCOUNTER — Encounter: Payer: Self-pay | Admitting: Family Medicine

## 2022-04-17 DIAGNOSIS — F419 Anxiety disorder, unspecified: Secondary | ICD-10-CM

## 2022-04-22 ENCOUNTER — Other Ambulatory Visit (INDEPENDENT_AMBULATORY_CARE_PROVIDER_SITE_OTHER): Payer: Commercial Managed Care - HMO

## 2022-04-22 DIAGNOSIS — E785 Hyperlipidemia, unspecified: Secondary | ICD-10-CM

## 2022-04-22 LAB — COMPREHENSIVE METABOLIC PANEL
ALT: 13 U/L (ref 0–35)
AST: 15 U/L (ref 0–37)
Albumin: 4.2 g/dL (ref 3.5–5.2)
Alkaline Phosphatase: 51 U/L (ref 39–117)
BUN: 23 mg/dL (ref 6–23)
CO2: 29 mEq/L (ref 19–32)
Calcium: 8.9 mg/dL (ref 8.4–10.5)
Chloride: 99 mEq/L (ref 96–112)
Creatinine, Ser: 1.06 mg/dL (ref 0.40–1.20)
GFR: 56.7 mL/min — ABNORMAL LOW (ref >60.00)
Glucose, Bld: 92 mg/dL (ref 70–99)
Potassium: 3.9 mEq/L (ref 3.5–5.1)
Sodium: 137 mEq/L (ref 135–145)
Total Bilirubin: 0.5 mg/dL (ref 0.2–1.2)
Total Protein: 6.4 g/dL (ref 6.0–8.3)

## 2022-04-22 LAB — LIPID PANEL
Cholesterol: 222 mg/dL — ABNORMAL HIGH (ref 0–200)
HDL: 45.2 mg/dL (ref >39.00)
LDL Cholesterol: 151 mg/dL — ABNORMAL HIGH (ref 0–99)
NonHDL: 176.72
Total CHOL/HDL Ratio: 5
Triglycerides: 127 mg/dL (ref 0.0–149.0)
VLDL: 25.4 mg/dL (ref 0.0–40.0)

## 2022-04-22 LAB — LDL CHOLESTEROL, DIRECT: Direct LDL: 152 mg/dL

## 2022-04-25 ENCOUNTER — Encounter: Payer: Self-pay | Admitting: Family Medicine

## 2022-04-25 ENCOUNTER — Ambulatory Visit (INDEPENDENT_AMBULATORY_CARE_PROVIDER_SITE_OTHER): Payer: Commercial Managed Care - HMO | Admitting: Family Medicine

## 2022-04-25 VITALS — BP 110/70 | HR 79 | Temp 98.4°F | Ht 68.0 in | Wt 159.0 lb

## 2022-04-25 DIAGNOSIS — G4709 Other insomnia: Secondary | ICD-10-CM

## 2022-04-25 DIAGNOSIS — F419 Anxiety disorder, unspecified: Secondary | ICD-10-CM | POA: Diagnosis not present

## 2022-04-25 DIAGNOSIS — E785 Hyperlipidemia, unspecified: Secondary | ICD-10-CM | POA: Diagnosis not present

## 2022-04-25 DIAGNOSIS — R944 Abnormal results of kidney function studies: Secondary | ICD-10-CM | POA: Diagnosis not present

## 2022-04-25 LAB — POCT URINALYSIS DIPSTICK
Bilirubin, UA: NEGATIVE
Blood, UA: NEGATIVE
Glucose, UA: NEGATIVE
Ketones, UA: NEGATIVE
Leukocytes, UA: NEGATIVE
Nitrite, UA: NEGATIVE
Protein, UA: NEGATIVE
Spec Grav, UA: 1.015 (ref 1.010–1.025)
Urobilinogen, UA: NEGATIVE E.U./dL — AB
pH, UA: 8 (ref 5.0–8.0)

## 2022-04-25 MED ORDER — TRAZODONE HCL 50 MG PO TABS: 25.0000 mg | ORAL_TABLET | Freq: Every evening | ORAL | 3 refills | Status: DC | PRN

## 2022-04-25 NOTE — Assessment & Plan Note (Signed)
No indication for statin therapy at this time.  She will continue to monitor her diet and we will continue to periodically check her cholesterol.

## 2022-04-25 NOTE — Progress Notes (Signed)
Tommi Rumps, MD Phone: [phone]  Lisa Gonzalez is a 61 y.o. female who presents today for f/u.  HLD: LDL has trended down some.  She did a limiting garlic mixture to help with this.  She is also been limiting red meat.  The 10-year ASCVD risk score (Arnett DK, et al., 2019) is: 3.3%   Values used to calculate the score:     Age: 51 years     Sex: Female     Is Non-Hispanic African American: No     Diabetic: No     Tobacco smoker: No     Systolic Blood Pressure: 009 mmHg     Is BP treated: No     HDL Cholesterol: 45.2 mg/dL     Total Cholesterol: 222 mg/dL  Insomnia: At times has trouble sleeping on days where she works.  She listens to peoples complaints until about 8 PM and has trouble shutting her mind off.  She notes some anxiety and stress.  Trazodone has been beneficial and Lunesta has been beneficial.  Other medications tried have not been helpful.  Reduced GFR: This has been present on the last 2 lab test.  No NSAIDs.  She does take several supplements and she has been doing this for over a year.  She does report her mom and uncle have kidney disease.  Social History   Tobacco Use  Smoking Status Never  Smokeless Tobacco Never    Current Outpatient Medications on File Prior to Visit  Medication Sig Dispense Refill   cyproheptadine (PERIACTIN) 4 MG tablet Take 1 tablet (4 mg total) by mouth at bedtime. 30 tablet 0   estradiol (VIVELLE-DOT) 0.075 MG/24HR Place 1 patch onto the skin 2 (two) times a week. 24 patch 2   eszopiclone 3 MG TABS Take 1 tablet (3 mg total) by mouth at bedtime. Take immediately before bedtime 30 tablet 0   gabapentin (NEURONTIN) 100 MG capsule Take 1 capsule (100 mg total) by mouth 3 (three) times daily. 90 capsule 1   loratadine (CLARITIN) 10 MG tablet Take 10 mg by mouth daily as needed.     methocarbamol (ROBAXIN) 500 MG tablet Take 1 tablet (500 mg total) by mouth 4 (four) times daily. (Patient taking differently: Take 500 mg by  mouth every 8 (eight) hours as needed.) 90 tablet 3   pantoprazole (PROTONIX) 40 MG tablet Take 1 tablet (40 mg total) by mouth daily. 90 tablet 2   SUMAtriptan (IMITREX) 100 MG tablet Take one  tablet for migraine as needed. May repeat in 2 hours if headache persists or recurs.Maximum dose in 200 mg per day. 10 tablet 0   Current Facility-Administered Medications on File Prior to Visit  Medication Dose Route Frequency Provider Last Rate Last Admin   betamethasone acetate-betamethasone sodium phosphate (CELESTONE) injection 3 mg  3 mg Intra-articular Once Edrick Kins, DPM         ROS see history of present illness  Objective  Physical Exam Vitals:   04/25/22 1009  BP: 110/70  Pulse: 79  Temp: 98.4 F (36.9 C)  SpO2: 97%    BP Readings from Last 3 Encounters:  04/25/22 110/70  12/12/21 112/60  09/05/21 100/70   Wt Readings from Last 3 Encounters:  04/25/22 159 lb (72.1 kg)  12/12/21 153 lb 12.8 oz (69.8 kg)  09/05/21 156 lb (70.8 kg)    Physical Exam Constitutional:      General: She is not in acute distress.    Appearance:  She is not diaphoretic.  Cardiovascular:     Rate and Rhythm: Normal rate and regular rhythm.     Heart sounds: Normal heart sounds.  Pulmonary:     Effort: Pulmonary effort is normal.     Breath sounds: Normal breath sounds.  Neurological:     Mental Status: She is alert.      Assessment/Plan: Please see individual problem list.  Problem List Items Addressed This Visit     Anxiety (Chronic)    Patient has anxiety based off of GAD-7 score and her report of symptoms.  Discussed the options of seeing a specialist for this or starting on medication.  She also see the specialist.  Referral placed.      Relevant Medications   traZODone (DESYREL) 50 MG tablet   Hyperlipidemia - Primary (Chronic)    No indication for statin therapy at this time.  She will continue to monitor her diet and we will continue to periodically check her  cholesterol.      Insomnia (Chronic)    Sleep issues may be related to anxiety and stress.  She can alternate trazodone 25-50 mg nightly as needed for sleep with Lunesta 3 mg nightly as needed for sleep.  Trazodone refilled.      Relevant Medications   traZODone (DESYREL) 50 MG tablet   Decreased GFR    Reduced on 2 consecutive labs.  Discussed this could be related to one of her supplements.  She will send me a list of the supplements she is taking.  We can plan on rechecking this at her next visit.  Check urinalysis today.  Discussed the potential for a renal ultrasound in the future if her kidney function remains decreased.  Discussed options of seeing a kidney specialist at some point in the future particularly if kidney function worsens or if she has any protein or blood on her urinalysis.      Relevant Orders   POCT Urinalysis Dipstick    Return for As scheduled.   Tommi Rumps, MD [place]

## 2022-04-25 NOTE — Assessment & Plan Note (Signed)
Sleep issues may be related to anxiety and stress.  She can alternate trazodone 25-50 mg nightly as needed for sleep with Lunesta 3 mg nightly as needed for sleep.  Trazodone refilled.

## 2022-04-25 NOTE — Assessment & Plan Note (Addendum)
Reduced on 2 consecutive labs.  Discussed this could be related to one of her supplements.  She will send me a list of the supplements she is taking.  We can plan on rechecking this at her next visit.  Check urinalysis today.  Discussed the potential for a renal ultrasound in the future if her kidney function remains decreased.  Discussed options of seeing a kidney specialist at some point in the future particularly if kidney function worsens or if she has any protein or blood on her urinalysis.

## 2022-04-25 NOTE — Assessment & Plan Note (Signed)
Patient has anxiety based off of GAD-7 score and her report of symptoms.  Discussed the options of seeing a specialist for this or starting on medication.  She also see the specialist.  Referral placed.

## 2022-04-30 ENCOUNTER — Encounter: Payer: Self-pay | Admitting: Podiatry

## 2022-04-30 ENCOUNTER — Ambulatory Visit (INDEPENDENT_AMBULATORY_CARE_PROVIDER_SITE_OTHER): Payer: Commercial Managed Care - HMO | Admitting: Podiatry

## 2022-04-30 VITALS — BP 107/64 | HR 78

## 2022-04-30 DIAGNOSIS — G5762 Lesion of plantar nerve, left lower limb: Secondary | ICD-10-CM | POA: Diagnosis not present

## 2022-04-30 MED ORDER — BETAMETHASONE SOD PHOS & ACET 6 (3-3) MG/ML IJ SUSP
3.0000 mg | Freq: Once | INTRAMUSCULAR | Status: AC
  Administered 2022-04-30: 3 mg via INTRA_ARTICULAR

## 2022-04-30 NOTE — Progress Notes (Signed)
Chief Complaint  Patient presents with   Neuroma    "I need my handicap sticker extended past December and I need another injection."   HPI: 60 y.o. female presenting today for follow-up evaluation of bilateral forefoot pain.  Patient states that now the pain is developing to the bilateral feet.  She can only wear her custom orthotics for certain period of time before she has to switch shoes.  She has not taking anything currently orally for management.  She presents for further treatment and eval  Past Medical History:  Diagnosis Date   Allergy    Anxiety    Back pain    Cyst of left breast    GERD (gastroesophageal reflux disease)    History of endometriosis    History of ovarian cyst    PTSD (post-traumatic stress disorder)     Past Surgical History:  Procedure Laterality Date   COLONOSCOPY WITH PROPOFOL N/A 03/21/2021   Procedure: COLONOSCOPY WITH PROPOFOL;  Surgeon: Virgel Manifold, MD;  Location: [facility] ENDOSCOPY;  Service: Endoscopy;  Laterality: N/A;   LAPAROSCOPIC VAGINAL HYSTERECTOMY     SPINAL FUSION  2018   SPINE SURGERY      Allergies  Allergen Reactions   Meperidine Swelling and Nausea Only    Blood Pressure Dropped Low BP and pass out.    Hydrocodone Nausea And Vomiting, Swelling and Nausea Only    Hot and cold chills, vomiting, headache, and chest pain  Other reaction(s): Vomiting Hot and cold chills, headache, chest pain   Hydromorphone Nausea And Vomiting and Nausea Only    Other reaction(s): Vomiting   Opium Other (See Comments)    Hot and cold chills, vomiting, headache and chest pain   Oxycodone Nausea And Vomiting and Nausea Only    Other reaction(s): Other (comments) Hot and cold chills, headache, vomiting, and chest pain.      Physical Exam: General: The patient is alert and oriented x3 in no acute distress.  Dermatology: Skin is warm, dry and supple bilateral lower extremities. Negative for open lesions or macerations.  Vascular:  Palpable pedal pulses bilaterally. Capillary refill within normal limits.  Negative for any significant edema or erythema  Neurological: Light touch and protective threshold grossly intact  Musculoskeletal Exam: Slight pain on palpation noted to the second interspace of the left foot with pain with lateral compression of the metatarsal heads.  There is also a splay toe deformity noted on weightbearing and loading of the foot.  Generalized pain throughout palpation of the bilateral forefoot consistent with a global metatarsalgia  Radiographic Exam LT foot 05/15/2021:  Normal osseous mineralization. Joint spaces preserved. No fracture/dislocation/boney destruction.  There is some splay toe deformity between the second and third digits visible on x-ray consistent with the clinical findings of a Morton's neuroma  Assessment: 1.  Morton's neuroma left second interspace 2.  Global metatarsalgia bilateral forefoot   Plan of Care:  1. Patient evaluated.  No NSAIDs due to GI complications.  Patient also states that although she took the Medrol Dosepak she was unable to sleep. 2.  Continue custom molded orthotics 3.  Recommend wide fitting shoes that do not constrict the toebox area 4.  Refill prescription for gabapentin 100 mg TID  5.  Continue topical Voltaren gel as needed 6.  Injection of 0.5 cc Celestone Soluspan injected into the second intermetatarsal space left foot  7.  The patient has now dealt with this metatarsalgia pain for over 1 year now.  MRI  is warranted.  Order placed for MRI toes left foot  8.  Return to clinic after MRI to review results and discuss further treatment options including surgery   Edrick Kins, DPM [facility]  Dr. Edrick Kins, DPM    [address], [place] [address]                Office [phone]  Fax [phone]

## 2022-05-04 ENCOUNTER — Encounter: Payer: Self-pay | Admitting: Family Medicine

## 2022-05-07 ENCOUNTER — Encounter: Payer: Self-pay | Admitting: Podiatry

## 2022-05-07 MED ORDER — GABAPENTIN 100 MG PO CAPS: 100.0000 mg | ORAL_CAPSULE | Freq: Three times a day (TID) | ORAL | 1 refills | Status: DC

## 2022-05-07 NOTE — Addendum Note (Signed)
Addended by: Edrick Kins on: 05/07/2022 05:34 PM   Modules accepted: Orders

## 2022-05-07 NOTE — Telephone Encounter (Signed)
Referral has been placed. 

## 2022-05-08 ENCOUNTER — Ambulatory Visit: Payer: Commercial Managed Care - HMO | Admitting: Anesthesiology

## 2022-05-08 ENCOUNTER — Encounter
Admission: RE | Disposition: A | Discharge status: Home / Self Care | Payer: Self-pay | Source: Home / Self Care | Attending: Gastroenterology

## 2022-05-08 ENCOUNTER — Other Ambulatory Visit: Payer: Self-pay

## 2022-05-08 ENCOUNTER — Encounter: Payer: Self-pay | Admitting: Gastroenterology

## 2022-05-08 ENCOUNTER — Ambulatory Visit
Admission: RE | Admit: 2022-05-08 | Discharge: 2022-05-08 | Disposition: A | Discharge status: Home / Self Care | Payer: Commercial Managed Care - HMO | Attending: Gastroenterology | Admitting: Gastroenterology

## 2022-05-08 DIAGNOSIS — F419 Anxiety disorder, unspecified: Secondary | ICD-10-CM | POA: Diagnosis not present

## 2022-05-08 DIAGNOSIS — F4310 Post-traumatic stress disorder, unspecified: Secondary | ICD-10-CM | POA: Insufficient documentation

## 2022-05-08 DIAGNOSIS — Z800 Family history of malignant neoplasm of digestive organs: Secondary | ICD-10-CM | POA: Diagnosis not present

## 2022-05-08 DIAGNOSIS — K635 Polyp of colon: Secondary | ICD-10-CM | POA: Diagnosis not present

## 2022-05-08 DIAGNOSIS — Z86010 Personal history of colonic polyps: Secondary | ICD-10-CM | POA: Diagnosis present

## 2022-05-08 DIAGNOSIS — Z79899 Other long term (current) drug therapy: Secondary | ICD-10-CM | POA: Insufficient documentation

## 2022-05-08 DIAGNOSIS — K219 Gastro-esophageal reflux disease without esophagitis: Secondary | ICD-10-CM | POA: Diagnosis not present

## 2022-05-08 DIAGNOSIS — D122 Benign neoplasm of ascending colon: Secondary | ICD-10-CM | POA: Diagnosis not present

## 2022-05-08 DIAGNOSIS — K5730 Diverticulosis of large intestine without perforation or abscess without bleeding: Secondary | ICD-10-CM | POA: Diagnosis not present

## 2022-05-08 DIAGNOSIS — Z1211 Encounter for screening for malignant neoplasm of colon: Secondary | ICD-10-CM | POA: Diagnosis not present

## 2022-05-08 HISTORY — PX: COLONOSCOPY WITH PROPOFOL: SHX5780

## 2022-05-08 SURGERY — COLONOSCOPY WITH PROPOFOL
Anesthesia: General

## 2022-05-08 MED ORDER — PROPOFOL 10 MG/ML IV BOLUS
INTRAVENOUS | Status: DC | PRN
  Administered 2022-05-08: 100 mg via INTRAVENOUS

## 2022-05-08 MED ORDER — SODIUM CHLORIDE 0.9 % IV SOLN: INTRAVENOUS | Status: DC

## 2022-05-08 MED ORDER — LIDOCAINE HCL (CARDIAC) PF 100 MG/5ML IV SOSY
PREFILLED_SYRINGE | INTRAVENOUS | Status: DC | PRN
  Administered 2022-05-08: 100 mg via INTRAVENOUS

## 2022-05-08 MED ORDER — PROPOFOL 500 MG/50ML IV EMUL
INTRAVENOUS | Status: DC | PRN
  Administered 2022-05-08: 120 ug/kg/min via INTRAVENOUS

## 2022-05-08 NOTE — Anesthesia Postprocedure Evaluation (Signed)
Anesthesia Post Note  Patient: Lisa Gonzalez  Procedure(s) Performed: COLONOSCOPY WITH PROPOFOL  Patient location during evaluation: Endoscopy Anesthesia Type: General Level of consciousness: awake and alert Pain management: pain level controlled Vital Signs Assessment: post-procedure vital signs reviewed and stable Respiratory status: spontaneous breathing, nonlabored ventilation, respiratory function stable and patient connected to nasal cannula oxygen Cardiovascular status: blood pressure returned to baseline and stable Postop Assessment: no apparent nausea or vomiting Anesthetic complications: no   No notable events documented.   Last Vitals:  Vitals:   05/08/22 1131 05/08/22 1141  BP: 93/68 92/62  Pulse: 64 61  Resp: 17 16  Temp:    SpO2: 99% 100%    Last Pain:  Vitals:   05/08/22 1141  TempSrc:   PainSc: 0-No pain                 Arita Miss

## 2022-05-08 NOTE — Op Note (Signed)
[facility] Gastroenterology Patient Name: Lisa Gonzalez Procedure Date: 05/08/2022 10:47 AM MRN: [id number] Account #: [id number] Date of Birth: 26-Mar-1961 Admit Type: Outpatient Age: 61 Room: [facility] ENDO ROOM 4 Gender: Female Note Status: Finalized Instrument Name: Jasper Riling [id number] Procedure:             Colonoscopy Indications:           Surveillance: History of piecemeal removal adenoma on                         last colonoscopy (< 3 yrs), Last colonoscopy: October                         2022 Providers:             Lin Landsman MD, MD Referring MD:          Angela Adam. Caryl Bis (Referring MD) Medicines:             General Anesthesia Complications:         No immediate complications. Estimated blood loss: None. Procedure:             Pre-Anesthesia Assessment:                        - Prior to the procedure, a History and Physical was                         performed, and patient medications and allergies were                         reviewed. The patient is competent. The risks and                         benefits of the procedure and the sedation options and                         risks were discussed with the patient. All questions                         were answered and informed consent was obtained.                         Patient identification and proposed procedure were                         verified by the physician, the nurse, the                         anesthesiologist, the anesthetist and the technician                         in the pre-procedure area in the procedure room in the                         endoscopy suite. Mental Status Examination: alert and                         oriented. Airway Examination: normal oropharyngeal  airway and neck mobility. Respiratory Examination:                         clear to auscultation. CV Examination: normal.                         Prophylactic Antibiotics: The  patient does not require                         prophylactic antibiotics. Prior Anticoagulants: The                         patient has taken no anticoagulant or antiplatelet                         agents. ASA Grade Assessment: II - A patient with mild                         systemic disease. After reviewing the risks and                         benefits, the patient was deemed in satisfactory                         condition to undergo the procedure. The anesthesia                         plan was to use general anesthesia. Immediately prior                         to administration of medications, the patient was                         re-assessed for adequacy to receive sedatives. The                         heart rate, respiratory rate, oxygen saturations,                         blood pressure, adequacy of pulmonary ventilation, and                         response to care were monitored throughout the                         procedure. The physical status of the patient was                         re-assessed after the procedure.                        After obtaining informed consent, the colonoscope was                         passed under direct vision. Throughout the procedure,                         the patient's blood pressure, pulse, and oxygen  saturations were monitored continuously. The                         Colonoscope was introduced through the anus and                         advanced to the the cecum, identified by appendiceal                         orifice and ileocecal valve. The colonoscopy was                         performed without difficulty. The patient tolerated                         the procedure well. The quality of the bowel                         preparation was evaluated using the BBPS [facility] Bowel                         Preparation Scale) with scores of: Right Colon = 3,                         Transverse Colon = 3 and  Left Colon = 3 (entire mucosa                         seen well with no residual staining, small fragments                         of stool or opaque liquid). The total BBPS score                         equals 9. The ileocecal valve, appendiceal orifice,                         and rectum were photographed. Findings:      The perianal and digital rectal examinations were normal. Pertinent       negatives include normal sphincter tone and no palpable rectal lesions.      A 3 mm polyp was found in the ascending colon. The polyp was sessile.       The polyp was removed with a cold snare. Resection was complete, but the       polyp tissue was not retrieved. Estimated blood loss: none.      The retroflexed view of the distal rectum and anal verge was normal and       showed no anal or rectal abnormalities.      Multiple diverticula were found in the recto-sigmoid colon, sigmoid       colon and transverse colon.      The exam was otherwise without abnormality. Impression:            - One 3 mm polyp in the ascending colon, removed with                         a cold snare. Complete resection. Polyp tissue not  retrieved.                        - The distal rectum and anal verge are normal on                         retroflexion view.                        - Diverticulosis in the recto-sigmoid colon, in the                         sigmoid colon and in the transverse colon.                        - The examination was otherwise normal. Recommendation:        - Discharge patient to home (with escort).                        - Resume previous diet today.                        - Continue present medications.                        - Repeat colonoscopy in 5 years for surveillance. Procedure Code(s):     --- Professional ---                        [phone], Colonoscopy, flexible; with removal of                         tumor(s), polyp(s), or other lesion(s) by snare                          technique Diagnosis Code(s):     --- Professional ---                        Z86.010, Personal history of colonic polyps                        D12.2, Benign neoplasm of ascending colon                        K57.30, Diverticulosis of large intestine without                         perforation or abscess without bleeding CPT copyright 2022 American Medical Association. All rights reserved. The codes documented in this report are preliminary and upon coder review may  be revised to meet current compliance requirements. Dr. Ulyess Mort Lin Landsman MD, MD 05/08/2022 11:20:07 AM This report has been signed electronically. Number of Addenda: 0 Note Initiated On: 05/08/2022 10:47 AM Scope Withdrawal Time: 0 hours 12 minutes 1 second  Total Procedure Duration: 0 hours 22 minutes 33 seconds  Estimated Blood Loss:  Estimated blood loss: none.      [facility]

## 2022-05-08 NOTE — Anesthesia Preprocedure Evaluation (Signed)
Anesthesia Evaluation  Patient identified by MRN, date of birth, ID band Patient awake    Reviewed: Allergy & Precautions, NPO status , Patient's Chart, lab work & pertinent test results  History of Anesthesia Complications Negative for: history of anesthetic complications  Airway Mallampati: I  TM Distance: >3 FB Neck ROM: Full    Dental no notable dental hx. (+) Teeth Intact   Pulmonary neg pulmonary ROS, neg sleep apnea, neg COPD, Patient abstained from smoking.Not current smoker   Pulmonary exam normal        Cardiovascular Exercise Tolerance: Good METS(-) hypertension(-) CAD and (-) Past MI negative cardio ROS Normal cardiovascular exam(-) dysrhythmias      Neuro/Psych  PSYCHIATRIC DISORDERS Anxiety     PTSD Neuromuscular disease (h/o cervical radiculopathy)    GI/Hepatic Neg liver ROS,GERD  Controlled and Medicated,,  Endo/Other  negative endocrine ROSneg diabetes    Renal/GU negative Renal ROS  negative genitourinary   Musculoskeletal negative musculoskeletal ROS (+)    Abdominal Normal abdominal exam  (+)   Peds negative pediatric ROS (+)  Hematology negative hematology ROS (+)   Anesthesia Other Findings Past Medical History: No date: Allergy No date: Anxiety No date: Back pain No date: Cyst of left breast No date: GERD (gastroesophageal reflux disease) No date: History of endometriosis No date: History of ovarian cyst No date: PTSD (post-traumatic stress disorder)  Reproductive/Obstetrics negative OB ROS                              Anesthesia Physical Anesthesia Plan  ASA: 2  Anesthesia Plan: General   Post-op Pain Management: Minimal or no pain anticipated   Induction: Intravenous  PONV Risk Score and Plan: 3 and TIVA and Treatment may vary due to age or medical condition  Airway Management Planned: Natural Airway and Nasal Cannula  Additional Equipment:  None  Intra-op Plan:   Post-operative Plan:   Informed Consent: I have reviewed the patients History and Physical, chart, labs and discussed the procedure including the risks, benefits and alternatives for the proposed anesthesia with the patient or authorized representative who has indicated his/her understanding and acceptance.     Dental advisory given  Plan Discussed with: CRNA and Anesthesiologist  Anesthesia Plan Comments: (Discussed risks of anesthesia with patient, including possibility of difficulty with spontaneous ventilation under anesthesia necessitating airway intervention, PONV, and rare risks such as cardiac or respiratory or neurological events, and allergic reactions. Discussed the role of CRNA in patient's perioperative care. Patient understands.)         Anesthesia Quick Evaluation

## 2022-05-08 NOTE — H&P (Signed)
Cephas Darby, MD [address]  [place]  [place], [place] [address]  Main: [phone]  Fax: [phone] Pager: [phone]  Primary Care Physician:  Leone Haven, MD Primary Gastroenterologist:  Dr. Cephas Darby  Pre-Procedure History & Physical: HPI:  Lisa Gonzalez is a 61 y.o. female is here for an colonoscopy.   Past Medical History:  Diagnosis Date   Allergy    Anxiety    Back pain    Cyst of left breast    GERD (gastroesophageal reflux disease)    History of endometriosis    History of ovarian cyst    PTSD (post-traumatic stress disorder)     Past Surgical History:  Procedure Laterality Date   COLONOSCOPY WITH PROPOFOL N/A 03/21/2021   Procedure: COLONOSCOPY WITH PROPOFOL;  Surgeon: Virgel Manifold, MD;  Location: [facility] ENDOSCOPY;  Service: Endoscopy;  Laterality: N/A;   LAPAROSCOPIC VAGINAL HYSTERECTOMY     SPINAL FUSION  2018   SPINE SURGERY      Prior to Admission medications   Medication Sig Start Date End Date Taking? Authorizing Provider  cyproheptadine (PERIACTIN) 4 MG tablet Take 1 tablet (4 mg total) by mouth at bedtime. 6/56/81  Yes Copland, Deirdre Evener, PA-C  estradiol (VIVELLE-DOT) 0.075 MG/24HR Place 1 patch onto the skin 2 (two) times a week. 2/75/17  Yes Copland, Elmo Putt B, PA-C  loratadine (CLARITIN) 10 MG tablet Take 10 mg by mouth daily as needed.   Yes [provider]  pantoprazole (PROTONIX) 40 MG tablet Take 1 tablet (40 mg total) by mouth daily. 08/01/21  Yes Flinchum, Kelby Aline, FNP  SUMAtriptan (IMITREX) 100 MG tablet Take one  tablet for migraine as needed. May repeat in 2 hours if headache persists or recurs.Maximum dose in 200 mg per day. 09/27/21  Yes Crecencio Mc, MD  traZODone (DESYREL) 50 MG tablet Take 0.5-1 tablets (25-50 mg total) by mouth at bedtime as needed for sleep. 04/25/22  Yes Leone Haven, MD  eszopiclone 3 MG TABS Take 1 tablet (3 mg total) by mouth at bedtime. Take immediately before  bedtime 04/11/22   Leone Haven, MD  gabapentin (NEURONTIN) 100 MG capsule Take 1 capsule (100 mg total) by mouth 3 (three) times daily. Patient not taking: Reported on 05/08/2022 05/07/22   Edrick Kins, DPM  methocarbamol (ROBAXIN) 500 MG tablet Take 1 tablet (500 mg total) by mouth 4 (four) times daily. Patient taking differently: Take 500 mg by mouth every 8 (eight) hours as needed. 09/08/20   Flinchum, Kelby Aline, FNP    Allergies as of 04/16/2022 - Review Complete 04/16/2022  Allergen Reaction Noted   Meperidine Swelling and Nausea Only 06/13/2014   Hydrocodone Nausea And Vomiting, Swelling, and Nausea Only 06/13/2014   Hydromorphone Nausea And Vomiting and Nausea Only 11/04/2014   Opium Other (See Comments) 06/13/2014   Oxycodone Nausea And Vomiting and Nausea Only 06/13/2014    Family History  Problem Relation Age of Onset   Autoimmune disease Mother    Heart attack Father 33   Cancer - Colon Maternal Grandmother    Alzheimer's disease Maternal Grandmother    Heart attack Maternal Grandfather    Bone cancer Paternal Grandmother     Social History   Socioeconomic History   Marital status: Married    Spouse name: Not on file   Number of children: Not on file   Years of education: Not on file   Highest education level: Not on file  Occupational  History   Not on file  Tobacco Use   Smoking status: Never   Smokeless tobacco: Never  Vaping Use   Vaping Use: Never used  Substance and Sexual Activity   Alcohol use: Yes    Alcohol/week: 1.0 standard drink of alcohol    Types: 1 Glasses of wine per week    Comment: glass of wine once a week   Drug use: Never   Sexual activity: Yes    Birth control/protection: Surgical    Comment: Hysterectomy  Other Topics Concern   Not on file  Social History Narrative   Not on file   Social Determinants of Health   Financial Resource Strain: Not on file  Food Insecurity: Not on file  Transportation Needs: Not on file   Physical Activity: Not on file  Stress: Not on file  Social Connections: Not on file  Intimate Partner Violence: Not on file    Review of Systems: See HPI, otherwise negative ROS  Physical Exam: BP 120/73   Pulse 74   Temp (!) 96.3 F (35.7 C) (Temporal)   Resp 18   Ht '5\' 8"'$  (1.727 m)   Wt 68.9 kg   SpO2 100%   BMI 23.11 kg/m  General:   Alert,  pleasant and cooperative in NAD Head:  Normocephalic and atraumatic. Neck:  Supple; no masses or thyromegaly. Lungs:  Clear throughout to auscultation.    Heart:  Regular rate and rhythm. Abdomen:  Soft, nontender and nondistended. Normal bowel sounds, without guarding, and without rebound.   Neurologic:  Alert and  oriented x4;  grossly normal neurologically.  Impression/Plan: Lisa Gonzalez is here for an colonoscopy to be performed for h/o colon polyps  Risks, benefits, limitations, and alternatives regarding  colonoscopy have been reviewed with the patient.  Questions have been answered.  All parties agreeable.   Sherri Sear, MD  05/08/2022, 10:36 AM

## 2022-05-08 NOTE — Transfer of Care (Signed)
Immediate Anesthesia Transfer of Care Note  Patient: Lisa Gonzalez  Procedure(s) Performed: COLONOSCOPY WITH PROPOFOL  Patient Location: PACU and Endoscopy Unit  Anesthesia Type:General  Level of Consciousness: drowsy and patient cooperative  Airway & Oxygen Therapy: Patient Spontanous Breathing  Post-op Assessment: Report given to RN and Post -op Vital signs reviewed and stable  Post vital signs: Reviewed and stable  Last Vitals:  Vitals Value Taken Time  BP 92/64 05/08/22 1122  Temp 36.2 C 05/08/22 1121  Pulse 65 05/08/22 1122  Resp 17 05/08/22 1122  SpO2 100 % 05/08/22 1122  Vitals shown include unvalidated device data.  Last Pain:  Vitals:   05/08/22 1121  TempSrc: Temporal  PainSc: Asleep         Complications: No notable events documented.

## 2022-05-09 ENCOUNTER — Encounter: Payer: Self-pay | Admitting: Gastroenterology

## 2022-05-09 ENCOUNTER — Encounter: Payer: Self-pay | Admitting: Family Medicine

## 2022-05-12 ENCOUNTER — Encounter: Payer: Self-pay | Admitting: Family Medicine

## 2022-05-12 DIAGNOSIS — K219 Gastro-esophageal reflux disease without esophagitis: Secondary | ICD-10-CM

## 2022-05-14 ENCOUNTER — Ambulatory Visit
Admission: RE | Admit: 2022-05-14 | Discharge: 2022-05-14 | Disposition: A | Discharge status: Home / Self Care | Payer: Commercial Managed Care - HMO | Source: Ambulatory Visit | Attending: Podiatry | Admitting: Podiatry

## 2022-05-14 ENCOUNTER — Other Ambulatory Visit: Payer: Self-pay | Admitting: Podiatry

## 2022-05-14 DIAGNOSIS — G5762 Lesion of plantar nerve, left lower limb: Secondary | ICD-10-CM

## 2022-05-14 MED ORDER — GADOBENATE DIMEGLUMINE 529 MG/ML IV SOLN
14.0000 mL | Freq: Once | INTRAVENOUS | Status: AC | PRN
  Administered 2022-05-14: 14 mL via INTRAVENOUS

## 2022-05-14 MED ORDER — PANTOPRAZOLE SODIUM 40 MG PO TBEC: 40.0000 mg | DELAYED_RELEASE_TABLET | Freq: Every day | ORAL | 2 refills | Status: DC

## 2022-05-19 ENCOUNTER — Other Ambulatory Visit: Payer: Self-pay | Admitting: Family Medicine

## 2022-05-19 DIAGNOSIS — G4709 Other insomnia: Secondary | ICD-10-CM

## 2022-05-23 NOTE — Progress Notes (Signed)
Please contact patient for follow-up appointment to review MRI results and discuss further treatment options.  Thank so much, Dr. Amalia Hailey

## 2022-05-24 ENCOUNTER — Telehealth: Payer: Self-pay | Admitting: Podiatry

## 2022-05-24 NOTE — Telephone Encounter (Signed)
Left voicemail for pt to callback and schedule a follow up MRI results appt with Dr. Amalia Hailey - [facility].

## 2022-05-30 ENCOUNTER — Other Ambulatory Visit: Payer: Commercial Managed Care - HMO

## 2022-06-19 ENCOUNTER — Ambulatory Visit: Payer: Commercial Managed Care - HMO | Admitting: Family Medicine

## 2022-06-28 ENCOUNTER — Ambulatory Visit: Payer: 59 | Admitting: Family Medicine

## 2022-06-28 ENCOUNTER — Encounter: Payer: Self-pay | Admitting: Family Medicine

## 2022-06-28 VITALS — BP 100/60 | HR 65 | Temp 98.0°F | Ht 68.0 in | Wt 159.0 lb

## 2022-06-28 DIAGNOSIS — M542 Cervicalgia: Secondary | ICD-10-CM | POA: Diagnosis not present

## 2022-06-28 DIAGNOSIS — G4709 Other insomnia: Secondary | ICD-10-CM | POA: Diagnosis not present

## 2022-06-28 DIAGNOSIS — G5782 Other specified mononeuropathies of left lower limb: Secondary | ICD-10-CM

## 2022-06-28 DIAGNOSIS — R944 Abnormal results of kidney function studies: Secondary | ICD-10-CM

## 2022-06-28 DIAGNOSIS — M79671 Pain in right foot: Secondary | ICD-10-CM | POA: Diagnosis not present

## 2022-06-28 DIAGNOSIS — M79672 Pain in left foot: Secondary | ICD-10-CM | POA: Diagnosis not present

## 2022-06-28 NOTE — Assessment & Plan Note (Addendum)
Could be related to previous workout while she had a bulging disc. Patient is requesting to be referred to PT [facility] to strengthen the muscles around her neck. Referral placed.

## 2022-06-28 NOTE — Progress Notes (Unsigned)
Tommi Rumps, MD Phone: [phone]  Lisa Gonzalez is a 62 y.o. female who presents today for f/u.  Insomnia: patient is currently taking 25-'50mg'$  trazodone as needed or Lunesta '3mg'$  as needed at night for sleep. Patient decides which to take based on if she's going to work the next day. She reports that Johnnye Sima works faster for helping her fall asleep, but will take trazodone more frequently because she knows she can build a resistance to the [place]. She averages 7 hours of sleep at night according to her fitbit. She denies any excessive daytime drowsiness.   Reduced GFR: patient currently has no new dysuria, urgency, frequency, blood in urine, or frothy urine. She typically stays well-hydrated. She reports a family history of kidney dysfunction.   Neck pain: patient has had neck pain and tightness since working with her physical trainer and had to stop due to the neck pain. She would like to see physical therapy, specifically Larina Bras, to see if she can strengthen the muscles around her neck.  Feet pain: patient has seen podiatry and was told she has a neuroma on her left foot. The pain is mostly located at her toes and the sole of her foot. She has been limiting her activity and only gets about 2000 steps a day due to the pain. She reports that recently her other foot has also started hurting. The pain exacerbates with activity and typically is least painful right when she wakes up. She has her handicap paperwork currently managed by podiatry. She will also see an orthopedist in the future to see if surgery is an option.     Social History   Tobacco Use  Smoking Status Never  Smokeless Tobacco Never    Current Outpatient Medications on File Prior to Visit  Medication Sig Dispense Refill   cyproheptadine (PERIACTIN) 4 MG tablet Take 1 tablet (4 mg total) by mouth at bedtime. 30 tablet 0   estradiol (VIVELLE-DOT) 0.075 MG/24HR Place 1 patch onto the skin 2 (two) times a week. 24  patch 2   eszopiclone 3 MG TABS Take 1 tablet (3 mg total) by mouth at bedtime. Take immediately before bedtime 30 tablet 0   loratadine (CLARITIN) 10 MG tablet Take 10 mg by mouth daily as needed.     methocarbamol (ROBAXIN) 500 MG tablet Take 1 tablet (500 mg total) by mouth 4 (four) times daily. (Patient taking differently: Take 500 mg by mouth every 8 (eight) hours as needed.) 90 tablet 3   pantoprazole (PROTONIX) 40 MG tablet Take 1 tablet (40 mg total) by mouth daily. 90 tablet 2   SUMAtriptan (IMITREX) 100 MG tablet Take one  tablet for migraine as needed. May repeat in 2 hours if headache persists or recurs.Maximum dose in 200 mg per day. 10 tablet 0   traZODone (DESYREL) 50 MG tablet TAKE 0.5-1 TABLETS BY MOUTH AT BEDTIME AS NEEDED FOR SLEEP. 90 tablet 2   Current Facility-Administered Medications on File Prior to Visit  Medication Dose Route Frequency Provider Last Rate Last Admin   betamethasone acetate-betamethasone sodium phosphate (CELESTONE) injection 3 mg  3 mg Intra-articular Once Edrick Kins, DPM         ROS see history of present illness  Objective  Physical Exam Vitals:   06/28/22 1338  BP: 100/60  Pulse: 65  Temp: 98 F (36.7 C)  SpO2: 99%    BP Readings from Last 3 Encounters:  06/28/22 100/60  05/08/22 92/62  04/30/22 107/64  Wt Readings from Last 3 Encounters:  06/28/22 159 lb (72.1 kg)  05/08/22 152 lb (68.9 kg)  04/25/22 159 lb (72.1 kg)    Physical Exam Constitutional:      Appearance: Normal appearance.  HENT:     Head: Normocephalic and atraumatic.  Cardiovascular:     Rate and Rhythm: Normal rate and regular rhythm.  Pulmonary:     Effort: Pulmonary effort is normal.     Breath sounds: Normal breath sounds.  Feet:     Right foot:     Skin integrity: Skin integrity normal. No erythema.     Left foot:     Skin integrity: Skin integrity normal. No erythema.     Comments: Bilateral widening space between 3rd and 4th toes worse on the  left. Tender to palpation on toes bilaterally when foot is squeezed. Loss of transverse arches bilaterally. No wounds, erythema, or swelling in either foot.  Skin:    General: Skin is warm and dry.  Neurological:     Mental Status: She is alert.      Assessment/Plan: Please see individual problem list.  Problem List Items Addressed This Visit     Decreased GFR (Chronic)    No new symptoms. We can plan on rechecking her kidney function at her next visit.      Insomnia (Chronic)    Improving. She can alternate trazodone 25-50 mg nightly as needed for sleep with Lunesta 3 mg nightly as needed for sleep.      Neck pain - Primary    Could be related to previous workout while she had a bulging disc. Patient is requesting to be referred to PT [facility] to strengthen the muscles around her neck. Referral placed.      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 Patient will see her orthopedist to assess candidacy for surgical intervention. We will place a referral for sports medicine to help with her feet pain.       Other Visit Diagnoses     Pain in both feet       Relevant Orders   Ambulatory referral to Sports Medicine       Return in about 6 months (around 12/27/2022).   Marisa Cyphers, Medical Student [place]

## 2022-06-28 NOTE — Assessment & Plan Note (Addendum)
No new symptoms. We can plan on rechecking her kidney function at her next visit.

## 2022-06-28 NOTE — Assessment & Plan Note (Signed)
Improving. She can alternate trazodone 25-50 mg nightly as needed for sleep with Lunesta 3 mg nightly as needed for sleep.

## 2022-06-28 NOTE — Assessment & Plan Note (Signed)
Patient has a left neuroma. Patient will see her orthopedist to assess candidacy for surgical intervention. We will place a referral for sports medicine to help with her feet pain.

## 2022-07-01 NOTE — Progress Notes (Signed)
Sent to [employer] pool for scheduling

## 2022-07-01 NOTE — Progress Notes (Signed)
Patient seen along with medical student Zada Zhong.  I personally evaluated this patient along with the student, and verified all aspects of the history, physical exam, and medical decision making as documented by the student.  I agree with the student's documentation and have made all necessary edits.  Dara Camargo, MD  

## 2022-07-01 NOTE — Patient Instructions (Signed)
Please let me know if you do not hear from PT or sports medicine in the next 2 weeks.

## 2022-07-02 NOTE — Progress Notes (Unsigned)
   I, Peterson Lombard, LAT, ATC acting as a scribe for Lynne Leader, MD.  Subjective:    CC: Bilateral feet pain  HPI: Patient is a 62 year old female presenting with bilateral feet pain.  Patient has been seen previously by podiatry, and diagnosed with Morton's neuroma in her left foot.  Patient locates pain to?  Dx imaging: 05/15/2021 left foot x-ray  05/14/2022 left foot MRI    Pertinent review of Systems: ***  Relevant historical information: ***   Objective:   There were no vitals filed for this visit. General: Well Developed, well nourished, and in no acute distress.   MSK: ***  Lab and Radiology Results No results found for this or any previous visit (from the past 72 hour(s)). No results found.    Impression and Recommendations:    Assessment and Plan: 62 y.o. female with ***.  PDMP not reviewed this encounter. No orders of the defined types were placed in this encounter.  No orders of the defined types were placed in this encounter.   Discussed warning signs or symptoms. Please see discharge instructions. Patient expresses understanding.   ***

## 2022-07-03 ENCOUNTER — Telehealth: Payer: Self-pay

## 2022-07-03 ENCOUNTER — Ambulatory Visit: Payer: Self-pay

## 2022-07-03 ENCOUNTER — Ambulatory Visit: Payer: 59 | Admitting: Family Medicine

## 2022-07-03 VITALS — BP 110/78 | HR 87 | Ht 68.0 in | Wt 161.0 lb

## 2022-07-03 DIAGNOSIS — G5762 Lesion of plantar nerve, left lower limb: Secondary | ICD-10-CM | POA: Diagnosis not present

## 2022-07-03 DIAGNOSIS — M79671 Pain in right foot: Secondary | ICD-10-CM | POA: Diagnosis not present

## 2022-07-03 DIAGNOSIS — M79672 Pain in left foot: Secondary | ICD-10-CM

## 2022-07-03 DIAGNOSIS — M6702 Short Achilles tendon (acquired), left ankle: Secondary | ICD-10-CM | POA: Diagnosis not present

## 2022-07-03 NOTE — Patient Instructions (Addendum)
Thank you for coming in today.   Try adding a metatarsal pad to the bottom of the insole.   Play around with gabapentin.  We could try lyrica instead.   Next step is a nerve conduction study.   Let me know via mychart when or if I should order it.

## 2022-07-04 NOTE — Telephone Encounter (Signed)
Okay to provide MRI to [facility]. Not sure what [employer] is but if you can get more details let me know if I need to do something on my end. - Dr. Amalia Hailey

## 2022-07-08 ENCOUNTER — Other Ambulatory Visit: Payer: Self-pay | Admitting: Family Medicine

## 2022-07-08 DIAGNOSIS — F5101 Primary insomnia: Secondary | ICD-10-CM

## 2022-07-08 NOTE — Telephone Encounter (Signed)
Refilled: 04/11/2022 Last OV: 06/28/2022 Next OV: not scheduled

## 2022-07-11 DIAGNOSIS — M25551 Pain in right hip: Secondary | ICD-10-CM | POA: Diagnosis not present

## 2022-07-11 DIAGNOSIS — M25651 Stiffness of right hip, not elsewhere classified: Secondary | ICD-10-CM | POA: Diagnosis not present

## 2022-07-12 ENCOUNTER — Other Ambulatory Visit: Payer: Self-pay | Admitting: Family Medicine

## 2022-07-12 ENCOUNTER — Other Ambulatory Visit: Payer: Self-pay | Admitting: Internal Medicine

## 2022-07-12 ENCOUNTER — Encounter: Payer: Self-pay | Admitting: Podiatry

## 2022-07-12 DIAGNOSIS — F5101 Primary insomnia: Secondary | ICD-10-CM

## 2022-07-12 DIAGNOSIS — Z8669 Personal history of other diseases of the nervous system and sense organs: Secondary | ICD-10-CM

## 2022-07-15 ENCOUNTER — Telehealth: Payer: Self-pay

## 2022-07-15 ENCOUNTER — Encounter: Payer: Self-pay | Admitting: Family Medicine

## 2022-07-15 DIAGNOSIS — M62838 Other muscle spasm: Secondary | ICD-10-CM

## 2022-07-15 DIAGNOSIS — F5101 Primary insomnia: Secondary | ICD-10-CM

## 2022-07-15 MED ORDER — SUMATRIPTAN SUCCINATE 100 MG PO TABS: ORAL_TABLET | ORAL | 0 refills | Status: DC

## 2022-07-15 NOTE — Telephone Encounter (Signed)
Sent to dr Caryl Bis for approvla

## 2022-07-15 NOTE — Telephone Encounter (Signed)
Please contact the patient and see what she is requesting this for.  It looks like it has not been filled in over a year.

## 2022-07-16 DIAGNOSIS — M25651 Stiffness of right hip, not elsewhere classified: Secondary | ICD-10-CM | POA: Diagnosis not present

## 2022-07-16 DIAGNOSIS — M25551 Pain in right hip: Secondary | ICD-10-CM | POA: Diagnosis not present

## 2022-07-16 NOTE — Telephone Encounter (Signed)
LM FOR PT TO [facility] 

## 2022-07-18 DIAGNOSIS — M25551 Pain in right hip: Secondary | ICD-10-CM | POA: Diagnosis not present

## 2022-07-18 DIAGNOSIS — M25651 Stiffness of right hip, not elsewhere classified: Secondary | ICD-10-CM | POA: Diagnosis not present

## 2022-07-23 DIAGNOSIS — M25651 Stiffness of right hip, not elsewhere classified: Secondary | ICD-10-CM | POA: Diagnosis not present

## 2022-07-23 DIAGNOSIS — M25551 Pain in right hip: Secondary | ICD-10-CM | POA: Diagnosis not present

## 2022-07-25 DIAGNOSIS — M25551 Pain in right hip: Secondary | ICD-10-CM | POA: Diagnosis not present

## 2022-07-25 DIAGNOSIS — M25651 Stiffness of right hip, not elsewhere classified: Secondary | ICD-10-CM | POA: Diagnosis not present

## 2022-08-01 DIAGNOSIS — M542 Cervicalgia: Secondary | ICD-10-CM | POA: Diagnosis not present

## 2022-08-06 DIAGNOSIS — M542 Cervicalgia: Secondary | ICD-10-CM | POA: Diagnosis not present

## 2022-08-07 MED ORDER — ESZOPICLONE 3 MG PO TABS: ORAL_TABLET | ORAL | 0 refills | Status: DC

## 2022-08-07 MED ORDER — METHOCARBAMOL 500 MG PO TABS: 500.0000 mg | ORAL_TABLET | Freq: Three times a day (TID) | ORAL | 0 refills | Status: DC | PRN

## 2022-08-07 NOTE — Telephone Encounter (Signed)
Noted. Refill(s) sent to pharmacy.  

## 2022-08-07 NOTE — Telephone Encounter (Signed)
Pt is returning a call regarding her medication for robaxin and lunesta. Pt stated trazodone is not working for her and pt has ongoing issues with her hip because she sits to long so that is the reason for robaxin

## 2022-08-07 NOTE — Addendum Note (Signed)
Addended by: Leone Haven on: 08/07/2022 03:48 PM   Modules accepted: Orders

## 2022-08-08 DIAGNOSIS — M25551 Pain in right hip: Secondary | ICD-10-CM | POA: Diagnosis not present

## 2022-08-08 DIAGNOSIS — M25651 Stiffness of right hip, not elsewhere classified: Secondary | ICD-10-CM | POA: Diagnosis not present

## 2022-08-09 ENCOUNTER — Ambulatory Visit: Payer: 59 | Admitting: Nurse Practitioner

## 2022-08-09 ENCOUNTER — Encounter: Payer: Self-pay | Admitting: Nurse Practitioner

## 2022-08-09 VITALS — BP 90/60 | HR 70 | Temp 98.1°F | Ht 68.0 in | Wt 155.6 lb

## 2022-08-09 DIAGNOSIS — H9203 Otalgia, bilateral: Secondary | ICD-10-CM

## 2022-08-09 DIAGNOSIS — H9313 Tinnitus, bilateral: Secondary | ICD-10-CM

## 2022-08-09 NOTE — Progress Notes (Signed)
Established Patient Office Visit  Subjective:  Patient ID: Lisa Gonzalez, female    DOB: September 19, 1960  Age: 62 y.o. MRN: [id number]  CC:  Chief Complaint  Patient presents with   Acute Visit    Ear pain/ringin/pressure X 2 weeks    HPI  Lisa Gonzalez presents for ear pain.    Otalgia  There is pain in both ears. This is a new problem. The current episode started 1 to 4 weeks ago. The problem occurs every few minutes. The problem has been unchanged. The pain is moderate (pulsating). Pertinent negatives include no coughing, headaches or sore throat. She has tried nothing for the symptoms.   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   Past Medical History:  Diagnosis Date   Allergy    Anxiety    Back pain    Cyst of left breast    GERD (gastroesophageal reflux disease)    History of endometriosis    History of ovarian cyst    PTSD (post-traumatic stress disorder)     Past Surgical History:  Procedure Laterality Date   COLONOSCOPY WITH PROPOFOL N/A 03/21/2021   Procedure: COLONOSCOPY WITH PROPOFOL;  Surgeon: Virgel Manifold, MD;  Location: [facility] ENDOSCOPY;  Service: Endoscopy;  Laterality: N/A;   COLONOSCOPY WITH PROPOFOL N/A 05/08/2022   Procedure: COLONOSCOPY WITH PROPOFOL;  Surgeon: Lin Landsman, MD;  Location: [facility] ENDOSCOPY;  Service: Gastroenterology;  Laterality: N/A;   LAPAROSCOPIC VAGINAL HYSTERECTOMY     SPINAL FUSION  2018   SPINE SURGERY      Family History  Problem Relation Age of Onset   Autoimmune disease Mother    Heart attack Father 39   Cancer - Colon Maternal Grandmother    Alzheimer's disease Maternal Grandmother    Heart attack Maternal Grandfather    Bone cancer Paternal Grandmother     Social History   Socioeconomic History   Marital status: Married    Spouse name: Not on file   Number of children: Not on file   Years of education: Not on file   Highest education level: Not on file  Occupational History    Not on file  Tobacco Use   Smoking status: Never   Smokeless tobacco: Never  Vaping Use   Vaping Use: Never used  Substance and Sexual Activity   Alcohol use: Yes    Alcohol/week: 1.0 standard drink of alcohol    Types: 1 Glasses of wine per week    Comment: glass of wine once a week   Drug use: Never   Sexual activity: Yes    Birth control/protection: Surgical    Comment: Hysterectomy  Other Topics Concern   Not on file  Social History Narrative   Not on file   Social Determinants of Health   Financial Resource Strain: Not on file  Food Insecurity: Not on file  Transportation Needs: Not on file  Physical Activity: Not on file  Stress: Not on file  Social Connections: Not on file  Intimate Partner Violence: Not on file     Outpatient Medications Prior to Visit  Medication Sig Dispense Refill   estradiol (VIVELLE-DOT) 0.075 MG/24HR Place 1 patch onto the skin 2 (two) times a week. 24 patch 2   Eszopiclone 3 MG TABS TAKE 1 TABLET BY MOUTH AT BEDTIME TAKE IMMEDIATELY BEFORE BEDTIME 30 tablet 0   gabapentin (NEURONTIN) 100 MG capsule      loratadine (CLARITIN) 10 MG tablet Take 10  mg by mouth daily as needed.     methocarbamol (ROBAXIN) 500 MG tablet Take 1 tablet (500 mg total) by mouth every 8 (eight) hours as needed. 15 tablet 0   pantoprazole (PROTONIX) 40 MG tablet Take 1 tablet (40 mg total) by mouth daily. 90 tablet 2   SUMAtriptan (IMITREX) 100 MG tablet Take one  tablet for migraine as needed. May repeat in 2 hours if headache persists or recurs.Maximum dose in 200 mg per day. 10 tablet 0   traZODone (DESYREL) 50 MG tablet TAKE 0.5-1 TABLETS BY MOUTH AT BEDTIME AS NEEDED FOR SLEEP. 90 tablet 2   sertraline (ZOLOFT) 50 MG tablet TK 1 T PO QD UTD     Facility-Administered Medications Prior to Visit  Medication Dose Route Frequency Provider Last Rate Last Admin   betamethasone acetate-betamethasone sodium phosphate (CELESTONE) injection 3 mg  3 mg Intra-articular Once  Edrick Kins, DPM        Allergies  Allergen Reactions   Meperidine Swelling and Nausea Only    Blood Pressure Dropped Low BP and pass out.    Hydrocodone Nausea And Vomiting, Swelling and Nausea Only    Hot and cold chills, vomiting, headache, and chest pain  Other reaction(s): Vomiting Hot and cold chills, headache, chest pain   Hydromorphone Nausea And Vomiting and Nausea Only    Other reaction(s): Vomiting   Opium Other (See Comments)    Hot and cold chills, vomiting, headache and chest pain   Oxycodone Nausea And Vomiting and Nausea Only    Other reaction(s): Other (comments) Hot and cold chills, headache, vomiting, and chest pain.     ROS Review of Systems  Constitutional: Negative.   HENT:  Positive for ear pain and tinnitus. Negative for sore throat.        Pressure  Respiratory:  Negative for cough.   Cardiovascular: Negative.   Neurological:  Negative for headaches.  Psychiatric/Behavioral: Negative.        Objective:    Physical Exam HENT:     Right Ear: External ear normal. A middle ear effusion is present.     Left Ear: External ear normal. A middle ear effusion is present. Tympanic membrane is perforated.     BP 90/60   Pulse 70   Temp 98.1 F (36.7 C) (Oral)   Ht 5\' 8"  (1.727 m)   Wt 155 lb 9.6 oz (70.6 kg)   SpO2 97%   BMI 23.66 kg/m  Wt Readings from Last 3 Encounters:  08/09/22 155 lb 9.6 oz (70.6 kg)  07/03/22 161 lb (73 kg)  06/28/22 159 lb (72.1 kg)     Health Maintenance  Topic Date Due   COVID-19 Vaccine (1) Never done   Hepatitis C Screening  Never done   DTaP/Tdap/Td (1 - Tdap) Never done   INFLUENZA VACCINE  08/25/2022 (Originally 12/25/2021)   Zoster Vaccines- Shingrix (1 of 2) 10/29/2022 (Originally 10/22/1979)   MAMMOGRAM  12/15/2023   COLONOSCOPY (Pts 45-9yrs Insurance coverage will need to be confirmed)  05/09/2027   HIV Screening  Completed   HPV VACCINES  Aged Out   PAP SMEAR-Modifier  Discontinued    There  are no preventive care reminders to display for this patient.  Lab Results  Component Value Date   TSH 2.08 05/18/2021   Lab Results  Component Value Date   WBC 8.5 07/18/2021   HGB 13.1 07/18/2021   HCT 38.9 07/18/2021   MCV 90.8 07/18/2021   PLT 249.0 07/18/2021  Lab Results  Component Value Date   NA 137 04/22/2022   K 3.9 04/22/2022   CO2 29 04/22/2022   GLUCOSE 92 04/22/2022   BUN 23 04/22/2022   CREATININE 1.06 04/22/2022   BILITOT 0.5 04/22/2022   ALKPHOS 51 04/22/2022   AST 15 04/22/2022   ALT 13 04/22/2022   PROT 6.4 04/22/2022   ALBUMIN 4.2 04/22/2022   CALCIUM 8.9 04/22/2022   EGFR 66 09/22/2020   GFR 56.70 (L) 04/22/2022   Lab Results  Component Value Date   CHOL 222 (H) 04/22/2022   Lab Results  Component Value Date   HDL 45.20 04/22/2022   Lab Results  Component Value Date   LDLCALC 151 (H) 04/22/2022   Lab Results  Component Value Date   TRIG 127.0 04/22/2022   Lab Results  Component Value Date   CHOLHDL 5 04/22/2022   Lab Results  Component Value Date   HGBA1C 5.6 08/18/2019      Assessment & Plan:  There are no diagnoses linked to this encounter.  Follow-up: No follow-ups on file.   Theresia Lo, NP

## 2022-08-09 NOTE — Patient Instructions (Signed)
Referral sent to ENT. Apply warm compress for ear pain.

## 2022-08-10 DIAGNOSIS — H9203 Otalgia, bilateral: Secondary | ICD-10-CM | POA: Insufficient documentation

## 2022-08-10 NOTE — Assessment & Plan Note (Signed)
Advised her to use warm compress. Referral sent to ENT patient's preference for Dr. Pryor Ochoa. Hand out provided for supplemental information.

## 2022-08-13 ENCOUNTER — Telehealth: Payer: Self-pay | Admitting: *Deleted

## 2022-08-13 DIAGNOSIS — M25651 Stiffness of right hip, not elsewhere classified: Secondary | ICD-10-CM | POA: Diagnosis not present

## 2022-08-13 DIAGNOSIS — M25551 Pain in right hip: Secondary | ICD-10-CM | POA: Diagnosis not present

## 2022-08-13 NOTE — Telephone Encounter (Signed)
Called Patient to let her know that [facility] does not have her medication and Dr. Caryl Bis recommends calling several other pharmacies to see if they have it and let us know so we can call it in where ever they have it in stock.

## 2022-08-13 NOTE — Telephone Encounter (Signed)
Eszopiclone 3mg  tablets are not available & an alternative has been requested from [facility]

## 2022-08-13 NOTE — Telephone Encounter (Signed)
Please let the patient know that the Lunesta tablets are not available at [facility].  She should try to contact several other pharmacies to see if they have it anywhere else in town and then I can send it there.

## 2022-08-14 ENCOUNTER — Other Ambulatory Visit: Payer: Self-pay | Admitting: Family Medicine

## 2022-08-14 DIAGNOSIS — Z8669 Personal history of other diseases of the nervous system and sense organs: Secondary | ICD-10-CM

## 2022-08-15 DIAGNOSIS — M542 Cervicalgia: Secondary | ICD-10-CM | POA: Diagnosis not present

## 2022-08-15 NOTE — Telephone Encounter (Signed)
Please find out why they need an alternative.

## 2022-08-15 NOTE — Telephone Encounter (Signed)
Pharmacy is requesting an alternative medication

## 2022-08-20 DIAGNOSIS — M542 Cervicalgia: Secondary | ICD-10-CM | POA: Diagnosis not present

## 2022-08-22 NOTE — Telephone Encounter (Signed)
Prescription sent on 08/14/22

## 2022-08-27 DIAGNOSIS — M542 Cervicalgia: Secondary | ICD-10-CM | POA: Diagnosis not present

## 2022-08-27 NOTE — Telephone Encounter (Signed)
Patient called and her pharmacy told her that her provider needed to speak to her insurance company, [place]. She is wanting her Lunesta tablets and SUMAtriptan (IMITREX) 100 MG tablet refilled.

## 2022-08-27 NOTE — Telephone Encounter (Signed)
Have we received any communication from her insurance on these medications not being covered? Typically they will fax [place] something stating this and what the next steps might be.

## 2022-08-28 NOTE — Telephone Encounter (Signed)
Can somebody contact the pharmacy to see what the issue is with getting these medications refilled?

## 2022-08-28 NOTE — Telephone Encounter (Signed)
Please let the patient know it looks like her insurance prefers zaleplon or zolpidem as a sleep medication.  Has she ever tried either of those?  If she did please find out if she had any side effects and find out if they worked or not.  Thanks.

## 2022-08-28 NOTE — Telephone Encounter (Signed)
Patient states that she spoke to [facility] and they state that they will contact us due to the Lunesta needing a prior authorization for 30 tablets. She would like to wait on the Lunesta if possible because she knows it works. Patient has tried Zolpidem and it kept her awake and caused irritability but she does not think she has used the Zaleplon.

## 2022-08-28 NOTE — Telephone Encounter (Signed)
[facility] states [facility] Sumat[facility]an is ready so I let the Patient know and there is an issue with the Johnnye Sima so I asked [facility] to find out what the issue is. Pharmacist states she just faxed [place] the issue on Lunesta.

## 2022-08-28 NOTE — Telephone Encounter (Signed)
I do not have anything from  her Insurance

## 2022-08-28 NOTE — Telephone Encounter (Signed)
Spoke to Pharmacy prescription is ready and they sent the paperwork for the [facility].

## 2022-08-29 NOTE — Telephone Encounter (Signed)
Noted.  Will await contact from her insurance company regarding a PA.

## 2022-09-03 DIAGNOSIS — M542 Cervicalgia: Secondary | ICD-10-CM | POA: Diagnosis not present

## 2022-09-03 DIAGNOSIS — M25551 Pain in right hip: Secondary | ICD-10-CM | POA: Diagnosis not present

## 2022-09-03 DIAGNOSIS — M25651 Stiffness of right hip, not elsewhere classified: Secondary | ICD-10-CM | POA: Diagnosis not present

## 2022-09-04 DIAGNOSIS — F5105 Insomnia due to other mental disorder: Secondary | ICD-10-CM | POA: Diagnosis not present

## 2022-09-04 DIAGNOSIS — F419 Anxiety disorder, unspecified: Secondary | ICD-10-CM | POA: Diagnosis not present

## 2022-09-04 DIAGNOSIS — F99 Mental disorder, not otherwise specified: Secondary | ICD-10-CM | POA: Diagnosis not present

## 2022-09-05 ENCOUNTER — Other Ambulatory Visit (HOSPITAL_COMMUNITY): Payer: Self-pay

## 2022-09-05 ENCOUNTER — Telehealth: Payer: Self-pay

## 2022-09-05 NOTE — Telephone Encounter (Signed)
Let Patient know her medication was approved through next year on 09/05/23

## 2022-09-05 NOTE — Telephone Encounter (Signed)
Patient Advocate Encounter  Prior authorization for Eszopiclone 3MG  tablets submitted and APPROVED on 09-05-2022.  Test billing [facility] and next fill is 09-16-2022  Key [id number] Effective: 09-05-2022 - 09-05-2023

## 2022-09-10 ENCOUNTER — Ambulatory Visit: Payer: 59 | Admitting: Nurse Practitioner

## 2022-09-10 DIAGNOSIS — M549 Dorsalgia, unspecified: Secondary | ICD-10-CM | POA: Diagnosis not present

## 2022-09-10 DIAGNOSIS — M47816 Spondylosis without myelopathy or radiculopathy, lumbar region: Secondary | ICD-10-CM | POA: Diagnosis not present

## 2022-09-10 DIAGNOSIS — R159 Full incontinence of feces: Secondary | ICD-10-CM | POA: Diagnosis not present

## 2022-09-10 DIAGNOSIS — M5136 Other intervertebral disc degeneration, lumbar region: Secondary | ICD-10-CM | POA: Diagnosis not present

## 2022-09-10 DIAGNOSIS — M79604 Pain in right leg: Secondary | ICD-10-CM | POA: Diagnosis not present

## 2022-09-10 DIAGNOSIS — M4326 Fusion of spine, lumbar region: Secondary | ICD-10-CM | POA: Diagnosis not present

## 2022-09-10 DIAGNOSIS — Z981 Arthrodesis status: Secondary | ICD-10-CM | POA: Diagnosis not present

## 2022-09-10 DIAGNOSIS — W1811XA Fall from or off toilet without subsequent striking against object, initial encounter: Secondary | ICD-10-CM | POA: Diagnosis not present

## 2022-09-10 DIAGNOSIS — R32 Unspecified urinary incontinence: Secondary | ICD-10-CM | POA: Diagnosis not present

## 2022-09-10 DIAGNOSIS — N3289 Other specified disorders of bladder: Secondary | ICD-10-CM | POA: Diagnosis not present

## 2022-09-10 DIAGNOSIS — M5126 Other intervertebral disc displacement, lumbar region: Secondary | ICD-10-CM | POA: Diagnosis not present

## 2022-09-10 DIAGNOSIS — M5441 Lumbago with sciatica, right side: Secondary | ICD-10-CM | POA: Diagnosis not present

## 2022-09-11 ENCOUNTER — Inpatient Hospital Stay
Admission: RE | Admit: 2022-09-11 | Discharge: 2022-09-11 | Disposition: A | Discharge status: Home / Self Care | Payer: Self-pay | Source: Ambulatory Visit | Attending: Orthopedic Surgery | Admitting: Orthopedic Surgery

## 2022-09-11 ENCOUNTER — Telehealth: Payer: Self-pay

## 2022-09-11 ENCOUNTER — Other Ambulatory Visit: Payer: Self-pay

## 2022-09-11 DIAGNOSIS — Z049 Encounter for examination and observation for unspecified reason: Secondary | ICD-10-CM

## 2022-09-11 NOTE — Progress Notes (Signed)
Referring Physician:  No referring provider defined for this encounter.  Primary Physician:  Glori Luis, MD  History of Present Illness: 09/12/2022 Lisa Gonzalez is here today with a chief complaint of  right low back pain that radiates into the right leg and foot. She is also complaining of urinary and bowel incontinence which occurred on the day she presented to an outside emergency department for evaluation.  Her bowel and bladder function has since returned to normal.  She has had longstanding pain down her leg ever since she had one of her children.  She has new pain that is different.  She began having pain around her hip approximately 1 year ago, but more recently began having pain that is different and includes pain closer to her back.  When her pain is very bad, she feels like her back is not very stable.  Her pain can be as bad as 10 out of 10 is worst when she reclines.  To achieve some level of comfort, she lays down completely flat with her legs slightly spread with a pillow.  That being said, she still has her sleep impacted by pain.  She has been doing physical therapy without great improvement at this point.  She did have physical therapy in the past where there was concern about the position and flexibility of her sacroiliac joint.  She did report that she is having trouble crossing her legs since this pain exacerbation started. Bowel/Bladder Dysfunction: none  Conservative measures:  Physical therapy: [facility] for hip pain nothing for back pain  Multimodal medical therapy including regular antiinflammatories:  gabapentin, robaxin, oxycodone Injections:  has not received epidural steroid injections  Past Surgery: 2014 microdiscectomy 2018- Fusion l4-l5 Lumbar Fusion in 2018 by Dr. Bobette Mo has no symptoms of cervical myelopathy.  The symptoms are causing a significant impact on the patient's life.   I have utilized the care  everywhere function in epic to review the outside records available from external health systems.  Review of Systems:  A 10 point review of systems is negative, except for the pertinent positives and negatives detailed in the HPI.  Past Medical History: Past Medical History:  Diagnosis Date   Allergy    Anxiety    Back pain    Cyst of left breast    GERD (gastroesophageal reflux disease)    History of endometriosis    History of ovarian cyst    PTSD (post-traumatic stress disorder)     Past Surgical History: Past Surgical History:  Procedure Laterality Date   COLONOSCOPY WITH PROPOFOL N/A 03/21/2021   Procedure: COLONOSCOPY WITH PROPOFOL;  Surgeon: Pasty Spillers, MD;  Location: [facility] ENDOSCOPY;  Service: Endoscopy;  Laterality: N/A;   COLONOSCOPY WITH PROPOFOL N/A 05/08/2022   Procedure: COLONOSCOPY WITH PROPOFOL;  Surgeon: Toney Reil, MD;  Location: [facility] ENDOSCOPY;  Service: Gastroenterology;  Laterality: N/A;   LAPAROSCOPIC VAGINAL HYSTERECTOMY     SPINAL FUSION  2018   SPINE SURGERY      Allergies: Allergies as of 09/12/2022 - Review Complete 09/12/2022  Allergen Reaction Noted   Meperidine Swelling and Nausea Only 06/13/2014   Hydrocodone Nausea And Vomiting, Swelling, and Nausea Only 06/13/2014   Hydromorphone Nausea And Vomiting and Nausea Only 11/04/2014   Opium Other (See Comments) 06/13/2014   Oxycodone Nausea And Vomiting and Nausea Only 06/13/2014    Medications:  Current Outpatient Medications:    celecoxib (CELEBREX) 100 MG capsule, Take  1 capsule (100 mg total) by mouth 2 (two) times daily., Disp: 60 capsule, Rfl: 0   estradiol (VIVELLE-DOT) 0.075 MG/24HR, Place 1 patch onto the skin 2 (two) times a week., Disp: 24 patch, Rfl: 2   Eszopiclone 3 MG TABS, TAKE 1 TABLET BY MOUTH AT BEDTIME TAKE IMMEDIATELY BEFORE BEDTIME, Disp: 30 tablet, Rfl: 0   loratadine (CLARITIN) 10 MG tablet, Take 10 mg by mouth daily as needed., Disp: , Rfl:     methocarbamol (ROBAXIN) 500 MG tablet, Take 1 tablet (500 mg total) by mouth every 8 (eight) hours as needed., Disp: 15 tablet, Rfl: 0   pantoprazole (PROTONIX) 40 MG tablet, Take 1 tablet (40 mg total) by mouth daily., Disp: 90 tablet, Rfl: 2   sertraline (ZOLOFT) 50 MG tablet, TK 1 T PO QD UTD, Disp: , Rfl:    SUMAtriptan (IMITREX) 100 MG tablet, TAKE 1 TABLET FOR MIGRAINE AS NEEDED. MAY REPEAT IN 2 HOURS IF HEADACHE PERSISTS. MAX 2 TABS PER DAY, Disp: 10 tablet, Rfl: 0   traZODone (DESYREL) 50 MG tablet, TAKE 0.5-1 TABLETS BY MOUTH AT BEDTIME AS NEEDED FOR SLEEP., Disp: 90 tablet, Rfl: 2  Current Facility-Administered Medications:    betamethasone acetate-betamethasone sodium phosphate (CELESTONE) injection 3 mg, 3 mg, Intra-articular, Once, Logan Bores, Larena Glassman, DPM  Social History: Social History   Tobacco Use   Smoking status: Never   Smokeless tobacco: Never  Vaping Use   Vaping Use: Never used  Substance Use Topics   Alcohol use: Yes    Alcohol/week: 1.0 standard drink of alcohol    Types: 1 Glasses of wine per week    Comment: glass of wine once a week   Drug use: Never    Family Medical History: Family History  Problem Relation Age of Onset   Autoimmune disease Mother    Heart attack Father 94   Cancer - Colon Maternal Grandmother    Alzheimer's disease Maternal Grandmother    Heart attack Maternal Grandfather    Bone cancer Paternal Grandmother     Physical Examination: Vitals:   09/12/22 0900  BP: 115/71  Pulse: 98  SpO2: 98%    General: Patient is well developed, well nourished, calm, collected, and in no apparent distress. Attention to examination is appropriate.  Neck:   Supple.  Full range of motion.  Respiratory: Patient is breathing without any difficulty.   NEUROLOGICAL:     Awake, alert, oriented to person, place, and time.  Speech is clear and fluent.   Cranial Nerves: Pupils equal round and reactive to light.  Facial tone is symmetric.  Facial  sensation is symmetric. Shoulder shrug is symmetric. Tongue protrusion is midline.  There is no pronator drift.  She appears quite stiff and is very careful with her movements.  She has some mild to moderate tenderness to palpation around her right SI joint.  Strength: Side Biceps Triceps Deltoid Interossei Grip Wrist Ext. Wrist Flex.  R 5 5 5 5 5 5 5   L 5 5 5 5 5 5 5    Side Iliopsoas Quads Hamstring PF DF EHL  R 5 5 5 5 5 5   L 5 5 5 5 5 5    Reflexes are 1+ and symmetric at the biceps, triceps, brachioradialis, patella and achilles.   Hoffman's is absent.   Bilateral upper and lower extremity sensation is intact to light touch.    No evidence of dysmetria noted.  Gait is normal.       Medical Decision Making  Imaging: MRI L spine and pelvis 09/10/2022 -- Posterior spinal fusion at L4-L5 with recurrent left foraminal disc extrusion versus scar resulting in severe left neural foraminal narrowing. Comparison with prior exams would be helpful.   -- Advanced degenerative changes at L3-L4 resulting in moderate to severe canal narrowing and moderate bilateral neural foraminal narrowing.   1. Please note examination was performed as a routine MS[facility] pelvis protocol and not optimized to evaluate the lower lumbosacral canal or pelvic organs.  2. 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  3. Mild urinary bladder distention with somewhat distended appearance of the urethra (7:34-35). Urogynecologic consultation should be considered if concern for abnormality in this region contributing to patient's reported incontinence.  I have personally reviewed the images and agree with the above interpretation.  Assessment and Plan: Ms. Ebanks is a pleasant 62 y.o. female with low back pain with some sciatic pain down the right leg.  She also has some signs of right sacroiliac joint pain.   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  It is possible that some of her symptoms are from sacroiliac joint dysfunction and some are from her adjacent segment disease.  I have recommended that she return to physical therapy.  I will put her on low-dose Celebrex, as that may be tolerable for her given her previous gastrointestinal issues with NSAIDs.  I will refer her to Dr. Clydene Fake to consider right-sided sacroiliac joint injection and possible right-sided L3-4 epidural steroid injection in the second visit to help elucidate the source of her pain and help with her symptoms.  I will see her after her injections.  I spent a total of 30 minutes in this patient's care today. This time was spent reviewing pertinent records including imaging studies, obtaining and confirming history, performing a directed evaluation, formulating and discussing my recommendations, and documenting the visit within the medical record.      Thank you for involving me in the care of this patient.      Shatonya Passon K. Myer Haff MD, [facility] Neurosurgery

## 2022-09-11 NOTE — Telephone Encounter (Signed)
-----   Message from Rockey Situ sent at 09/11/2022  1:42 PM EDT ----- Regarding: RE: switch to Dr Jeannie Fend? She confirmed tomorrows appt. She said that she sees Trey Paula for hip pain. She mentioned to him her symptoms and he recommended Dr.Yarbrough. he never treated her for these new symptoms. ----- Message ----- From: Sharlot Gowda, RN Sent: 09/11/2022   1:28 PM EDT To: Rockey Situ Subject: FW: switch to Dr Jeannie Fend?                            Please see if she wants to see Dr Jeannie Fend tomorrow ----- Message ----- From: Drake Leach, PA-C Sent: 09/11/2022   1:10 PM EDT To: Sharlot Gowda, RN Subject: RE: switch to Dr Jeannie Fend?                            If he has an opening, then that makes sense to me.   ----- Message ----- From: Sharlot Gowda, RN Sent: 09/11/2022  12:48 PM EDT To: Drake Leach, PA-C Subject: switch to Dr Jeannie Fend?                                On your schedule next week. Went to [facility] ED yesterday and had MRI. Should she be switched to Dr Lenoria Farrier schedule? He has an opening tomorrow. ED note isn't done except it says:  "Pt reports right back pain that goes down the right leg. Pt reports all positions worsen pain. Pt states after sleeping she feels better that worsens throughout the day. Pt reports incontinence with pain (bowel and bladder). Pt reports a fusion in l4-l5"   "Patient had a fall and has been having urinary incontinence and bowel incontinence since."

## 2022-09-12 ENCOUNTER — Ambulatory Visit: Payer: 59 | Admitting: Neurosurgery

## 2022-09-12 ENCOUNTER — Encounter: Payer: Self-pay | Admitting: Neurosurgery

## 2022-09-12 VITALS — BP 115/71 | HR 98 | Ht 68.0 in | Wt 161.4 lb

## 2022-09-12 DIAGNOSIS — M533 Sacrococcygeal disorders, not elsewhere classified: Secondary | ICD-10-CM | POA: Diagnosis not present

## 2022-09-12 DIAGNOSIS — M5441 Lumbago with sciatica, right side: Secondary | ICD-10-CM

## 2022-09-12 DIAGNOSIS — G8929 Other chronic pain: Secondary | ICD-10-CM

## 2022-09-12 MED ORDER — CELECOXIB 100 MG PO CAPS: 100.0000 mg | ORAL_CAPSULE | Freq: Two times a day (BID) | ORAL | 0 refills | Status: DC

## 2022-09-12 NOTE — Patient Instructions (Signed)
Dr Myer Haff would like to see you about 2 weeks after your injection. Please contact us once you have an appointment for an injection to make an appointment with Dr Myer Haff.  You are cleared to restart physical therapy and can talk to your physical therapist about SI joint related exercises.

## 2022-09-17 DIAGNOSIS — M25651 Stiffness of right hip, not elsewhere classified: Secondary | ICD-10-CM | POA: Diagnosis not present

## 2022-09-17 DIAGNOSIS — M25551 Pain in right hip: Secondary | ICD-10-CM | POA: Diagnosis not present

## 2022-09-18 DIAGNOSIS — H93A3 Pulsatile tinnitus, bilateral: Secondary | ICD-10-CM | POA: Diagnosis not present

## 2022-09-18 DIAGNOSIS — H9313 Tinnitus, bilateral: Secondary | ICD-10-CM | POA: Diagnosis not present

## 2022-09-18 DIAGNOSIS — H7202 Central perforation of tympanic membrane, left ear: Secondary | ICD-10-CM | POA: Diagnosis not present

## 2022-09-18 DIAGNOSIS — H9072 Mixed conductive and sensorineural hearing loss, unilateral, left ear, with unrestricted hearing on the contralateral side: Secondary | ICD-10-CM | POA: Diagnosis not present

## 2022-09-19 ENCOUNTER — Ambulatory Visit: Payer: 59 | Admitting: Orthopedic Surgery

## 2022-09-19 DIAGNOSIS — M461 Sacroiliitis, not elsewhere classified: Secondary | ICD-10-CM | POA: Diagnosis not present

## 2022-09-24 ENCOUNTER — Other Ambulatory Visit: Payer: Self-pay | Admitting: Otolaryngology

## 2022-09-24 DIAGNOSIS — H93A3 Pulsatile tinnitus, bilateral: Secondary | ICD-10-CM

## 2022-09-24 DIAGNOSIS — H9042 Sensorineural hearing loss, unilateral, left ear, with unrestricted hearing on the contralateral side: Secondary | ICD-10-CM

## 2022-09-24 DIAGNOSIS — M25551 Pain in right hip: Secondary | ICD-10-CM | POA: Diagnosis not present

## 2022-09-24 DIAGNOSIS — M25651 Stiffness of right hip, not elsewhere classified: Secondary | ICD-10-CM | POA: Diagnosis not present

## 2022-09-24 DIAGNOSIS — M542 Cervicalgia: Secondary | ICD-10-CM | POA: Diagnosis not present

## 2022-09-26 ENCOUNTER — Other Ambulatory Visit: Payer: Self-pay | Admitting: Family Medicine

## 2022-09-26 DIAGNOSIS — Z8669 Personal history of other diseases of the nervous system and sense organs: Secondary | ICD-10-CM

## 2022-10-01 DIAGNOSIS — M542 Cervicalgia: Secondary | ICD-10-CM | POA: Diagnosis not present

## 2022-10-01 DIAGNOSIS — M25551 Pain in right hip: Secondary | ICD-10-CM | POA: Diagnosis not present

## 2022-10-01 DIAGNOSIS — M25651 Stiffness of right hip, not elsewhere classified: Secondary | ICD-10-CM | POA: Diagnosis not present

## 2022-10-03 ENCOUNTER — Other Ambulatory Visit: Payer: Self-pay | Admitting: Obstetrics and Gynecology

## 2022-10-03 ENCOUNTER — Encounter: Payer: Self-pay | Admitting: Obstetrics and Gynecology

## 2022-10-03 DIAGNOSIS — Z79890 Hormone replacement therapy (postmenopausal): Secondary | ICD-10-CM

## 2022-10-03 DIAGNOSIS — N951 Menopausal and female climacteric states: Secondary | ICD-10-CM

## 2022-10-07 ENCOUNTER — Encounter: Payer: Self-pay | Admitting: Otolaryngology

## 2022-10-08 DIAGNOSIS — M6702 Short Achilles tendon (acquired), left ankle: Secondary | ICD-10-CM | POA: Diagnosis not present

## 2022-10-08 DIAGNOSIS — M79672 Pain in left foot: Secondary | ICD-10-CM | POA: Diagnosis not present

## 2022-10-08 DIAGNOSIS — G5762 Lesion of plantar nerve, left lower limb: Secondary | ICD-10-CM | POA: Diagnosis not present

## 2022-10-09 ENCOUNTER — Other Ambulatory Visit: Payer: Self-pay | Admitting: Neurosurgery

## 2022-10-09 ENCOUNTER — Encounter: Payer: Self-pay | Admitting: Podiatry

## 2022-10-09 NOTE — Telephone Encounter (Signed)
Celebrex refill okay. Please let her know it has been sent to the pharmacy.

## 2022-10-15 ENCOUNTER — Other Ambulatory Visit: Payer: Self-pay | Admitting: Gastroenterology

## 2022-10-15 ENCOUNTER — Other Ambulatory Visit: Payer: Self-pay | Admitting: Family Medicine

## 2022-10-15 DIAGNOSIS — M542 Cervicalgia: Secondary | ICD-10-CM | POA: Diagnosis not present

## 2022-10-15 DIAGNOSIS — M62838 Other muscle spasm: Secondary | ICD-10-CM

## 2022-10-15 DIAGNOSIS — M25551 Pain in right hip: Secondary | ICD-10-CM | POA: Diagnosis not present

## 2022-10-15 DIAGNOSIS — M25651 Stiffness of right hip, not elsewhere classified: Secondary | ICD-10-CM | POA: Diagnosis not present

## 2022-10-15 NOTE — Telephone Encounter (Signed)
This medication is typically for short-term use.  Can you find out what she has been using this for her most recently and how often she has been taking it?  Thanks.

## 2022-10-16 ENCOUNTER — Other Ambulatory Visit: Payer: 59

## 2022-10-16 NOTE — Telephone Encounter (Signed)
Left message to call the office back to see what she has been using the Methocarbamol for and how often has she been taking it?

## 2022-10-21 NOTE — Telephone Encounter (Signed)
Please reach out to the patient again regarding my question noted earlier in this refill request encounter. Thanks.

## 2022-10-22 DIAGNOSIS — M542 Cervicalgia: Secondary | ICD-10-CM | POA: Diagnosis not present

## 2022-10-23 ENCOUNTER — Other Ambulatory Visit: Payer: Self-pay | Admitting: Family Medicine

## 2022-10-23 DIAGNOSIS — Z8669 Personal history of other diseases of the nervous system and sense organs: Secondary | ICD-10-CM

## 2022-10-29 DIAGNOSIS — F5105 Insomnia due to other mental disorder: Secondary | ICD-10-CM | POA: Diagnosis not present

## 2022-10-29 DIAGNOSIS — F419 Anxiety disorder, unspecified: Secondary | ICD-10-CM | POA: Diagnosis not present

## 2022-10-29 DIAGNOSIS — F99 Mental disorder, not otherwise specified: Secondary | ICD-10-CM | POA: Diagnosis not present

## 2022-10-30 ENCOUNTER — Other Ambulatory Visit: Payer: Self-pay | Admitting: Otolaryngology

## 2022-10-30 DIAGNOSIS — H93A3 Pulsatile tinnitus, bilateral: Secondary | ICD-10-CM

## 2022-10-30 DIAGNOSIS — H9042 Sensorineural hearing loss, unilateral, left ear, with unrestricted hearing on the contralateral side: Secondary | ICD-10-CM

## 2022-10-31 DIAGNOSIS — M542 Cervicalgia: Secondary | ICD-10-CM | POA: Diagnosis not present

## 2022-11-07 DIAGNOSIS — M25651 Stiffness of right hip, not elsewhere classified: Secondary | ICD-10-CM | POA: Diagnosis not present

## 2022-11-07 DIAGNOSIS — M25551 Pain in right hip: Secondary | ICD-10-CM | POA: Diagnosis not present

## 2022-11-14 DIAGNOSIS — M542 Cervicalgia: Secondary | ICD-10-CM | POA: Diagnosis not present

## 2022-11-14 DIAGNOSIS — M25651 Stiffness of right hip, not elsewhere classified: Secondary | ICD-10-CM | POA: Diagnosis not present

## 2022-11-14 DIAGNOSIS — M25551 Pain in right hip: Secondary | ICD-10-CM | POA: Diagnosis not present

## 2022-11-20 ENCOUNTER — Ambulatory Visit
Admission: RE | Admit: 2022-11-20 | Discharge: 2022-11-20 | Disposition: A | Discharge status: Home / Self Care | Payer: 59 | Source: Ambulatory Visit | Attending: Otolaryngology | Admitting: Otolaryngology

## 2022-11-20 ENCOUNTER — Other Ambulatory Visit: Payer: 59

## 2022-11-20 DIAGNOSIS — H9042 Sensorineural hearing loss, unilateral, left ear, with unrestricted hearing on the contralateral side: Secondary | ICD-10-CM

## 2022-11-20 DIAGNOSIS — H93A3 Pulsatile tinnitus, bilateral: Secondary | ICD-10-CM | POA: Diagnosis not present

## 2022-11-20 MED ORDER — GADOPICLENOL 0.5 MMOL/ML IV SOLN
7.5000 mL | Freq: Once | INTRAVENOUS | Status: AC | PRN
  Administered 2022-11-20: 7.5 mL via INTRAVENOUS

## 2022-11-21 DIAGNOSIS — M25651 Stiffness of right hip, not elsewhere classified: Secondary | ICD-10-CM | POA: Diagnosis not present

## 2022-11-21 DIAGNOSIS — M542 Cervicalgia: Secondary | ICD-10-CM | POA: Diagnosis not present

## 2022-11-21 DIAGNOSIS — M25551 Pain in right hip: Secondary | ICD-10-CM | POA: Diagnosis not present

## 2022-11-25 ENCOUNTER — Other Ambulatory Visit: Payer: Self-pay | Admitting: Family Medicine

## 2022-11-25 DIAGNOSIS — Z8669 Personal history of other diseases of the nervous system and sense organs: Secondary | ICD-10-CM

## 2022-11-25 DIAGNOSIS — L739 Follicular disorder, unspecified: Secondary | ICD-10-CM | POA: Diagnosis not present

## 2022-12-04 ENCOUNTER — Other Ambulatory Visit: Payer: Self-pay | Admitting: Orthopedic Surgery

## 2022-12-05 DIAGNOSIS — M542 Cervicalgia: Secondary | ICD-10-CM | POA: Diagnosis not present

## 2022-12-12 DIAGNOSIS — M25651 Stiffness of right hip, not elsewhere classified: Secondary | ICD-10-CM | POA: Diagnosis not present

## 2022-12-12 DIAGNOSIS — M25551 Pain in right hip: Secondary | ICD-10-CM | POA: Diagnosis not present

## 2022-12-17 DIAGNOSIS — M25651 Stiffness of right hip, not elsewhere classified: Secondary | ICD-10-CM | POA: Diagnosis not present

## 2022-12-17 DIAGNOSIS — M25551 Pain in right hip: Secondary | ICD-10-CM | POA: Diagnosis not present

## 2022-12-17 DIAGNOSIS — M542 Cervicalgia: Secondary | ICD-10-CM | POA: Diagnosis not present

## 2022-12-24 DIAGNOSIS — G5762 Lesion of plantar nerve, left lower limb: Secondary | ICD-10-CM | POA: Diagnosis not present

## 2022-12-25 ENCOUNTER — Other Ambulatory Visit: Payer: Self-pay

## 2022-12-25 DIAGNOSIS — N951 Menopausal and female climacteric states: Secondary | ICD-10-CM

## 2022-12-25 DIAGNOSIS — Z79890 Hormone replacement therapy (postmenopausal): Secondary | ICD-10-CM

## 2022-12-25 MED ORDER — ESTRADIOL 0.075 MG/24HR TD PTTW
1.0000 | MEDICATED_PATCH | TRANSDERMAL | 2 refills | Status: DC
Start: 2022-12-26 — End: 2023-03-17

## 2022-12-25 NOTE — Telephone Encounter (Signed)
Pt calling for refill of estrogen patch .75mg ; has appt in September.  [phone] Pt aware refill eRx'd.

## 2022-12-26 DIAGNOSIS — M25651 Stiffness of right hip, not elsewhere classified: Secondary | ICD-10-CM | POA: Diagnosis not present

## 2022-12-26 DIAGNOSIS — M25551 Pain in right hip: Secondary | ICD-10-CM | POA: Diagnosis not present

## 2022-12-26 DIAGNOSIS — M542 Cervicalgia: Secondary | ICD-10-CM | POA: Diagnosis not present

## 2022-12-31 DIAGNOSIS — G5762 Lesion of plantar nerve, left lower limb: Secondary | ICD-10-CM | POA: Insufficient documentation

## 2023-01-01 ENCOUNTER — Other Ambulatory Visit: Payer: Self-pay | Admitting: Family Medicine

## 2023-01-01 DIAGNOSIS — Z8669 Personal history of other diseases of the nervous system and sense organs: Secondary | ICD-10-CM

## 2023-01-02 DIAGNOSIS — M25651 Stiffness of right hip, not elsewhere classified: Secondary | ICD-10-CM | POA: Diagnosis not present

## 2023-01-02 DIAGNOSIS — M25551 Pain in right hip: Secondary | ICD-10-CM | POA: Diagnosis not present

## 2023-01-02 DIAGNOSIS — M542 Cervicalgia: Secondary | ICD-10-CM | POA: Diagnosis not present

## 2023-01-14 ENCOUNTER — Other Ambulatory Visit: Payer: Self-pay | Admitting: Family Medicine

## 2023-01-14 DIAGNOSIS — F5101 Primary insomnia: Secondary | ICD-10-CM

## 2023-01-15 MED ORDER — ESZOPICLONE 3 MG PO TABS
ORAL_TABLET | ORAL | 0 refills | Status: AC
Start: 2023-01-15 — End: ?

## 2023-01-15 NOTE — Telephone Encounter (Signed)
Eszopiclone 3 MG TABS LOV 06/28/2022 NOV 01/17/2023

## 2023-01-16 DIAGNOSIS — M25651 Stiffness of right hip, not elsewhere classified: Secondary | ICD-10-CM | POA: Diagnosis not present

## 2023-01-16 DIAGNOSIS — M542 Cervicalgia: Secondary | ICD-10-CM | POA: Diagnosis not present

## 2023-01-16 DIAGNOSIS — M25551 Pain in right hip: Secondary | ICD-10-CM | POA: Diagnosis not present

## 2023-01-17 ENCOUNTER — Encounter: Payer: Self-pay | Admitting: Family Medicine

## 2023-01-17 ENCOUNTER — Ambulatory Visit: Payer: 59 | Admitting: Family Medicine

## 2023-01-17 VITALS — BP 108/68 | HR 75 | Temp 97.9°F | Ht 68.0 in | Wt 154.4 lb

## 2023-01-17 DIAGNOSIS — H44009 Unspecified purulent endophthalmitis, unspecified eye: Secondary | ICD-10-CM | POA: Diagnosis not present

## 2023-01-17 DIAGNOSIS — K219 Gastro-esophageal reflux disease without esophagitis: Secondary | ICD-10-CM | POA: Diagnosis not present

## 2023-01-17 MED ORDER — PANTOPRAZOLE SODIUM 40 MG PO TBEC
40.0000 mg | DELAYED_RELEASE_TABLET | Freq: Two times a day (BID) | ORAL | 2 refills | Status: DC
Start: 2023-01-17 — End: 2023-01-30

## 2023-01-17 NOTE — Assessment & Plan Note (Signed)
Chronic issue.  Worsened recently.  We will have her take Protonix twice daily.  Discussed taking it 30 to 60 minutes before breakfast.  We will refer her to GI for evaluation as it seems as though she may need another endoscopy.  If the Protonix dose increase is not beneficial over the next week or 2 she will let us know and we can try Dexilant.

## 2023-01-17 NOTE — Assessment & Plan Note (Signed)
Patient has an ongoing issue with this.  We will request notes from [facility].  Discussed they are the only ophthalmologist in town so she should continue to see them unless she wants to drive to [place].  Discussed I would let her know if there is something else she could be doing once I review the notes.

## 2023-01-17 NOTE — Progress Notes (Signed)
Marikay Alar, MD Phone: [phone]  Lisa Gonzalez is a 62 y.o. female who presents today for f/u.  GERD:   Reflux symptoms: coughing at night   Abd pain: epigastric discomfort   Blood in stool: no  Dysphagia: yes   EGD: patient thinks last done in 2017 in [place]  Medication: taking Protonix once daily and Pepcid.  She notes at 1 point she was taking Protonix twice daily. Patient notes all of this started after being placed on Celebrex and then taking an antibiotic.  She has limited her eating to nothing after 3 PM.  She is also been taking Gaviscon.  Eye infection: Patient reports she has been treated for this for several months by [facility].  She had an issue where the eye was swollen up and she was unable to see.  Her vision has improved.  She has been on an antibiotic/steroid eyedrop and is just finishing this up.  Notes the eye still feels a little irritated.  She wonders if she should see somebody other than [facility].   Social History   Tobacco Use  Smoking Status Never  Smokeless Tobacco Never    Current Outpatient Medications on File Prior to Visit  Medication Sig Dispense Refill   celecoxib (CELEBREX) 100 MG capsule TAKE 1 CAPSULE BY MOUTH TWICE A DAY 60 capsule 1   Eszopiclone 3 MG TABS TAKE 1 TABLET BY MOUTH AT BEDTIME TAKE IMMEDIATELY BEFORE BEDTIME 30 tablet 0   loratadine (CLARITIN) 10 MG tablet Take 10 mg by mouth daily as needed.     methocarbamol (ROBAXIN) 500 MG tablet Take 1 tablet (500 mg total) by mouth every 8 (eight) hours as needed. 15 tablet 0   SUMAtriptan (IMITREX) 100 MG tablet TAKE 1 TABLET FOR MIGRAINE AS NEEDED. MAY REPEAT IN 2 HOURS IF HEADACHE PERSISTS. MAX 2 TABS PER DAY 10 tablet 0   estradiol (VIVELLE-DOT) 0.075 MG/24HR Place 1 patch onto the skin 2 (two) times a week. (Patient not taking: Reported on 01/17/2023) 24 patch 2   Current Facility-Administered Medications on File Prior to Visit  Medication Dose Route Frequency Provider  Last Rate Last Admin   betamethasone acetate-betamethasone sodium phosphate (CELESTONE) injection 3 mg  3 mg Intra-articular Once Felecia Shelling, DPM         ROS see history of present illness  Objective  Physical Exam Vitals:   01/17/23 0836  BP: 108/68  Pulse: 75  Temp: 97.9 F (36.6 C)  SpO2: 98%    BP Readings from Last 3 Encounters:  01/17/23 108/68  09/12/22 115/71  08/09/22 90/60   Wt Readings from Last 3 Encounters:  01/17/23 154 lb 6.4 oz (70 kg)  09/12/22 161 lb 6.4 oz (73.2 kg)  08/09/22 155 lb 9.6 oz (70.6 kg)    Physical Exam Constitutional:      General: She is not in acute distress.    Appearance: She is not diaphoretic.  Eyes:     Conjunctiva/sclera: Conjunctivae normal.  Cardiovascular:     Rate and Rhythm: Normal rate and regular rhythm.     Heart sounds: Normal heart sounds.  Pulmonary:     Effort: Pulmonary effort is normal.     Breath sounds: Normal breath sounds.  Skin:    General: Skin is warm and dry.  Neurological:     Mental Status: She is alert.      Assessment/Plan: Please see individual problem list.  Gastroesophageal reflux disease without esophagitis Assessment & Plan: Chronic issue.  Worsened recently.  We will have her take Protonix twice daily.  Discussed taking it 30 to 60 minutes before breakfast.  We will refer her to GI for evaluation as it seems as though she may need another endoscopy.  If the Protonix dose increase is not beneficial over the next week or 2 she will let us know and we can try Dexilant.  Orders: -     Pantoprazole Sodium; Take 1 tablet (40 mg total) by mouth 2 (two) times daily before a meal.  Dispense: 180 tablet; Refill: 2 -     Ambulatory referral to Gastroenterology  Eye infection, unspecified laterality Assessment & Plan: Patient has an ongoing issue with this.  We will request notes from [facility].  Discussed they are the only ophthalmologist in town so she should continue to see them  unless she wants to drive to [place].  Discussed I would let her know if there is something else she could be doing once I review the notes.      Return if symptoms worsen or fail to improve.   Marikay Alar, MD [facility]

## 2023-01-23 ENCOUNTER — Ambulatory Visit: Payer: 59 | Admitting: Family Medicine

## 2023-01-24 DIAGNOSIS — F99 Mental disorder, not otherwise specified: Secondary | ICD-10-CM | POA: Diagnosis not present

## 2023-01-24 DIAGNOSIS — F5105 Insomnia due to other mental disorder: Secondary | ICD-10-CM | POA: Diagnosis not present

## 2023-01-24 DIAGNOSIS — F419 Anxiety disorder, unspecified: Secondary | ICD-10-CM | POA: Diagnosis not present

## 2023-01-30 ENCOUNTER — Other Ambulatory Visit: Payer: Self-pay | Admitting: Family Medicine

## 2023-01-30 DIAGNOSIS — K219 Gastro-esophageal reflux disease without esophagitis: Secondary | ICD-10-CM

## 2023-01-30 DIAGNOSIS — M542 Cervicalgia: Secondary | ICD-10-CM | POA: Diagnosis not present

## 2023-02-01 ENCOUNTER — Encounter: Payer: Self-pay | Admitting: Podiatry

## 2023-02-13 ENCOUNTER — Ambulatory Visit: Payer: 59 | Admitting: Obstetrics and Gynecology

## 2023-02-13 DIAGNOSIS — M542 Cervicalgia: Secondary | ICD-10-CM | POA: Diagnosis not present

## 2023-02-14 ENCOUNTER — Other Ambulatory Visit: Payer: Self-pay | Admitting: Obstetrics and Gynecology

## 2023-02-14 DIAGNOSIS — Z1231 Encounter for screening mammogram for malignant neoplasm of breast: Secondary | ICD-10-CM

## 2023-02-26 ENCOUNTER — Ambulatory Visit
Admission: RE | Admit: 2023-02-26 | Discharge: 2023-02-26 | Disposition: A | Discharge status: Home / Self Care | Payer: 59 | Source: Ambulatory Visit | Attending: Obstetrics and Gynecology | Admitting: Obstetrics and Gynecology

## 2023-02-26 DIAGNOSIS — Z1231 Encounter for screening mammogram for malignant neoplasm of breast: Secondary | ICD-10-CM

## 2023-03-16 NOTE — Progress Notes (Unsigned)
PCP: Glori Luis, MD   No chief complaint on file.   HPI:      [name] is a 62 y.o. [id number] whose LMP was No LMP recorded. Patient has had a hysterectomy., presents today for her annual examination.  Her menses are absent due to menopause/hyst for endometriosis. No PMB.   Pt on vivelle dot 0.075 mg. Vasomotor sx improved and pt would like to try lower dose. Also now having breast tenderness which is unusual for pt. Hx of insomnia, takes xanax 0.5 mg or lunesta (Rxd by PCP) a few nights weekly but concerned about addictive potential with them, so doesn't use nightly. Goes some nights without any meds and doesn't sleep those nights. Had worsening sleep sx with prometrium in past.  Exercise has helped mental health.   Sex activity: single partner, contraception - status post hysterectomy. She does not have vaginal dryness.  Last Pap: N/A due to hyst; no hx of abn paps.  Hx of STDs: none  Last mammogram: 02/26/23 Results were: normal--routine follow-up in 12 months. Seen for RT breast mass 12/21; benign cyst on mammo/ u/s. Pt with hx of lumpy breasts. No change in RT breast cyst.  There is no FH of breast cancer. There is no FH of ovarian cancer. The patient does do self-breast exams.  Colonoscopy: 2022 with Dr. Allegra Lai with polyp;  Repeat due after 1 year. FH colon cancer in her MGM.  Tobacco use: The patient denies current or previous tobacco use. Alcohol use: none  No drug use Exercise: very active  She does get adequate calcium and Vitamin D in her diet.  Labs with PCP.   Past Medical History:  Diagnosis Date   Allergy    Anxiety    Back pain    Cyst of left breast    GERD (gastroesophageal reflux disease)    History of endometriosis    History of ovarian cyst    PTSD (post-traumatic stress disorder)     Past Surgical History:  Procedure Laterality Date   COLONOSCOPY WITH PROPOFOL N/A 03/21/2021   Procedure: COLONOSCOPY WITH PROPOFOL;  Surgeon:  Pasty Spillers, MD;  Location: [facility] ENDOSCOPY;  Service: Endoscopy;  Laterality: N/A;   COLONOSCOPY WITH PROPOFOL N/A 05/08/2022   Procedure: COLONOSCOPY WITH PROPOFOL;  Surgeon: Toney Reil, MD;  Location: [facility] ENDOSCOPY;  Service: Gastroenterology;  Laterality: N/A;   LAPAROSCOPIC VAGINAL HYSTERECTOMY     SPINAL FUSION  2018   SPINE SURGERY      Family History  Problem Relation Age of Onset   Autoimmune disease Mother    Heart attack Father 80   Cancer - Colon Maternal Grandmother    Alzheimer's disease Maternal Grandmother    Heart attack Maternal Grandfather    Bone cancer Paternal Grandmother     Social History   Socioeconomic History   Marital status: Married    Spouse name: Not on file   Number of children: Not on file   Years of education: Not on file   Highest education level: Master's degree (e.g., MA, MS, MEng, MEd, MSW, MBA)  Occupational History   Not on file  Tobacco Use   Smoking status: Never   Smokeless tobacco: Never  Vaping Use   Vaping status: Never Used  Substance and Sexual Activity   Alcohol use: Yes    Alcohol/week: 1.0 standard drink of alcohol    Types: 1 Glasses of wine per week    Comment: glass of wine once  a week   Drug use: Never   Sexual activity: Yes    Birth control/protection: Surgical    Comment: Hysterectomy  Other Topics Concern   Not on file  Social History Narrative   Not on file   Social Determinants of Health   Financial Resource Strain: Low Risk  (01/13/2023)   Overall Financial Resource Strain (CARDIA)    Difficulty of Paying Living Expenses: Not hard at all  Food Insecurity: No Food Insecurity (01/13/2023)   Hunger Vital Sign    Worried About Running Out of Food in the Last Year: Never true    Ran Out of Food in the Last Year: Never true  Transportation Needs: No Transportation Needs (01/13/2023)   PRAPARE - Administrator, Civil Service (Medical): No    Lack of Transportation (Non-Medical):  No  Physical Activity: Insufficiently Active (01/13/2023)   Exercise Vital Sign    Days of Exercise per Week: 3 days    Minutes of Exercise per Session: 30 min  Stress: No Stress Concern Present (01/13/2023)   [employer] of Occupational Health - Occupational Stress Questionnaire    Feeling of Stress : Not at all  Social Connections: Socially Integrated (01/13/2023)   Social Connection and Isolation Panel [NHANES]    Frequency of Communication with Friends and Family: More than three times a week    Frequency of Social Gatherings with Friends and Family: Once a week    Attends Religious Services: More than 4 times per year    Active Member of [employer] or Organizations: Yes    Attends Engineer, structural: More than 4 times per year    Marital Status: Married  Catering manager Violence: Not on file     Current Outpatient Medications:    celecoxib (CELEBREX) 100 MG capsule, TAKE 1 CAPSULE BY MOUTH TWICE A DAY, Disp: 60 capsule, Rfl: 1   estradiol (VIVELLE-DOT) 0.075 MG/24HR, Place 1 patch onto the skin 2 (two) times a week. (Patient not taking: Reported on 01/17/2023), Disp: 24 patch, Rfl: 2   Eszopiclone 3 MG TABS, TAKE 1 TABLET BY MOUTH AT BEDTIME TAKE IMMEDIATELY BEFORE BEDTIME, Disp: 30 tablet, Rfl: 0   loratadine (CLARITIN) 10 MG tablet, Take 10 mg by mouth daily as needed., Disp: , Rfl:    methocarbamol (ROBAXIN) 500 MG tablet, Take 1 tablet (500 mg total) by mouth every 8 (eight) hours as needed., Disp: 15 tablet, Rfl: 0   pantoprazole (PROTONIX) 40 MG tablet, TAKE 1 TABLET BY MOUTH EVERY DAY, Disp: 30 tablet, Rfl: 8   SUMAtriptan (IMITREX) 100 MG tablet, TAKE 1 TABLET FOR MIGRAINE AS NEEDED. MAY REPEAT IN 2 HOURS IF HEADACHE PERSISTS. MAX 2 TABS PER DAY, Disp: 10 tablet, Rfl: 0  Current Facility-Administered Medications:    betamethasone acetate-betamethasone sodium phosphate (CELESTONE) injection 3 mg, 3 mg, Intra-articular, Once, Evans, Brent M,  DPM     ROS:  Review of Systems  Constitutional:  Negative for fatigue, fever and unexpected weight change.  Respiratory:  Negative for cough, shortness of breath and wheezing.   Cardiovascular:  Negative for chest pain, palpitations and leg swelling.  Gastrointestinal:  Negative for blood in stool, constipation, diarrhea, nausea and vomiting.  Endocrine: Negative for cold intolerance, heat intolerance and polyuria.  Genitourinary:  Negative for dyspareunia, dysuria, flank pain, frequency, genital sores, hematuria, menstrual problem, pelvic pain, urgency, vaginal bleeding, vaginal discharge and vaginal pain.  Musculoskeletal:  Negative for back pain, joint swelling and myalgias.  Skin:  Negative  for rash.  Neurological:  Negative for dizziness, syncope, light-headedness, numbness and headaches.  Hematological:  Negative for adenopathy.  Psychiatric/Behavioral:  Positive for sleep disturbance. Negative for agitation, confusion and suicidal ideas. The patient is not nervous/anxious.   BREAST: No symptoms    Objective: There were no vitals taken for this visit.   Physical Exam Constitutional:      Appearance: She is well-developed.  Genitourinary:     Vulva normal.     Genitourinary Comments: UTERUS/CX SURG REM     Right Labia: No rash, tenderness, lesions or skin changes.    Left Labia: No tenderness, lesions, skin changes or rash.    Vaginal cuff intact.    No vaginal discharge, erythema or tenderness.      Right Adnexa: not tender and no mass present.    Left Adnexa: not tender and no mass present.    Cervix is absent.     No cervical friability or polyp.     Uterus is not enlarged or tender.     Uterus is absent.  Breasts:    Right: No mass, nipple discharge, skin change or tenderness.     Left: No mass, nipple discharge, skin change or tenderness.  Neck:     Thyroid: No thyromegaly.  Cardiovascular:     Rate and Rhythm: Normal rate and regular rhythm.     Heart  sounds: Normal heart sounds. No murmur heard. Pulmonary:     Effort: Pulmonary effort is normal.     Breath sounds: Normal breath sounds.  Chest:    Abdominal:     Palpations: Abdomen is soft.     Tenderness: There is no abdominal tenderness. There is no guarding or rebound.  Musculoskeletal:        General: Normal range of motion.     Cervical back: Normal range of motion.  Lymphadenopathy:     Cervical: No cervical adenopathy.  Neurological:     General: No focal deficit present.     Mental Status: She is alert and oriented to person, place, and time.     Cranial Nerves: No cranial nerve deficit.  Skin:    General: Skin is warm and dry.  Psychiatric:        Mood and Affect: Mood normal.        Behavior: Behavior normal.        Thought Content: Thought content normal.        Judgment: Judgment normal.  Vitals reviewed.     Assessment/Plan:  Encounter for annual routine gynecological examination  Encounter for screening mammogram for malignant neoplasm of breast - Plan: MM 3D SCREEN BREAST BILATERAL; pt to schedule mammo  Hormone replacement therapy (HRT) - Plan: estradiol (VIVELLE-DOT) 0.05 MG/24HR patch  Vasomotor symptoms due to menopause - Plan: estradiol (VIVELLE-DOT) 0.05 MG/24HR patch; pt would like to decrease dose. Change to 0.05 mg patch, Rx eRxd. F/u prn.   Other insomnia - Plan: cyproheptadine (PERIACTIN) 4 MG tablet; try periactin for sleep. F/u prn  No orders of the defined types were placed in this encounter.          GYN counsel breast self exam, mammography screening, use and side effects of HRT, menopause, adequate intake of calcium and vitamin D, diet and exercise    F/U  No follow-ups on file.  Florenda Watt B. Amron Guerrette, PA-C 03/16/2023 4:39 PM

## 2023-03-17 ENCOUNTER — Ambulatory Visit (INDEPENDENT_AMBULATORY_CARE_PROVIDER_SITE_OTHER): Payer: 59 | Admitting: Obstetrics and Gynecology

## 2023-03-17 ENCOUNTER — Encounter: Payer: Self-pay | Admitting: Obstetrics and Gynecology

## 2023-03-17 ENCOUNTER — Other Ambulatory Visit: Payer: Self-pay | Admitting: Obstetrics and Gynecology

## 2023-03-17 VITALS — BP 83/51 | HR 69 | Ht 68.0 in | Wt 159.0 lb

## 2023-03-17 DIAGNOSIS — N951 Menopausal and female climacteric states: Secondary | ICD-10-CM

## 2023-03-17 DIAGNOSIS — G4709 Other insomnia: Secondary | ICD-10-CM

## 2023-03-17 DIAGNOSIS — N941 Unspecified dyspareunia: Secondary | ICD-10-CM

## 2023-03-17 DIAGNOSIS — Z79890 Hormone replacement therapy (postmenopausal): Secondary | ICD-10-CM

## 2023-03-17 DIAGNOSIS — Z01419 Encounter for gynecological examination (general) (routine) without abnormal findings: Secondary | ICD-10-CM | POA: Diagnosis not present

## 2023-03-17 DIAGNOSIS — R2232 Localized swelling, mass and lump, left upper limb: Secondary | ICD-10-CM

## 2023-03-17 DIAGNOSIS — Z1231 Encounter for screening mammogram for malignant neoplasm of breast: Secondary | ICD-10-CM

## 2023-03-17 MED ORDER — ESTRADIOL 0.1 MG/GM VA CREA
TOPICAL_CREAM | VAGINAL | 1 refills | Status: AC
Start: 2023-03-17 — End: ?

## 2023-03-17 MED ORDER — IMVEXXY MAINTENANCE PACK 10 MCG VA INST
10.0000 ug | VAGINAL_INSERT | VAGINAL | 3 refills | Status: DC
Start: 2023-03-17 — End: 2023-03-17

## 2023-03-17 MED ORDER — ESTRADIOL 0.05 MG/24HR TD PTTW
1.0000 | MEDICATED_PATCH | TRANSDERMAL | 3 refills | Status: AC
Start: 2023-03-17 — End: ?

## 2023-03-17 NOTE — Addendum Note (Signed)
Addended by: Althea Grimmer B on: 03/17/2023 11:32 AM   Modules accepted: Orders

## 2023-03-17 NOTE — Patient Instructions (Signed)
I value your feedback and you entrusting us with your care. If you get a [facility] patient survey, I would appreciate you taking the time to let us know about your experience today. Thank you! ? ? ?

## 2023-03-20 DIAGNOSIS — F5105 Insomnia due to other mental disorder: Secondary | ICD-10-CM | POA: Diagnosis not present

## 2023-03-20 DIAGNOSIS — F419 Anxiety disorder, unspecified: Secondary | ICD-10-CM | POA: Diagnosis not present

## 2023-03-20 DIAGNOSIS — F99 Mental disorder, not otherwise specified: Secondary | ICD-10-CM | POA: Diagnosis not present

## 2023-03-26 ENCOUNTER — Encounter: Payer: Self-pay | Admitting: Gastroenterology

## 2023-03-26 ENCOUNTER — Other Ambulatory Visit: Payer: Self-pay

## 2023-03-26 ENCOUNTER — Ambulatory Visit: Payer: 59 | Admitting: Gastroenterology

## 2023-03-26 VITALS — BP 102/63 | HR 67 | Temp 97.9°F | Ht 68.0 in | Wt 159.4 lb

## 2023-03-26 DIAGNOSIS — M5412 Radiculopathy, cervical region: Secondary | ICD-10-CM | POA: Diagnosis not present

## 2023-03-26 DIAGNOSIS — K5900 Constipation, unspecified: Secondary | ICD-10-CM

## 2023-03-26 DIAGNOSIS — K219 Gastro-esophageal reflux disease without esophagitis: Secondary | ICD-10-CM

## 2023-03-26 DIAGNOSIS — R1013 Epigastric pain: Secondary | ICD-10-CM | POA: Diagnosis not present

## 2023-03-26 DIAGNOSIS — M461 Sacroiliitis, not elsewhere classified: Secondary | ICD-10-CM | POA: Diagnosis not present

## 2023-03-26 NOTE — Progress Notes (Signed)
Arlyss Repress, MD [address]  [address]  [place], [place] [address]  Main: [phone]  Fax: [phone]    Gastroenterology Consultation  Referring Provider:     Glori Luis, MD Primary Care Physician:  Glori Luis, MD Primary Gastroenterologist:  Dr. Arlyss Repress Reason for Consultation: Chronic GERD, dyspepsia        HPI:   Lisa Gonzalez is a 62 y.o. female referred by Dr. Birdie Sons, Yehuda Mao, MD  for consultation & management of symptoms of burning in the chest, epigastric discomfort, bloating as well as intermittent hard stools.  She reports having several years history approximately more than 25 years history of intermittent heartburn, she was taking over-the-counter/prescription acid suppressive medications as needed during flareups along with avoidance of dietary triggers.  Most recently, she was on antibiotics for eye infection which triggered her acid reflux, started on pantoprazole 40 mg p.o. twice daily which provided significant relief.  She also noticed that her bowel movements were getting harder associated with some abdominal bloating.  She has given up coffee recently as well.  She cannot have anything to eat after 3 PM.  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 Complete resection. Polyp tissue not retrieved. - The distal rectum and anal verge are normal on retroflexion view. - Diverticulosis in the recto- sigmoid colon, in the sigmoid colon and in the transverse colon. - The examination was  otherwise normal.  Colonoscopy 03/21/2021 DIAGNOSIS:  A. COLON POLYP, CECUM; COLD BIOPSY:  - SESSILE SERRATED POLYP.  - NEGATIVE FOR DYSPLASIA AND MALIGNANCY.   B. COLON POLYP, HEPATIC FLEXURE; COLD BIOPSY:  - SESSILE SERRATED POLYP.  - NEGATIVE FOR DYSPLASIA AND MALIGNANCY.   C. COLON POLYP, TRANSVERSE; COLD SNARE:  - SESSILE SERRATED POLYP.  - NEGATIVE FOR DYSPLASIA AND MALIGNANCY.   D. RECTAL POLYP; COLD BIOPSY:  - HYPERPLASTIC POLYP.  - NEGATIVE FOR DYSPLASIA AND MALIGNANCY.   Past Medical History:  Diagnosis Date   Allergy    Anxiety    Back pain    Cyst of left breast    GERD (gastroesophageal reflux disease)    History of endometriosis    History of ovarian cyst    PTSD (post-traumatic stress disorder)    Tinnitus     Past Surgical History:  Procedure Laterality Date   COLONOSCOPY WITH PROPOFOL N/A 03/21/2021   Procedure: COLONOSCOPY WITH PROPOFOL;  Surgeon: Pasty Spillers, MD;  Location: [facility] ENDOSCOPY;  Service: Endoscopy;  Laterality: N/A;   COLONOSCOPY WITH PROPOFOL N/A 05/08/2022   Procedure: COLONOSCOPY WITH PROPOFOL;  Surgeon: Toney Reil, MD;  Location: [facility] ENDOSCOPY;  Service: Gastroenterology;  Laterality: N/A;   LAPAROSCOPIC VAGINAL HYSTERECTOMY     SPINAL FUSION  2018   SPINE SURGERY       Current Outpatient Medications:    celecoxib (CELEBREX) 100 MG capsule, TAKE 1 CAPSULE BY MOUTH TWICE A DAY, Disp: 60 capsule, Rfl: 1   escitalopram (LEXAPRO) 10 MG tablet, Take 10 mg by mouth daily., Disp: , Rfl:  estradiol (ESTRACE VAGINAL) 0.1 MG/GM vaginal cream, Insert 1 g vaginally nightly for 1 week, then once wkly as maintenance, Disp: 42.5 g, Rfl: 1   estradiol (VIVELLE-DOT) 0.05 MG/24HR patch, Place 1 patch (0.05 mg total) onto the skin 2 (two) times a week., Disp: 24 patch, Rfl: 3   eszopiclone (LUNESTA) 2 MG TABS tablet, Take 2 mg by mouth at bedtime as needed., Disp: , Rfl:    Eszopiclone 3 MG TABS, TAKE 1 TABLET BY MOUTH AT BEDTIME  TAKE IMMEDIATELY BEFORE BEDTIME, Disp: 30 tablet, Rfl: 0   famotidine (PEPCID) 20 MG tablet, Take 20 mg by mouth at bedtime., Disp: , Rfl:    loratadine (CLARITIN) 10 MG tablet, Take 10 mg by mouth daily as needed., Disp: , Rfl:    methocarbamol (ROBAXIN) 500 MG tablet, Take 1 tablet (500 mg total) by mouth every 8 (eight) hours as needed., Disp: 15 tablet, Rfl: 0   pantoprazole (PROTONIX) 40 MG tablet, TAKE 1 TABLET BY MOUTH EVERY DAY, Disp: 30 tablet, Rfl: 8   SUMAtriptan (IMITREX) 100 MG tablet, TAKE 1 TABLET FOR MIGRAINE AS NEEDED. MAY REPEAT IN 2 HOURS IF HEADACHE PERSISTS. MAX 2 TABS PER DAY, Disp: 10 tablet, Rfl: 0  Current Facility-Administered Medications:    betamethasone acetate-betamethasone sodium phosphate (CELESTONE) injection 3 mg, 3 mg, Intra-articular, Once, Evans, Larena Glassman, DPM   Family History  Problem Relation Age of Onset   Autoimmune disease Mother    Heart attack Father 59   Cancer - Colon Maternal Grandmother    Alzheimer's disease Maternal Grandmother    Heart attack Maternal Grandfather    Bone cancer Paternal Grandmother      Social History   Tobacco Use   Smoking status: Never   Smokeless tobacco: Never  Vaping Use   Vaping status: Never Used  Substance Use Topics   Alcohol use: Yes    Alcohol/week: 1.0 standard drink of alcohol    Types: 1 Glasses of wine per week    Comment: glass of wine once a week   Drug use: Never    Allergies as of 03/26/2023 - Review Complete 03/26/2023  Allergen Reaction Noted   Meperidine Swelling and Nausea Only 06/13/2014   Hydrocodone Nausea And Vomiting, Swelling, and Nausea Only 06/13/2014   Hydromorphone Nausea And Vomiting and Nausea Only 11/04/2014   Opium Other (See Comments) 06/13/2014   Oxycodone Nausea And Vomiting and Nausea Only 06/13/2014    Review of Systems:    All systems reviewed and negative except where noted in HPI.   Physical Exam:  BP 102/63   Pulse 67   Temp 97.9 F (36.6 C) (Oral)    Ht 5\' 8"  (1.727 m)   Wt 159 lb 6.4 oz (72.3 kg)   BMI 24.24 kg/m  No LMP recorded. Patient has had a hysterectomy.  General:   Alert,  Well-developed, well-nourished, pleasant and cooperative in NAD Head:  Normocephalic and atraumatic. Eyes:  Sclera clear, no icterus.   Conjunctiva pink. Ears:  Normal auditory acuity. Nose:  No deformity, discharge, or lesions. Mouth:  No deformity or lesions,oropharynx pink & moist. Neck:  Supple; no masses or thyromegaly. Lungs:  Respirations even and unlabored.  Clear throughout to auscultation.   No wheezes, crackles, or rhonchi. No acute distress. Heart:  Regular rate and rhythm; no murmurs, clicks, rubs, or gallops. Abdomen:  Normal bowel sounds. Soft, non-tender and non-distended without masses, hepatosplenomegaly or hernias noted.  No guarding or rebound tenderness.   Rectal: Not performed  Msk:  Symmetrical without gross deformities. Good, equal movement & strength bilaterally. Pulses:  Normal pulses noted. Extremities:  No clubbing or edema.  No cyanosis. Neurologic:  Alert and oriented x3;  grossly normal neurologically. Skin:  Intact without significant lesions or rashes. No jaundice. Psych:  Alert and cooperative. Normal mood and affect.  Imaging Studies: No abdominal imaging  Assessment and Plan:   Lisa Gonzalez is a 62 y.o. female with with history of anxiety, PTSD, chronic GERD on intermittent PPI despite avoidance of dietary triggers, antireflux lifestyle as well as symptoms of dyspepsia  Recommend EGD with esophageal biopsies to rule out eosinophilic esophagitis as well as duodenal biopsies to rule out celiac disease She reports being ruled out H. pylori several times in the past Decrease Protonix to once a day because of constipation as a possible side effect Advised to start taking MiraLAX daily to treat constipation  Follow up after the above workup   Arlyss Repress, MD

## 2023-03-27 ENCOUNTER — Ambulatory Visit: Payer: 59 | Admitting: Anesthesiology

## 2023-03-27 ENCOUNTER — Ambulatory Visit
Admission: RE | Admit: 2023-03-27 | Discharge: 2023-03-27 | Disposition: A | Discharge status: Home / Self Care | Payer: 59 | Attending: Gastroenterology | Admitting: Gastroenterology

## 2023-03-27 ENCOUNTER — Encounter: Payer: Self-pay | Admitting: Gastroenterology

## 2023-03-27 ENCOUNTER — Encounter
Admission: RE | Disposition: A | Discharge status: Home / Self Care | Payer: Self-pay | Source: Home / Self Care | Attending: Gastroenterology

## 2023-03-27 DIAGNOSIS — F419 Anxiety disorder, unspecified: Secondary | ICD-10-CM | POA: Diagnosis not present

## 2023-03-27 DIAGNOSIS — K449 Diaphragmatic hernia without obstruction or gangrene: Secondary | ICD-10-CM | POA: Diagnosis not present

## 2023-03-27 DIAGNOSIS — K2289 Other specified disease of esophagus: Secondary | ICD-10-CM | POA: Diagnosis not present

## 2023-03-27 DIAGNOSIS — K209 Esophagitis, unspecified without bleeding: Secondary | ICD-10-CM

## 2023-03-27 DIAGNOSIS — K219 Gastro-esophageal reflux disease without esophagitis: Secondary | ICD-10-CM

## 2023-03-27 DIAGNOSIS — R1013 Epigastric pain: Secondary | ICD-10-CM | POA: Diagnosis not present

## 2023-03-27 HISTORY — PX: ESOPHAGOGASTRODUODENOSCOPY (EGD) WITH PROPOFOL: SHX5813

## 2023-03-27 HISTORY — PX: BIOPSY: SHX5522

## 2023-03-27 SURGERY — ESOPHAGOGASTRODUODENOSCOPY (EGD) WITH PROPOFOL
Anesthesia: General

## 2023-03-27 MED ORDER — LIDOCAINE HCL (CARDIAC) PF 100 MG/5ML IV SOSY
PREFILLED_SYRINGE | INTRAVENOUS | Status: DC | PRN
  Administered 2023-03-27: 60 mg via INTRAVENOUS

## 2023-03-27 MED ORDER — PROPOFOL 10 MG/ML IV BOLUS
INTRAVENOUS | Status: DC | PRN
  Administered 2023-03-27: 50 mg via INTRAVENOUS

## 2023-03-27 MED ORDER — SODIUM CHLORIDE 0.9 % IV SOLN: INTRAVENOUS | Status: DC

## 2023-03-27 MED ORDER — PROPOFOL 500 MG/50ML IV EMUL
INTRAVENOUS | Status: DC | PRN
  Administered 2023-03-27: 125 ug/kg/min via INTRAVENOUS

## 2023-03-27 MED ORDER — DEXMEDETOMIDINE HCL IN NACL 200 MCG/50ML IV SOLN
INTRAVENOUS | Status: DC | PRN
  Administered 2023-03-27: 20 ug via INTRAVENOUS

## 2023-03-27 NOTE — Anesthesia Preprocedure Evaluation (Signed)
Anesthesia Evaluation  Patient identified by MRN, date of birth, ID band Patient awake    Reviewed: Allergy & Precautions, NPO status , Patient's Chart, lab work & pertinent test results  Airway Mallampati: II  TM Distance: >3 FB Neck ROM: Full    Dental  (+) Teeth Intact   Pulmonary neg pulmonary ROS   Pulmonary exam normal        Cardiovascular Exercise Tolerance: Good negative cardio ROS Normal cardiovascular exam Rhythm:Regular Rate:Normal     Neuro/Psych   Anxiety     negative neurological ROS  negative psych ROS   GI/Hepatic negative GI ROS, Neg liver ROS,GERD  Medicated,,  Endo/Other  negative endocrine ROS    Renal/GU negative Renal ROS  negative genitourinary   Musculoskeletal   Abdominal Normal abdominal exam  (+)   Peds negative pediatric ROS (+)  Hematology negative hematology ROS (+)   Anesthesia Other Findings Past Medical History: No date: Allergy No date: Anxiety No date: Back pain No date: Cyst of left breast No date: GERD (gastroesophageal reflux disease) No date: History of endometriosis No date: History of ovarian cyst No date: PTSD (post-traumatic stress disorder) No date: Tinnitus  Past Surgical History: 03/21/2021: COLONOSCOPY WITH PROPOFOL; N/A     Comment:  Procedure: COLONOSCOPY WITH PROPOFOL;  Surgeon:               Pasty Spillers, MD;  Location: [facility] ENDOSCOPY;                Service: Endoscopy;  Laterality: N/A; 05/08/2022: COLONOSCOPY WITH PROPOFOL; N/A     Comment:  Procedure: COLONOSCOPY WITH PROPOFOL;  Surgeon: Toney Reil, MD;  Location: [facility] ENDOSCOPY;  Service:               Gastroenterology;  Laterality: N/A; No date: LAPAROSCOPIC VAGINAL HYSTERECTOMY 2018: SPINAL FUSION No date: SPINE SURGERY  BMI    Body Mass Index: 24.02 kg/m      Reproductive/Obstetrics negative OB ROS                              Anesthesia Physical Anesthesia Plan  ASA: 1  Anesthesia Plan: General   Post-op Pain Management:    Induction: Intravenous  PONV Risk Score and Plan: Propofol infusion and TIVA  Airway Management Planned: Natural Airway and Nasal Cannula  Additional Equipment:   Intra-op Plan:   Post-operative Plan:   Informed Consent: I have reviewed the patients History and Physical, chart, labs and discussed the procedure including the risks, benefits and alternatives for the proposed anesthesia with the patient or authorized representative who has indicated his/her understanding and acceptance.     Dental Advisory Given  Plan Discussed with: CRNA and Surgeon  Anesthesia Plan Comments:        Anesthesia Quick Evaluation

## 2023-03-27 NOTE — Transfer of Care (Signed)
Immediate Anesthesia Transfer of Care Note  Patient: BRINAE REINHOLTZ  Procedure(s) Performed: ESOPHAGOGASTRODUODENOSCOPY (EGD) WITH PROPOFOL  Patient Location: PACU  Anesthesia Type:General  Level of Consciousness: sedated  Airway & Oxygen Therapy: Patient Spontanous Breathing  Post-op Assessment: Report given to RN and Post -op Vital signs reviewed and stable  Post vital signs: Reviewed and stable  Last Vitals:  Vitals Value Taken Time  BP 87/57 03/27/23 1119  Temp 36.3 C 03/27/23 1119  Pulse 48 03/27/23 1122  Resp 17 03/27/23 1119  SpO2 97 % 03/27/23 1122  Vitals shown include unfiled device data.  Last Pain:  Vitals:   03/27/23 1119  TempSrc: Temporal  PainSc: 0-No pain         Complications: No notable events documented.

## 2023-03-27 NOTE — Op Note (Signed)
[facility] Gastroenterology Patient Name: Lisa Gonzalez Procedure Date: 03/27/2023 11:02 AM MRN: [id number] Account #: [id number] Date of Birth: 1960-09-08 Admit Type: Outpatient Age: 62 Room: [facility] ENDO ROOM 3 Gender: Female Note Status: Finalized Instrument Name: Upper Endoscope [id number] Procedure:             Upper GI endoscopy Indications:           Dyspepsia, Follow-up of gastro-esophageal reflux                         disease Providers:             Toney Reil MD, MD Referring MD:          Yehuda Mao. Birdie Sons (Referring MD) Medicines:             General Anesthesia Complications:         No immediate complications. Estimated blood loss: None. Procedure:             Pre-Anesthesia Assessment:                        - Prior to the procedure, a History and Physical was                         performed, and patient medications and allergies were                         reviewed. The patient is competent. The risks and                         benefits of the procedure and the sedation options and                         risks were discussed with the patient. All questions                         were answered and informed consent was obtained.                         Patient identification and proposed procedure were                         verified by the physician, the nurse, the                         anesthesiologist, the anesthetist and the technician                         in the pre-procedure area in the procedure room in the                         endoscopy suite. Mental Status Examination: alert and                         oriented. Airway Examination: normal oropharyngeal                         airway and neck mobility. Respiratory Examination:  clear to auscultation. CV Examination: normal.                         Prophylactic Antibiotics: The patient does not require                         prophylactic  antibiotics. Prior Anticoagulants: The                         patient has taken no anticoagulant or antiplatelet                         agents. ASA Grade Assessment: I - A normal, healthy                         patient. After reviewing the risks and benefits, the                         patient was deemed in satisfactory condition to                         undergo the procedure. The anesthesia plan was to use                         general anesthesia. Immediately prior to                         administration of medications, the patient was                         re-assessed for adequacy to receive sedatives. The                         heart rate, respiratory rate, oxygen saturations,                         blood pressure, adequacy of pulmonary ventilation, and                         response to care were monitored throughout the                         procedure. The physical status of the patient was                         re-assessed after the procedure.                        After obtaining informed consent, the endoscope was                         passed under direct vision. Throughout the procedure,                         the patient's blood pressure, pulse, and oxygen                         saturations were monitored continuously. The  Endosonoscope was introduced through the mouth, and                         advanced to the second part of duodenum. The upper GI                         endoscopy was accomplished without difficulty. The                         patient tolerated the procedure well. Findings:      The duodenal bulb and second portion of the duodenum were normal.       Biopsies for histology were taken with a cold forceps for evaluation of       celiac disease.      The entire examined stomach was normal.      A small hiatal hernia was present.      The cardia and gastric fundus were normal on retroflexion.      Esophagogastric  landmarks were identified: the gastroesophageal junction       was found at 35 cm from the incisors.      The gastroesophageal junction and examined esophagus were normal.       Biopsies were taken with a cold forceps for histology. Impression:            - Normal duodenal bulb and second portion of the                         duodenum. Biopsied.                        - Normal stomach.                        - Small hiatal hernia.                        - Esophagogastric landmarks identified.                        - Normal gastroesophageal junction and esophagus.                         Biopsied. Recommendation:        - Await pathology results.                        - Discharge patient to home (with escort).                        - Resume previous diet today.                        - Continue present medications. Procedure Code(s):     --- Professional ---                        [phone], Esophagogastroduodenoscopy, flexible,                         transoral; with biopsy, single or multiple Diagnosis Code(s):     --- Professional ---  K44.9, Diaphragmatic hernia without obstruction or                         gangrene                        R10.13, Epigastric pain                        K21.9, Gastro-esophageal reflux disease without                         esophagitis CPT copyright 2022 American Medical Association. All rights reserved. The codes documented in this report are preliminary and upon coder review may  be revised to meet current compliance requirements. Dr. Libby Maw Toney Reil MD, MD 03/27/2023 11:17:47 AM This report has been signed electronically. Number of Addenda: 0 Note Initiated On: 03/27/2023 11:02 AM Estimated Blood Loss:  Estimated blood loss: none.      [facility]

## 2023-03-27 NOTE — H&P (Signed)
Arlyss Repress, MD [address]  [address]  [place], [place] [address]  Main: [phone]  Fax: [phone] Pager: [phone]  Primary Care Physician:  Glori Luis, MD Primary Gastroenterologist:  Dr. Arlyss Repress  Pre-Procedure History & Physical: HPI:  Lisa Gonzalez is a 63 y.o. female is here for an endoscopy.   Past Medical History:  Diagnosis Date   Allergy    Anxiety    Back pain    Cyst of left breast    GERD (gastroesophageal reflux disease)    History of endometriosis    History of ovarian cyst    PTSD (post-traumatic stress disorder)    Tinnitus     Past Surgical History:  Procedure Laterality Date   COLONOSCOPY WITH PROPOFOL N/A 03/21/2021   Procedure: COLONOSCOPY WITH PROPOFOL;  Surgeon: Pasty Spillers, MD;  Location: [facility] ENDOSCOPY;  Service: Endoscopy;  Laterality: N/A;   COLONOSCOPY WITH PROPOFOL N/A 05/08/2022   Procedure: COLONOSCOPY WITH PROPOFOL;  Surgeon: Toney Reil, MD;  Location: [facility] ENDOSCOPY;  Service: Gastroenterology;  Laterality: N/A;   LAPAROSCOPIC VAGINAL HYSTERECTOMY     SPINAL FUSION  2018   SPINE SURGERY      Prior to Admission medications   Medication Sig Start Date End Date Taking? Authorizing Provider  celecoxib (CELEBREX) 100 MG capsule TAKE 1 CAPSULE BY MOUTH TWICE A DAY 12/04/22  Yes Susanne Borders, PA  escitalopram (LEXAPRO) 10 MG tablet Take 10 mg by mouth daily. 03/02/23  Yes [provider]  eszopiclone (LUNESTA) 2 MG TABS tablet Take 2 mg by mouth at bedtime as needed.   Yes [provider]  famotidine (PEPCID) 20 MG tablet Take 20 mg by mouth at bedtime.   Yes [provider]  methocarbamol (ROBAXIN) 500 MG tablet Take 1 tablet (500 mg total) by mouth every 8 (eight) hours as needed. 08/07/22  Yes Glori Luis, MD  pantoprazole (PROTONIX) 40 MG tablet TAKE 1 TABLET BY MOUTH EVERY DAY 01/30/23  Yes Glori Luis, MD  SUMAtriptan (IMITREX) 100 MG tablet  TAKE 1 TABLET FOR MIGRAINE AS NEEDED. MAY REPEAT IN 2 HOURS IF HEADACHE PERSISTS. MAX 2 TABS PER DAY 01/01/23  Yes Glori Luis, MD  estradiol (ESTRACE VAGINAL) 0.1 MG/GM vaginal cream Insert 1 g vaginally nightly for 1 week, then once wkly as maintenance 03/17/23   Copland, Alicia B, PA-C  estradiol (VIVELLE-DOT) 0.05 MG/24HR patch Place 1 patch (0.05 mg total) onto the skin 2 (two) times a week. 03/17/23   Copland, Helmut Muster B, PA-C  Eszopiclone 3 MG TABS TAKE 1 TABLET BY MOUTH AT BEDTIME TAKE IMMEDIATELY BEFORE BEDTIME 01/15/23   Glori Luis, MD  loratadine (CLARITIN) 10 MG tablet Take 10 mg by mouth daily as needed.    [provider]    Allergies as of 03/26/2023 - Review Complete 03/26/2023  Allergen Reaction Noted   Meperidine Swelling and Nausea Only 06/13/2014   Hydrocodone Nausea And Vomiting, Swelling, and Nausea Only 06/13/2014   Hydromorphone Nausea And Vomiting and Nausea Only 11/04/2014   Opium Other (See Comments) 06/13/2014   Oxycodone Nausea And Vomiting and Nausea Only 06/13/2014    Family History  Problem Relation Age of Onset   Autoimmune disease Mother    Heart attack Father 3   Cancer - Colon Maternal Grandmother    Alzheimer's disease Maternal Grandmother    Heart attack Maternal Grandfather    Bone cancer Paternal Grandmother     Social History  Socioeconomic History   Marital status: Married    Spouse name: Not on file   Number of children: Not on file   Years of education: Not on file   Highest education level: Master's degree (e.g., MA, MS, MEng, MEd, MSW, MBA)  Occupational History   Not on file  Tobacco Use   Smoking status: Never   Smokeless tobacco: Never  Vaping Use   Vaping status: Never Used  Substance and Sexual Activity   Alcohol use: Yes    Alcohol/week: 1.0 standard drink of alcohol    Types: 1 Glasses of wine per week    Comment: glass of wine once a week   Drug use: Never   Sexual activity: Yes    Birth  control/protection: Surgical    Comment: Hysterectomy  Other Topics Concern   Not on file  Social History Narrative   Not on file   Social Determinants of Health   Financial Resource Strain: Low Risk  (01/13/2023)   Overall Financial Resource Strain (CARDIA)    Difficulty of Paying Living Expenses: Not hard at all  Food Insecurity: No Food Insecurity (01/13/2023)   Hunger Vital Sign    Worried About Running Out of Food in the Last Year: Never true    Ran Out of Food in the Last Year: Never true  Transportation Needs: No Transportation Needs (01/13/2023)   PRAPARE - Administrator, Civil Service (Medical): No    Lack of Transportation (Non-Medical): No  Physical Activity: Insufficiently Active (01/13/2023)   Exercise Vital Sign    Days of Exercise per Week: 3 days    Minutes of Exercise per Session: 30 min  Stress: No Stress Concern Present (01/13/2023)   [employer] of Occupational Health - Occupational Stress Questionnaire    Feeling of Stress : Not at all  Social Connections: Socially Integrated (01/13/2023)   Social Connection and Isolation Panel [NHANES]    Frequency of Communication with Friends and Family: More than three times a week    Frequency of Social Gatherings with Friends and Family: Once a week    Attends Religious Services: More than 4 times per year    Active Member of [employer] or Organizations: Yes    Attends Engineer, structural: More than 4 times per year    Marital Status: Married  Catering manager Violence: Not on file    Review of Systems: See HPI, otherwise negative ROS  Physical Exam: BP 96/65   Pulse (!) 55   Temp (!) 97 F (36.1 C) (Temporal)   Resp 16   Wt 71.7 kg   SpO2 100%   BMI 24.02 kg/m  General:   Alert,  pleasant and cooperative in NAD Head:  Normocephalic and atraumatic. Neck:  Supple; no masses or thyromegaly. Lungs:  Clear throughout to auscultation.    Heart:  Regular rate and rhythm. Abdomen:  Soft,  nontender and nondistended. Normal bowel sounds, without guarding, and without rebound.   Neurologic:  Alert and  oriented x4;  grossly normal neurologically.  Impression/Plan: Lisa Gonzalez is here for an endoscopy to be performed for chronic GERD, dyspepsia  Risks, benefits, limitations, and alternatives regarding  endoscopy have been reviewed with the patient.  Questions have been answered.  All parties agreeable.   Lannette Donath, MD  03/27/2023, 10:38 AM

## 2023-03-27 NOTE — Anesthesia Postprocedure Evaluation (Signed)
Anesthesia Post Note  Patient: Lisa Gonzalez  Procedure(s) Performed: ESOPHAGOGASTRODUODENOSCOPY (EGD) WITH PROPOFOL BIOPSY  Patient location during evaluation: PACU Anesthesia Type: General Level of consciousness: awake and awake and alert Pain management: satisfactory to patient Vital Signs Assessment: post-procedure vital signs reviewed and stable Respiratory status: spontaneous breathing Cardiovascular status: stable Anesthetic complications: no   No notable events documented.   Last Vitals:  Vitals:   03/27/23 1119 03/27/23 1140  BP: (!) 87/57 (!) 85/60  Pulse: (!) 47 (!) 50  Resp: 17   Temp: (!) 36.3 C   SpO2: 99% 100%    Last Pain:  Vitals:   03/27/23 1140  TempSrc:   PainSc: 0-No pain                 VAN STAVEREN,Kimmi Acocella

## 2023-03-28 LAB — SURGICAL PATHOLOGY

## 2023-04-02 DIAGNOSIS — M4802 Spinal stenosis, cervical region: Secondary | ICD-10-CM | POA: Diagnosis not present

## 2023-04-02 DIAGNOSIS — M5412 Radiculopathy, cervical region: Secondary | ICD-10-CM | POA: Diagnosis not present

## 2023-04-07 ENCOUNTER — Telehealth: Payer: Self-pay

## 2023-04-07 MED ORDER — EOHILIA 2 MG/10ML PO SUSP: 2.0000 mg | Freq: Two times a day (BID) | ORAL | 3 refills | Status: DC

## 2023-04-07 NOTE — Telephone Encounter (Signed)
Called and left a message for call back sent medication to the pharmacy

## 2023-04-07 NOTE — Telephone Encounter (Signed)
-----   Message from [facility] sent at 04/04/2023 12:02 PM EST ----- Called patient to discuss pathology results.  Had to leave a voicemail.  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  Recommend treatment with Lenox Ahr.  Hopefully this medication will be approved  RV

## 2023-04-07 NOTE — Telephone Encounter (Signed)
Patient called back and verbalized understanding of results she states her pharmacy has already called and said the medication was ready. She states that she will make a follow up appointment in 12 weeks

## 2023-04-09 ENCOUNTER — Other Ambulatory Visit: Payer: Self-pay | Admitting: Gastroenterology

## 2023-04-09 ENCOUNTER — Telehealth: Payer: Self-pay

## 2023-04-09 DIAGNOSIS — K208 Other esophagitis without bleeding: Secondary | ICD-10-CM

## 2023-04-09 NOTE — Telephone Encounter (Signed)
Patient called and states the pharmacy states that she needed a PA on the Budesonide and with out Insurance it was 2000 dollars. Went to cover my meds and submitted the PA online. Waiting on response from insurance company

## 2023-04-09 NOTE — Telephone Encounter (Signed)
Called and informed patient and patient verbalized understanding

## 2023-04-09 NOTE — Telephone Encounter (Signed)
Approved medication through 04/09/2023 to 04/08/2024

## 2023-04-09 NOTE — Telephone Encounter (Signed)
ERROR

## 2023-04-22 ENCOUNTER — Telehealth: Payer: Self-pay | Admitting: Gastroenterology

## 2023-04-22 NOTE — Telephone Encounter (Signed)
Patient is calling to find out if she still needed to take the [facility] because she thought she just needed it for 2 weeks. Informed her no it was for 12 weeks she needed to keep taking the medication. She states okay she will do this

## 2023-04-22 NOTE — Telephone Encounter (Signed)
Patient called in wanting to know how long she is supposed to take the medication. She said that after her procedure she was told to take the medication for two weeks.

## 2023-04-30 DIAGNOSIS — M461 Sacroiliitis, not elsewhere classified: Secondary | ICD-10-CM | POA: Diagnosis not present

## 2023-05-01 DIAGNOSIS — F5105 Insomnia due to other mental disorder: Secondary | ICD-10-CM | POA: Diagnosis not present

## 2023-05-01 DIAGNOSIS — F419 Anxiety disorder, unspecified: Secondary | ICD-10-CM | POA: Diagnosis not present

## 2023-05-01 DIAGNOSIS — F99 Mental disorder, not otherwise specified: Secondary | ICD-10-CM | POA: Diagnosis not present

## 2023-05-27 ENCOUNTER — Encounter: Payer: Self-pay | Admitting: Obstetrics and Gynecology

## 2023-05-27 DIAGNOSIS — R2232 Localized swelling, mass and lump, left upper limb: Secondary | ICD-10-CM

## 2023-06-04 ENCOUNTER — Ambulatory Visit: Payer: BLUE CROSS/BLUE SHIELD

## 2023-06-05 NOTE — Telephone Encounter (Signed)
 I need to schedule an axillary u/s at [facility] for pt and changed order in system. Probably need to fax that in?

## 2023-06-05 NOTE — Addendum Note (Signed)
 Addended by: Althea Grimmer B on: 06/05/2023 10:17 AM   Modules accepted: Orders

## 2023-06-09 NOTE — Addendum Note (Signed)
 Addended by: Althea Grimmer B on: 06/09/2023 03:18 PM   Modules accepted: Orders

## 2023-06-18 ENCOUNTER — Other Ambulatory Visit: Payer: Self-pay | Admitting: Gastroenterology

## 2023-06-18 NOTE — Telephone Encounter (Signed)
Last office visit 03/26/2023  EGD 03/27/2023 was told to take [facility] for 12 weeks  Last refill 04/07/2023 10ml 3 refills  Has appointment 07/28/2023

## 2023-06-20 ENCOUNTER — Other Ambulatory Visit: Payer: Self-pay | Admitting: Obstetrics and Gynecology

## 2023-06-20 DIAGNOSIS — Z79890 Hormone replacement therapy (postmenopausal): Secondary | ICD-10-CM

## 2023-06-20 DIAGNOSIS — N951 Menopausal and female climacteric states: Secondary | ICD-10-CM

## 2023-07-01 DIAGNOSIS — F419 Anxiety disorder, unspecified: Secondary | ICD-10-CM | POA: Diagnosis not present

## 2023-07-01 DIAGNOSIS — F5105 Insomnia due to other mental disorder: Secondary | ICD-10-CM | POA: Diagnosis not present

## 2023-07-01 DIAGNOSIS — F99 Mental disorder, not otherwise specified: Secondary | ICD-10-CM | POA: Diagnosis not present

## 2023-07-02 ENCOUNTER — Ambulatory Visit
Admission: RE | Admit: 2023-07-02 | Discharge: 2023-07-02 | Disposition: A | Discharge status: Home / Self Care | Payer: 59 | Source: Ambulatory Visit | Attending: Obstetrics and Gynecology | Admitting: Obstetrics and Gynecology

## 2023-07-02 DIAGNOSIS — R2232 Localized swelling, mass and lump, left upper limb: Secondary | ICD-10-CM | POA: Diagnosis not present

## 2023-07-03 ENCOUNTER — Encounter: Payer: Self-pay | Admitting: Obstetrics and Gynecology

## 2023-07-10 DIAGNOSIS — M542 Cervicalgia: Secondary | ICD-10-CM | POA: Diagnosis not present

## 2023-07-10 DIAGNOSIS — M25551 Pain in right hip: Secondary | ICD-10-CM | POA: Diagnosis not present

## 2023-07-10 DIAGNOSIS — M25651 Stiffness of right hip, not elsewhere classified: Secondary | ICD-10-CM | POA: Diagnosis not present

## 2023-07-21 DIAGNOSIS — F419 Anxiety disorder, unspecified: Secondary | ICD-10-CM | POA: Diagnosis not present

## 2023-07-21 DIAGNOSIS — Z01818 Encounter for other preprocedural examination: Secondary | ICD-10-CM | POA: Diagnosis not present

## 2023-07-21 DIAGNOSIS — Z8659 Personal history of other mental and behavioral disorders: Secondary | ICD-10-CM | POA: Diagnosis not present

## 2023-07-21 DIAGNOSIS — K219 Gastro-esophageal reflux disease without esophagitis: Secondary | ICD-10-CM | POA: Diagnosis not present

## 2023-07-21 DIAGNOSIS — G5762 Lesion of plantar nerve, left lower limb: Secondary | ICD-10-CM | POA: Diagnosis not present

## 2023-07-23 ENCOUNTER — Other Ambulatory Visit: Payer: Self-pay

## 2023-07-28 ENCOUNTER — Ambulatory Visit: Payer: 59 | Admitting: Gastroenterology

## 2023-07-28 ENCOUNTER — Encounter: Payer: Self-pay | Admitting: Gastroenterology

## 2023-07-28 VITALS — BP 110/73 | HR 81 | Temp 98.4°F | Ht 68.0 in | Wt 167.0 lb

## 2023-07-28 DIAGNOSIS — K208 Other esophagitis without bleeding: Secondary | ICD-10-CM | POA: Diagnosis not present

## 2023-07-28 NOTE — Progress Notes (Unsigned)
 Lisa Repress, MD [address]  [address]  [place], [place] [address]  Main: [phone]  Fax: [phone]    Gastroenterology Consultation  Referring Provider:     Glori Luis, MD Primary Care Physician:  Glori Luis, MD (Inactive) Primary Gastroenterologist:  Dr. Arlyss Gonzalez Reason for Consultation: Chronic GERD, dyspepsia        HPI:   Lisa Gonzalez is a 63 y.o. female referred by Dr. Birdie Sons, Yehuda Mao, MD (Inactive)  for consultation & management of symptoms of burning in the chest, epigastric discomfort, bloating as well as intermittent hard stools.  She reports having several years history approximately more than 25 years history of intermittent heartburn, she was taking over-the-counter/prescription acid suppressive medications as needed during flareups along with avoidance of dietary triggers.  Most recently, she was on antibiotics for eye infection which triggered her acid reflux, started on pantoprazole 40 mg p.o. twice daily which provided significant relief.  She also noticed that her bowel movements were getting harder associated with some abdominal bloating.  She has given up coffee recently as well.  She cannot have anything to eat after 3 PM.  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   NSAIDs: None  Antiplts/Anticoagulants/Anti thrombotics: None  GI Procedures:  Upper endoscopy 03/27/2023 - Normal duodenal bulb and second portion of the duodenum. Biopsied. - Normal stomach. - Small hiatal hernia. - Esophagogastric landmarks identified. - Normal  gastroesophageal junction and esophagus. Biopsied.  1. Duodenum, Biopsy, cbx :       - UNREMARKABLE DUODENAL MUCOSA.       - NEGATIVE FOR FEATURES OF CELIAC, DYSPLASIA, AND MALIGNANCY.        2. Esophagus, biopsy, cbx :       - SQUAMOUS MUCOSA WITH INTRAEPITHELIAL LYMPHOCYTOSIS, SPONGIOSIS, FOCAL       DYSKERATOTIC KERATINOCYTES, AND ACUTE INFLAMMATION, CONSISTENT WITH A       LYMPHOCYTIC ESOPHAGITIS PATTERN, SEE COMMENT.       - PASF STAIN IS NEGATIVE; STAIN CONTROL WORKED APPROPRIATELY.       - NEGATIVE FOR DYSPLASIA AND MALIGNANCY.    Colonoscopy 05/08/2022 for surveillance of colon polyps - One 3 mm polyp in the ascending colon, removed with a cold snare. Complete resection. Polyp tissue not retrieved. - The distal rectum and anal verge are normal on retroflexion view. - Diverticulosis in the recto- sigmoid colon, in the sigmoid colon and in the transverse colon. - The examination was otherwise normal.  Colonoscopy 03/21/2021 DIAGNOSIS:  A. COLON POLYP, CECUM; COLD BIOPSY:  - SESSILE SERRATED POLYP.  - NEGATIVE FOR DYSPLASIA AND MALIGNANCY.   B. COLON POLYP, HEPATIC FLEXURE; COLD BIOPSY:  - SESSILE SERRATED POLYP.  - NEGATIVE FOR DYSPLASIA AND MALIGNANCY.   C. COLON POLYP, TRANSVERSE; COLD SNARE:  - SESSILE SERRATED POLYP.  - NEGATIVE FOR DYSPLASIA AND MALIGNANCY.   D. RECTAL POLYP; COLD BIOPSY:  - HYPERPLASTIC POLYP.  - NEGATIVE FOR DYSPLASIA AND MALIGNANCY.   Past Medical History:  Diagnosis Date   Allergy    Anxiety    Back pain    Cyst  of left breast    GERD (gastroesophageal reflux disease)    History of endometriosis    History of ovarian cyst    PTSD (post-traumatic stress disorder)    Tinnitus     Past Surgical History:  Procedure Laterality Date   BIOPSY  03/27/2023   Procedure: BIOPSY;  Surgeon: Toney Reil, MD;  Location: [facility] ENDOSCOPY;  Service: Gastroenterology;;   COLONOSCOPY WITH PROPOFOL N/A 03/21/2021   Procedure: COLONOSCOPY WITH  PROPOFOL;  Surgeon: Pasty Spillers, MD;  Location: [facility] ENDOSCOPY;  Service: Endoscopy;  Laterality: N/A;   COLONOSCOPY WITH PROPOFOL N/A 05/08/2022   Procedure: COLONOSCOPY WITH PROPOFOL;  Surgeon: Toney Reil, MD;  Location: [facility] ENDOSCOPY;  Service: Gastroenterology;  Laterality: N/A;   ESOPHAGOGASTRODUODENOSCOPY (EGD) WITH PROPOFOL N/A 03/27/2023   Procedure: ESOPHAGOGASTRODUODENOSCOPY (EGD) WITH PROPOFOL;  Surgeon: Toney Reil, MD;  Location: [facility] ENDOSCOPY;  Service: Gastroenterology;  Laterality: N/A;   LAPAROSCOPIC VAGINAL HYSTERECTOMY     SPINAL FUSION  2018   SPINE SURGERY       Current Outpatient Medications:    ALPRAZolam (XANAX) 0.5 MG tablet, Take 0.5 mg by mouth 3 (three) times daily as needed for anxiety., Disp: , Rfl:    ascorbic acid (VITAMIN C) 500 MG tablet, Take 500 mg by mouth daily., Disp: , Rfl:    celecoxib (CELEBREX) 100 MG capsule, TAKE 1 CAPSULE BY MOUTH TWICE A DAY, Disp: 60 capsule, Rfl: 1   Cholecalciferol (VITAMIN D-1000 MAX ST) 25 MCG (1000 UT) tablet, Take 1,000 Units by mouth daily., Disp: , Rfl:    escitalopram (LEXAPRO) 10 MG tablet, Take 10 mg by mouth daily., Disp: , Rfl:    estradiol (ESTRACE VAGINAL) 0.1 MG/GM vaginal cream, Insert 1 g vaginally nightly for 1 week, then once wkly as maintenance, Disp: 42.5 g, Rfl: 1   estradiol (VIVELLE-DOT) 0.05 MG/24HR patch, Place 1 patch (0.05 mg total) onto the skin 2 (two) times a week., Disp: 24 patch, Rfl: 3   eszopiclone (LUNESTA) 2 MG TABS tablet, Take 2 mg by mouth at bedtime as needed., Disp: , Rfl:    Eszopiclone 3 MG TABS, TAKE 1 TABLET BY MOUTH AT BEDTIME TAKE IMMEDIATELY BEFORE BEDTIME, Disp: 30 tablet, Rfl: 0   famotidine (PEPCID) 20 MG tablet, Take 20 mg by mouth at bedtime., Disp: , Rfl:    loratadine (CLARITIN) 10 MG tablet, Take 10 mg by mouth daily as needed., Disp: , Rfl:    methocarbamol (ROBAXIN) 500 MG tablet, Take 1 tablet (500 mg total) by mouth every 8 (eight) hours as  needed., Disp: 15 tablet, Rfl: 0   Multiple Vitamin (MULTI-VITAMIN) tablet, Take 1 tablet by mouth daily., Disp: , Rfl:    pantoprazole (PROTONIX) 40 MG tablet, TAKE 1 TABLET BY MOUTH EVERY DAY, Disp: 30 tablet, Rfl: 8   SUMAtriptan (IMITREX) 100 MG tablet, TAKE 1 TABLET FOR MIGRAINE AS NEEDED. MAY REPEAT IN 2 HOURS IF HEADACHE PERSISTS. MAX 2 TABS PER DAY, Disp: 10 tablet, Rfl: 0   Vitamin E (VITAMIN E/D-ALPHA NATURAL) 268 MG (400 UNIT) CAPS, Take by mouth., Disp: , Rfl:    Budesonide (EOHILIA) 2 MG/10ML SUSP, TAKE 2 MG BY MOUTH 2 (TWO) TIMES DAILY. (Patient not taking: Reported on 07/28/2023), Disp: 10 mL, Rfl: 1  Current Facility-Administered Medications:    betamethasone acetate-betamethasone sodium phosphate (CELESTONE) injection 3 mg, 3 mg, Intra-articular, Once, Felecia Shelling, DPM   Family History  Problem Relation Age of Onset   Autoimmune disease Mother  Heart attack Father 71   Cancer - Colon Maternal Grandmother    Alzheimer's disease Maternal Grandmother    Heart attack Maternal Grandfather    Bone cancer Paternal Grandmother      Social History   Tobacco Use   Smoking status: Never   Smokeless tobacco: Never  Vaping Use   Vaping status: Never Used  Substance Use Topics   Alcohol use: Yes    Alcohol/week: 1.0 standard drink of alcohol    Types: 1 Glasses of wine per week    Comment: glass of wine once a week   Drug use: Never    Allergies as of 07/28/2023 - Review Complete 07/28/2023  Allergen Reaction Noted   Meperidine Swelling and Nausea Only 06/13/2014   Hydrocodone Nausea And Vomiting, Swelling, and Nausea Only 06/13/2014   Hydromorphone Nausea And Vomiting and Nausea Only 11/04/2014   Opium Other (See Comments) 06/13/2014   Oxycodone Nausea And Vomiting and Nausea Only 06/13/2014    Review of Systems:    All systems reviewed and negative except where noted in HPI.   Physical Exam:  BP 110/73 (BP Location: Left Arm, Patient Position: Sitting, Cuff  Size: Normal)   Pulse 81   Temp 98.4 F (36.9 C) (Oral)   Ht 5\' 8"  (1.727 m)   Wt 167 lb (75.8 kg)   BMI 25.39 kg/m  No LMP recorded. Patient has had a hysterectomy.  General:   Alert,  Well-developed, well-nourished, pleasant and cooperative in NAD Head:  Normocephalic and atraumatic. Eyes:  Sclera clear, no icterus.   Conjunctiva pink. Ears:  Normal auditory acuity. Nose:  No deformity, discharge, or lesions. Mouth:  No deformity or lesions,oropharynx pink & moist. Neck:  Supple; no masses or thyromegaly. Lungs:  Respirations even and unlabored.  Clear throughout to auscultation.   No wheezes, crackles, or rhonchi. No acute distress. Heart:  Regular rate and rhythm; no murmurs, clicks, rubs, or gallops. Abdomen:  Normal bowel sounds. Soft, non-tender and non-distended without masses, hepatosplenomegaly or hernias noted.  No guarding or rebound tenderness.   Rectal: Not performed Msk:  Symmetrical without gross deformities. Good, equal movement & strength bilaterally. Pulses:  Normal pulses noted. Extremities:  No clubbing or edema.  No cyanosis. Neurologic:  Alert and oriented x3;  grossly normal neurologically. Skin:  Intact without significant lesions or rashes. No jaundice. Psych:  Alert and cooperative. Normal mood and affect.  Imaging Studies: No abdominal imaging  Assessment and Plan:   ALEXIA DINGER is a 63 y.o. female with with history of anxiety, PTSD, chronic GERD on intermittent PPI despite avoidance of dietary triggers, antireflux lifestyle as well as symptoms of dyspepsia  Recommend EGD with esophageal biopsies to rule out eosinophilic esophagitis as well as duodenal biopsies to rule out celiac disease She reports being ruled out H. pylori several times in the past Decrease Protonix to once a day because of constipation as a possible side effect Advised to start taking MiraLAX daily to treat constipation  Follow up after the above workup   Lisa Repress,  MD

## 2023-08-07 ENCOUNTER — Telehealth: Payer: Self-pay | Admitting: Family Medicine

## 2023-08-07 DIAGNOSIS — M542 Cervicalgia: Secondary | ICD-10-CM | POA: Diagnosis not present

## 2023-08-07 DIAGNOSIS — M25551 Pain in right hip: Secondary | ICD-10-CM | POA: Diagnosis not present

## 2023-08-07 DIAGNOSIS — M25651 Stiffness of right hip, not elsewhere classified: Secondary | ICD-10-CM | POA: Diagnosis not present

## 2023-08-07 NOTE — Telephone Encounter (Signed)
 Dr Birdie Sons is no longer at this location. Please call the office to schedule a Transfer of Care to either Dr Charlann Lange, Darleen Crocker or Kara Dies, NP

## 2023-08-13 DIAGNOSIS — G5762 Lesion of plantar nerve, left lower limb: Secondary | ICD-10-CM | POA: Diagnosis not present

## 2023-08-13 DIAGNOSIS — G8918 Other acute postprocedural pain: Secondary | ICD-10-CM | POA: Diagnosis not present

## 2023-08-28 DIAGNOSIS — M542 Cervicalgia: Secondary | ICD-10-CM | POA: Diagnosis not present

## 2023-09-11 DIAGNOSIS — M25551 Pain in right hip: Secondary | ICD-10-CM | POA: Diagnosis not present

## 2023-09-11 DIAGNOSIS — M542 Cervicalgia: Secondary | ICD-10-CM | POA: Diagnosis not present

## 2023-09-25 DIAGNOSIS — F419 Anxiety disorder, unspecified: Secondary | ICD-10-CM | POA: Diagnosis not present

## 2023-09-25 DIAGNOSIS — F99 Mental disorder, not otherwise specified: Secondary | ICD-10-CM | POA: Diagnosis not present

## 2023-09-25 DIAGNOSIS — F5105 Insomnia due to other mental disorder: Secondary | ICD-10-CM | POA: Diagnosis not present

## 2023-10-09 DIAGNOSIS — M542 Cervicalgia: Secondary | ICD-10-CM | POA: Diagnosis not present

## 2024-01-01 ENCOUNTER — Telehealth: Payer: Self-pay

## 2024-01-01 NOTE — Telephone Encounter (Signed)
 I also asked patient to reach out to [facility] to have them send the fax to her new PCP.

## 2024-01-01 NOTE — Telephone Encounter (Signed)
 We received a fax today for patient requiring a signature.  I noticed that patient was with Dr. Camellia Her, who is no longer with our practice, and has not scheduled a TOC appointment.  I spoke with patient and she states she has decided to see Dorn Pouch, MD, for primary care (outside of [facility]).  I thanked patient for letting us  know and let her know that I will make a note in the system for future reference.

## 2024-02-17 NOTE — Progress Notes (Unsigned)
 Lisa Gonzalez [facility] [address] [place] [address] Phone: [phone] Subjective:   Lisa Gonzalez am a scribe for Dr. Claudene.   I'm seeing this patient by the request  of:  Lisa Gonzalez BRAVO, MD  CC: Right hip pain  Lisa  Lisa Gonzalez Gonzalez is a 63 y.o. female coming in with complaint of R hip pain. Patient states she was getting a massage a few weeks ago and when she got off the table she couldn't stand on her right foot. Whole right side is flared up. It is worse when she lays on her back. Leaning to the right, reaching out to close the car door is also painful. Some times the pain decreases throughout the day. It can start at a 9 in the morning and end up at a 6 out of 10 on the pain scale. Patient is very active but the pain has stopped her from doing all her normal activities. Taking pain medication similar to hydrocodone. Takes it at night. Anti-inflammatories upset her stomach. Normally does not take pain medications. Tried stretching, lies on the left side with pillow in between legs. Pain does wake the patient up at night and when it does she can not go back to sleep. Taking a sleeping pill so that she can sleep but if she has pain the sleeping medication does not work. Also taking muscle relaxer. Prednisone  pack for a week. The pain got worse during that week.      Patient did have an MRI of the hip in 2022 of the right hip that was unremarkable.  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  Past Medical History:  Diagnosis Date   Allergy    Anxiety    Back pain    Cyst of left breast    GERD (gastroesophageal reflux disease)    History of endometriosis    History of ovarian cyst    PTSD (post-traumatic stress disorder)    Tinnitus    Past Surgical History:  Procedure Laterality Date   BIOPSY  03/27/2023   Procedure: BIOPSY;   Surgeon: Unk Corinn Skiff, MD;  Location: [facility] ENDOSCOPY;  Service: Gastroenterology;;   COLONOSCOPY WITH PROPOFOL  N/A 03/21/2021   Procedure: COLONOSCOPY WITH PROPOFOL ;  Surgeon: Janalyn Keene NOVAK, MD;  Location: [facility] ENDOSCOPY;  Service: Endoscopy;  Laterality: N/A;   COLONOSCOPY WITH PROPOFOL  N/A 05/08/2022   Procedure: COLONOSCOPY WITH PROPOFOL ;  Surgeon: Unk Corinn Skiff, MD;  Location: [facility] ENDOSCOPY;  Service: Gastroenterology;  Laterality: N/A;   ESOPHAGOGASTRODUODENOSCOPY (EGD) WITH PROPOFOL  N/A 03/27/2023   Procedure: ESOPHAGOGASTRODUODENOSCOPY (EGD) WITH PROPOFOL ;  Surgeon: Unk Corinn Skiff, MD;  Location: [facility] ENDOSCOPY;  Service: Gastroenterology;  Laterality: N/A;   LAPAROSCOPIC VAGINAL HYSTERECTOMY     SPINAL FUSION  2018   SPINE SURGERY     Social History   Socioeconomic History   Marital status: Married    Spouse name: Not on file   Number of children: Not on file   Years of education: Not on file   Highest education level: Master's degree (e.g., MA, MS, MEng, MEd, MSW, MBA)  Occupational History   Not on file  Tobacco Use   Smoking status: Never   Smokeless tobacco: Never  Vaping Use   Vaping status: Never Used  Substance and Sexual Activity   Alcohol use: Yes    Alcohol/week: 1.0 standard drink of alcohol  Types: 1 Glasses of wine per week    Comment: glass of wine once a week   Drug use: Never   Sexual activity: Yes    Birth control/protection: Surgical    Comment: Hysterectomy  Other Topics Concern   Not on file  Social History Narrative   Not on file   Social Drivers of Health   Financial Resource Strain: Low Risk  (01/13/2023)   Overall Financial Resource Strain (CARDIA)    Difficulty of Paying Living Expenses: Not hard at all  Food Insecurity: No Food Insecurity (01/13/2023)   Hunger Vital Sign    Worried About Running Out of Food in the Last Year: Never true    Ran Out of Food in the Last Year: Never true  Transportation Needs: No  Transportation Needs (01/13/2023)   PRAPARE - Administrator, Civil Service (Medical): No    Lack of Transportation (Non-Medical): No  Physical Activity: Insufficiently Active (01/13/2023)   Exercise Vital Sign    Days of Exercise per Week: 3 days    Minutes of Exercise per Session: 30 min  Stress: No Stress Concern Present (01/13/2023)   [employer] of Occupational Health - Occupational Stress Questionnaire    Feeling of Stress : Not at all  Social Connections: Socially Integrated (01/13/2023)   Social Connection and Isolation Panel    Frequency of Communication with Friends and Family: More than three times a week    Frequency of Social Gatherings with Friends and Family: Once a week    Attends Religious Services: More than 4 times per year    Active Member of [employer] or Organizations: Yes    Attends Engineer, structural: More than 4 times per year    Marital Status: Married   Allergies  Allergen Reactions   Meperidine Swelling and Nausea Only    Blood Pressure Dropped Low BP and pass out.    Hydrocodone Nausea And Vomiting, Swelling and Nausea Only    Hot and cold chills, vomiting, headache, and chest pain  Other reaction(s): Vomiting Hot and cold chills, headache, chest pain   Hydromorphone Nausea And Vomiting and Nausea Only    Other reaction(s): Vomiting   Opium Other (See Comments)    Hot and cold chills, vomiting, headache and chest pain   Oxycodone Nausea And Vomiting and Nausea Only    Other reaction(s): Other (comments) Hot and cold chills, headache, vomiting, and chest pain.    Family History  Problem Relation Age of Onset   Autoimmune disease Mother    Heart attack Father 26   Cancer - Colon Maternal Grandmother    Alzheimer's disease Maternal Grandmother    Heart attack Maternal Grandfather    Bone cancer Paternal Grandmother     Current Outpatient Medications (Endocrine & Metabolic):    Budesonide  (EOHILIA ) 2 MG/10ML SUSP, TAKE 2  MG BY MOUTH 2 (TWO) TIMES DAILY.   estradiol  (VIVELLE -DOT) 0.05 MG/24HR patch, Place 1 patch (0.05 mg total) onto the skin 2 (two) times a week.  Current Facility-Administered Medications (Endocrine & Metabolic):    betamethasone  acetate-betamethasone  sodium phosphate (CELESTONE ) injection 3 mg    Current Outpatient Medications (Respiratory):    loratadine (CLARITIN) 10 MG tablet, Take 10 mg by mouth daily as needed.   Current Outpatient Medications (Analgesics):    celecoxib  (CELEBREX ) 100 MG capsule, TAKE 1 CAPSULE BY MOUTH TWICE A DAY   SUMAtriptan  (IMITREX ) 100 MG tablet, TAKE 1 TABLET FOR MIGRAINE AS NEEDED. MAY REPEAT IN 2  HOURS IF HEADACHE PERSISTS. MAX 2 TABS PER DAY     Current Outpatient Medications (Other):    ALPRAZolam  (XANAX ) 0.5 MG tablet, Take 0.5 mg by mouth 3 (three) times daily as needed for anxiety.   ascorbic acid (VITAMIN C) 500 MG tablet, Take 500 mg by mouth daily.   Cholecalciferol (VITAMIN D-1000 MAX ST) 25 MCG (1000 UT) tablet, Take 1,000 Units by mouth daily.   escitalopram (LEXAPRO) 10 MG tablet, Take 10 mg by mouth daily.   estradiol  (ESTRACE  VAGINAL) 0.1 MG/GM vaginal cream, Insert 1 g vaginally nightly for 1 week, then once wkly as maintenance   eszopiclone  (LUNESTA ) 2 MG TABS tablet, Take 2 mg by mouth at bedtime as needed.   Eszopiclone  3 MG TABS, TAKE 1 TABLET BY MOUTH AT BEDTIME TAKE IMMEDIATELY BEFORE BEDTIME   famotidine (PEPCID) 20 MG tablet, Take 20 mg by mouth at bedtime.   methocarbamol  (ROBAXIN ) 500 MG tablet, Take 1 tablet (500 mg total) by mouth every 8 (eight) hours as needed.   Multiple Vitamin (MULTI-VITAMIN) tablet, Take 1 tablet by mouth daily.   pantoprazole  (PROTONIX ) 40 MG tablet, TAKE 1 TABLET BY MOUTH EVERY DAY   Vitamin E (VITAMIN E/D-ALPHA NATURAL) 268 MG (400 UNIT) CAPS, Take by mouth.    Reviewed prior external information including notes and imaging from  primary care provider As well as notes that were available from  care everywhere and other healthcare systems.  Past medical history, social, surgical and family history all reviewed in electronic medical record.  No pertanent information unless stated regarding to the chief complaint.   Review of Systems:  No headache, visual changes, nausea, vomiting, diarrhea, constipation, dizziness, abdominal pain, skin rash, fevers, chills, night sweats, weight loss, swollen lymph nodes, body aches, joint swelling, chest pain, shortness of breath, mood changes. POSITIVE muscle aches  Objective  Blood pressure 100/60, pulse 80, height 5' 8 (1.727 m), weight 164 lb (74.4 kg), SpO2 93%.   General: No apparent distress alert and oriented x3 mood and affect normal, dressed appropriately.  HEENT: Pupils equal, extraocular movements intact  Respiratory: Patient's speak in full sentences and does not appear short of breath  Cardiovascular: No lower extremity edema, non tender, no erythema  Hip exam shows mild tenderness to palpation over the greater trochanteric area.  Good range of motion though noted with some mild tightness with FABER test.  Tightness with straight leg test but more of the hamstring than true radicular symptoms.  Back exam shows patient does have some mild loss lordosis.  Mild scoliosis noted.    Impression and Recommendations:    The above documentation has been reviewed and is accurate and complete Tamsin Nader M Jacqulene Huntley, DO

## 2024-02-18 ENCOUNTER — Ambulatory Visit (INDEPENDENT_AMBULATORY_CARE_PROVIDER_SITE_OTHER): Payer: Self-pay | Admitting: Family Medicine

## 2024-02-18 VITALS — BP 100/60 | HR 80 | Ht 68.0 in | Wt 164.0 lb

## 2024-02-18 DIAGNOSIS — M7061 Trochanteric bursitis, right hip: Secondary | ICD-10-CM

## 2024-02-18 DIAGNOSIS — M7062 Trochanteric bursitis, left hip: Secondary | ICD-10-CM

## 2024-02-18 DIAGNOSIS — M5126 Other intervertebral disc displacement, lumbar region: Secondary | ICD-10-CM

## 2024-02-18 NOTE — Assessment & Plan Note (Signed)
 Has had bursitis in the hips bilaterally previously.  Patient knows that what this feels like and feels that this is a different.  Concerned that that is more of a radicular symptoms of the back.  Would consider the L3-L4 injection.  Do think it could be significantly beneficial with an epidural in that area with an adjacent segment disease.  Do see this with patient's most recent imaging.  Follow-up with me again 6 weeks after the injection otherwise.

## 2024-02-18 NOTE — Patient Instructions (Addendum)
 Good to see you. Recommend L3-L4 injection See me again when you are feeling better.

## 2024-02-18 NOTE — Assessment & Plan Note (Signed)
 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  Patient will try the epidural and see how she responds.  Follow-up with me again otherwise 6 weeks after the injection to see how she is doing

## 2024-02-20 ENCOUNTER — Other Ambulatory Visit: Payer: Self-pay | Admitting: Family Medicine

## 2024-02-20 DIAGNOSIS — M5416 Radiculopathy, lumbar region: Secondary | ICD-10-CM

## 2024-02-21 ENCOUNTER — Ambulatory Visit
Admission: RE | Admit: 2024-02-21 | Discharge: 2024-02-21 | Disposition: A | Payer: Self-pay | Source: Ambulatory Visit | Attending: Family Medicine | Admitting: Family Medicine

## 2024-02-21 DIAGNOSIS — M5416 Radiculopathy, lumbar region: Secondary | ICD-10-CM

## 2024-03-25 ENCOUNTER — Other Ambulatory Visit: Payer: Self-pay | Admitting: Neurosurgery

## 2024-03-25 ENCOUNTER — Other Ambulatory Visit: Payer: Self-pay | Admitting: Obstetrics and Gynecology

## 2024-03-25 DIAGNOSIS — Z79890 Hormone replacement therapy (postmenopausal): Secondary | ICD-10-CM

## 2024-03-25 DIAGNOSIS — N951 Menopausal and female climacteric states: Secondary | ICD-10-CM

## 2024-04-07 NOTE — Progress Notes (Signed)
 Surgical Instructions   Your procedure is scheduled on Wednesday April 14, 2024. Report to [facility] Main Entrance A at 6:30 A.M., then check in with the Admitting office. Any questions or running late day of surgery: call [phone]  Questions prior to your surgery date: call [phone], Monday-Friday, 8am-4pm. If you experience any cold or flu symptoms such as cough, fever, chills, shortness of breath, etc. between now and your scheduled surgery, please notify us  at the above number.     Remember:  Do not eat or drink after midnight the night before your surgery  Take these medicines the morning of surgery with A SIP OF WATER  escitalopram (LEXAPRO)  ezetimibe (ZETIA)  pantoprazole  (PROTONIX )   May take these medicines IF NEEDED: ALPRAZolam  (XANAX )  famotidine (PEPCID)  loratadine (CLARITIN)  oxyCODONE-acetaminophen (PERCOCET/ROXICET)  SUMAtriptan  (IMITREX )   One week prior to surgery, STOP taking any Aspirin (unless otherwise instructed by your surgeon) Aleve, Naproxen, Ibuprofen, Motrin, Advil, Goody's, BC's, all herbal medications, fish oil, and non-prescription vitamins. This includes your diclofenac (VOLTAREN)                      Do NOT Smoke (Tobacco/Vaping) for 24 hours prior to your procedure.  If you use a CPAP at night, you may bring your mask/headgear for your overnight stay.   You will be asked to remove any contacts, glasses, piercing's, hearing aid's, dentures/partials prior to surgery. Please bring cases for these items if needed.    Patients discharged the day of surgery will not be allowed to drive home, and someone needs to stay with them for 24 hours.  SURGICAL WAITING ROOM VISITATION Patients may have no more than 2 support people in the waiting area - these visitors may rotate.   Pre-op nurse will coordinate an appropriate time for 1 ADULT support person, who may not rotate, to accompany patient in pre-op.  Children under the age of 43 must  have an adult with them who is not the patient and must remain in the main waiting area with an adult.  If the patient needs to stay at the hospital during part of their recovery, the visitor guidelines for inpatient rooms apply.  Please refer to the [facility] website for the visitor guidelines for any additional information.   If you received a COVID test during your pre-op visit  it is requested that you wear a mask when out in public, stay away from anyone that may not be feeling well and notify your surgeon if you develop symptoms. If you have been in contact with anyone that has tested positive in the last 10 days please notify you surgeon.      Pre-operative 4 CHG Bathing Instructions   You can play a key role in reducing the risk of infection after surgery. Your skin needs to be as free of germs as possible. You can reduce the number of germs on your skin by washing with CHG (chlorhexidine gluconate) soap before surgery. CHG is an antiseptic soap that kills germs and continues to kill germs even after washing.   DO NOT use if you have an allergy to chlorhexidine/CHG or antibacterial soaps. If your skin becomes reddened or irritated, stop using the CHG and notify one of our RNs at [phone].   Please shower with the CHG soap starting 4 days before surgery using the following schedule:     Please keep in mind the following:  DO NOT shave, including legs and underarms,  starting the day of your first shower.   Place clean sheets on your bed the day you start using CHG soap. Use a clean washcloth (not used since being washed) for each shower. DO NOT sleep with pets once you start using the CHG.   CHG Shower Instructions:  Wash your face and private area with normal soap. If you choose to wash your hair, wash first with your normal shampoo.  After you use shampoo/soap, rinse your hair and body thoroughly to remove shampoo/soap residue.  Turn the water OFF and apply  bottle of  CHG soap to a CLEAN washcloth.  Apply CHG soap ONLY FROM YOUR NECK DOWN TO YOUR TOES (washing for 3-5 minutes)  DO NOT use CHG soap on face, private areas, open wounds, or sores.  Pay special attention to the area where your surgery is being performed.  If you are having back surgery, having someone wash your back for you may be helpful. Wait 2 minutes after CHG soap is applied, then you may rinse off the CHG soap.  Pat dry with a clean towel  Put on clean clothes/pajamas   If you choose to wear lotion, please use ONLY the CHG-compatible lotions that are listed below.  Additional instructions for the day of surgery:  If you choose, you may shower the morning of surgery with an antibacterial soap.  DO NOT APPLY any lotions, deodorants or perfumes.   Do not bring valuables to the hospital. [facility] is not responsible for any belongings/valuables. Do not wear nail polish, gel polish, artificial nails, or any other type of covering on natural nails (fingers and toes) Do not wear jewelry or makeup Put on clean/comfortable clothes.  Please brush your teeth.  Ask your nurse before applying any prescription medications to the skin.     CHG Compatible Lotions   Aveeno Moisturizing lotion  Cetaphil Moisturizing Cream  Cetaphil Moisturizing Lotion  Clairol Herbal Essence Moisturizing Lotion, Dry Skin  Clairol Herbal Essence Moisturizing Lotion, Extra Dry Skin  Clairol Herbal Essence Moisturizing Lotion, Normal Skin  Curel Age Defying Therapeutic Moisturizing Lotion with Alpha Hydroxy  Curel Extreme Care Body Lotion  Curel Soothing Hands Moisturizing Hand Lotion  Curel Therapeutic Moisturizing Cream, Fragrance-Free  Curel Therapeutic Moisturizing Lotion, Fragrance-Free  Curel Therapeutic Moisturizing Lotion, Original Formula  Eucerin Daily Replenishing Lotion  Eucerin Dry Skin Therapy Plus Alpha Hydroxy Crme  Eucerin Dry Skin Therapy Plus Alpha Hydroxy Lotion  Eucerin Original  Crme  Eucerin Original Lotion  Eucerin Plus Crme Eucerin Plus Lotion  Eucerin TriLipid Replenishing Lotion  Keri Anti-Bacterial Hand Lotion  Keri Deep Conditioning Original Lotion Dry Skin Formula Softly Scented  Keri Deep Conditioning Original Lotion, Fragrance Free Sensitive Skin Formula  Keri Lotion Fast Absorbing Fragrance Free Sensitive Skin Formula  Keri Lotion Fast Absorbing Softly Scented Dry Skin Formula  Keri Original Lotion  Keri Skin Renewal Lotion Keri Silky Smooth Lotion  Keri Silky Smooth Sensitive Skin Lotion  Nivea Body Creamy Conditioning Oil  Nivea Body Extra Enriched Lotion  Nivea Body Original Lotion  Nivea Body Sheer Moisturizing Lotion Nivea Crme  Nivea Skin Firming Lotion  NutraDerm 30 Skin Lotion  NutraDerm Skin Lotion  NutraDerm Therapeutic Skin Cream  NutraDerm Therapeutic Skin Lotion  ProShield Protective Hand Cream  Provon moisturizing lotion  Please read over the following fact sheets that you were given.

## 2024-04-08 ENCOUNTER — Encounter (HOSPITAL_COMMUNITY)
Admission: RE | Admit: 2024-04-08 | Discharge: 2024-04-08 | Disposition: A | Discharge status: Home / Self Care | Source: Ambulatory Visit | Attending: Neurosurgery | Admitting: Neurosurgery

## 2024-04-08 ENCOUNTER — Encounter (HOSPITAL_COMMUNITY): Payer: Self-pay

## 2024-04-08 ENCOUNTER — Other Ambulatory Visit: Payer: Self-pay

## 2024-04-08 VITALS — BP 111/69 | HR 80 | Temp 98.1°F | Resp 16 | Ht 68.0 in | Wt 162.7 lb

## 2024-04-08 DIAGNOSIS — Z01812 Encounter for preprocedural laboratory examination: Secondary | ICD-10-CM | POA: Insufficient documentation

## 2024-04-08 DIAGNOSIS — Z01818 Encounter for other preprocedural examination: Secondary | ICD-10-CM

## 2024-04-08 HISTORY — DX: Headache, unspecified: R51.9

## 2024-04-08 HISTORY — DX: Unspecified osteoarthritis, unspecified site: M19.90

## 2024-04-08 LAB — CBC
HCT: 42.3 % (ref 36.0–46.0)
Hemoglobin: 13.8 g/dL (ref 12.0–15.0)
MCH: 30.3 pg (ref 26.0–34.0)
MCHC: 32.6 g/dL (ref 30.0–36.0)
MCV: 93 fL (ref 80.0–100.0)
Platelets: 228 K/uL (ref 150–400)
RBC: 4.55 MIL/uL (ref 3.87–5.11)
RDW: 12.8 % (ref 11.5–15.5)
WBC: 4.6 K/uL (ref 4.0–10.5)
nRBC: 0 % (ref 0.0–0.2)

## 2024-04-08 LAB — TYPE AND SCREEN
ABO/RH(D): B POS
Antibody Screen: NEGATIVE

## 2024-04-08 LAB — SURGICAL PCR SCREEN
MRSA, PCR: NEGATIVE
Staphylococcus aureus: NEGATIVE

## 2024-04-08 NOTE — Progress Notes (Signed)
 PCP - Dorn Pouch at [facility] in [place] Cardiologist - denies  PPM/ICD - denies   Chest x-ray - denies EKG - requested from PCP Stress Test - 08/12/2019 ECHO - denies Cardiac Cath - denies  Sleep Study - denies CPAP - n/a  No DM  Last dose of GLP1 agonist-  n/a GLP1 instructions:  n/a  Blood Thinner Instructions: n/a Aspirin Instructions: n/a  ERAS Protcol - NPO PRE-SURGERY Ensure or G2-  n/a  COVID TEST- n/a   Anesthesia review:  no.   Patient denies shortness of breath, fever, cough and chest pain at PAT appointment   All instructions explained to the patient, with a verbal understanding of the material. Patient agrees to go over the instructions while at home for a better understanding. Patient also instructed to self quarantine after being tested for COVID-19. The opportunity to ask questions was provided.

## 2024-04-13 NOTE — Anesthesia Preprocedure Evaluation (Signed)
 Anesthesia Evaluation  Patient identified by MRN, date of birth, ID band Patient awake    Reviewed: Allergy & Precautions, NPO status , Patient's Chart, lab work & pertinent test results  Airway Mallampati: III  TM Distance: >3 FB Neck ROM: Full    Dental  (+) Teeth Intact, Dental Advisory Given   Pulmonary neg pulmonary ROS   Pulmonary exam normal breath sounds clear to auscultation       Cardiovascular negative cardio ROS Normal cardiovascular exam Rhythm:Regular Rate:Normal     Neuro/Psych  Headaches PSYCHIATRIC DISORDERS Anxiety     Lumbar stenosis with neurogenic claudication  Neuromuscular disease    GI/Hepatic Neg liver ROS,GERD  Medicated,,  Endo/Other  negative endocrine ROS    Renal/GU negative Renal ROS     Musculoskeletal  (+) Arthritis ,    Abdominal   Peds  Hematology negative hematology ROS (+)   Anesthesia Other Findings   Reproductive/Obstetrics                              Anesthesia Physical Anesthesia Plan  ASA: 2  Anesthesia Plan: General   Post-op Pain Management: Tylenol PO (pre-op)*   Induction: Intravenous  PONV Risk Score and Plan: 3 and Midazolam , Dexamethasone  and Ondansetron  Airway Management Planned: Oral ETT  Additional Equipment:   Intra-op Plan:   Post-operative Plan: Extubation in OR  Informed Consent: I have reviewed the patients History and Physical, chart, labs and discussed the procedure including the risks, benefits and alternatives for the proposed anesthesia with the patient or authorized representative who has indicated his/her understanding and acceptance.     Dental advisory given  Plan Discussed with: CRNA  Anesthesia Plan Comments:          Anesthesia Quick Evaluation

## 2024-04-14 ENCOUNTER — Observation Stay (HOSPITAL_COMMUNITY)
Admission: RE | Admit: 2024-04-14 | Discharge: 2024-04-15 | Disposition: A | Discharge status: Home / Self Care | Payer: Self-pay | Attending: Neurosurgery | Admitting: Neurosurgery

## 2024-04-14 ENCOUNTER — Ambulatory Visit (HOSPITAL_COMMUNITY): Payer: Self-pay

## 2024-04-14 ENCOUNTER — Encounter (HOSPITAL_COMMUNITY): Payer: Self-pay | Admitting: Neurosurgery

## 2024-04-14 ENCOUNTER — Other Ambulatory Visit: Payer: Self-pay

## 2024-04-14 ENCOUNTER — Ambulatory Visit (HOSPITAL_COMMUNITY)
Admission: RE | Disposition: A | Discharge status: Home / Self Care | Payer: Self-pay | Source: Home / Self Care | Attending: Neurosurgery

## 2024-04-14 ENCOUNTER — Ambulatory Visit (HOSPITAL_COMMUNITY)

## 2024-04-14 DIAGNOSIS — M48062 Spinal stenosis, lumbar region with neurogenic claudication: Secondary | ICD-10-CM | POA: Diagnosis not present

## 2024-04-14 DIAGNOSIS — M549 Dorsalgia, unspecified: Secondary | ICD-10-CM | POA: Diagnosis present

## 2024-04-14 DIAGNOSIS — M5416 Radiculopathy, lumbar region: Secondary | ICD-10-CM | POA: Diagnosis not present

## 2024-04-14 LAB — ABO/RH: ABO/RH(D): B POS

## 2024-04-14 SURGERY — POSTERIOR LUMBAR FUSION 1 LEVEL
Anesthesia: General

## 2024-04-14 MED ORDER — PANTOPRAZOLE SODIUM 40 MG PO TBEC
40.0000 mg | DELAYED_RELEASE_TABLET | Freq: Every day | ORAL | Status: DC
  Administered 2024-04-15: 40 mg via ORAL
  Filled 2024-04-14: qty 1

## 2024-04-14 MED ORDER — LIDOCAINE 2% (20 MG/ML) 5 ML SYRINGE
INTRAMUSCULAR | Status: DC | PRN
  Administered 2024-04-14: 80 mg via INTRAVENOUS

## 2024-04-14 MED ORDER — SENNA 8.6 MG PO TABS: 1.0000 | ORAL_TABLET | Freq: Two times a day (BID) | ORAL | Status: DC | PRN

## 2024-04-14 MED ORDER — CYCLOBENZAPRINE HCL 10 MG PO TABS
10.0000 mg | ORAL_TABLET | Freq: Three times a day (TID) | ORAL | Status: DC | PRN
  Filled 2024-04-14: qty 1

## 2024-04-14 MED ORDER — MIDAZOLAM HCL 2 MG/2ML IJ SOLN
INTRAMUSCULAR | Status: AC
Start: 2024-04-14 — End: 2024-04-14
  Filled 2024-04-14: qty 2

## 2024-04-14 MED ORDER — FENTANYL CITRATE (PF) 100 MCG/2ML IJ SOLN
INTRAMUSCULAR | Status: AC
  Filled 2024-04-14: qty 2

## 2024-04-14 MED ORDER — ZOLPIDEM TARTRATE 5 MG PO TABS: 5.0000 mg | ORAL_TABLET | Freq: Every evening | ORAL | Status: DC | PRN

## 2024-04-14 MED ORDER — ONDANSETRON HCL 4 MG PO TABS: 4.0000 mg | ORAL_TABLET | Freq: Four times a day (QID) | ORAL | Status: DC | PRN

## 2024-04-14 MED ORDER — ACETAMINOPHEN 10 MG/ML IV SOLN
INTRAVENOUS | Status: AC
  Filled 2024-04-14: qty 100

## 2024-04-14 MED ORDER — SODIUM CHLORIDE 0.9 % IV SOLN
250.0000 mL | INTRAVENOUS | Status: DC
  Administered 2024-04-14: 250 mL via INTRAVENOUS

## 2024-04-14 MED ORDER — PROPOFOL 10 MG/ML IV BOLUS
INTRAVENOUS | Status: DC | PRN
  Administered 2024-04-14: 130 mg via INTRAVENOUS

## 2024-04-14 MED ORDER — MENTHOL 3 MG MT LOZG: 1.0000 | LOZENGE | OROMUCOSAL | Status: DC | PRN

## 2024-04-14 MED ORDER — PROPOFOL 10 MG/ML IV BOLUS
INTRAVENOUS | Status: AC
Start: 2024-04-14 — End: 2024-04-14
  Filled 2024-04-14: qty 40

## 2024-04-14 MED ORDER — ACETAMINOPHEN 500 MG PO TABS
1000.0000 mg | ORAL_TABLET | Freq: Four times a day (QID) | ORAL | Status: DC
  Administered 2024-04-15 (×3): 1000 mg via ORAL
  Filled 2024-04-14 (×4): qty 2

## 2024-04-14 MED ORDER — FENTANYL CITRATE (PF) 250 MCG/5ML IJ SOLN
INTRAMUSCULAR | Status: AC
  Filled 2024-04-14: qty 5

## 2024-04-14 MED ORDER — ESCITALOPRAM OXALATE 10 MG PO TABS
10.0000 mg | ORAL_TABLET | Freq: Every day | ORAL | Status: DC
  Administered 2024-04-14: 10 mg via ORAL
  Filled 2024-04-14: qty 1

## 2024-04-14 MED ORDER — PHENYLEPHRINE 80 MCG/ML (10ML) SYRINGE FOR IV PUSH (FOR BLOOD PRESSURE SUPPORT)
PREFILLED_SYRINGE | INTRAVENOUS | Status: DC | PRN
  Administered 2024-04-14 (×2): 80 ug via INTRAVENOUS
  Administered 2024-04-14: 160 ug via INTRAVENOUS

## 2024-04-14 MED ORDER — VASHE WOUND IRRIGATION OPTIME
TOPICAL | Status: DC | PRN
  Administered 2024-04-14: 34 [oz_av] via TOPICAL

## 2024-04-14 MED ORDER — DOCUSATE SODIUM 100 MG PO CAPS
100.0000 mg | ORAL_CAPSULE | Freq: Two times a day (BID) | ORAL | Status: DC
  Administered 2024-04-14 – 2024-04-15 (×2): 100 mg via ORAL
  Filled 2024-04-14 (×2): qty 1

## 2024-04-14 MED ORDER — HYDROMORPHONE HCL 2 MG PO TABS
2.0000 mg | ORAL_TABLET | ORAL | Status: DC | PRN
  Administered 2024-04-14 – 2024-04-15 (×6): 2 mg via ORAL
  Filled 2024-04-14 (×7): qty 1

## 2024-04-14 MED ORDER — ACETAMINOPHEN 10 MG/ML IV SOLN
1000.0000 mg | Freq: Once | INTRAVENOUS | Status: AC
  Administered 2024-04-14: 1000 mg via INTRAVENOUS

## 2024-04-14 MED ORDER — CHLORHEXIDINE GLUCONATE CLOTH 2 % EX PADS: 6.0000 | MEDICATED_PAD | Freq: Once | CUTANEOUS | Status: DC

## 2024-04-14 MED ORDER — PHENYLEPHRINE 80 MCG/ML (10ML) SYRINGE FOR IV PUSH (FOR BLOOD PRESSURE SUPPORT)
PREFILLED_SYRINGE | INTRAVENOUS | Status: AC
Start: 2024-04-14 — End: 2024-04-14
  Filled 2024-04-14: qty 10

## 2024-04-14 MED ORDER — FENTANYL CITRATE (PF) 100 MCG/2ML IJ SOLN
25.0000 ug | INTRAMUSCULAR | Status: DC | PRN
  Administered 2024-04-14 (×4): 25 ug via INTRAVENOUS
  Administered 2024-04-14: 50 ug via INTRAVENOUS

## 2024-04-14 MED ORDER — EZETIMIBE 10 MG PO TABS
10.0000 mg | ORAL_TABLET | Freq: Every day | ORAL | Status: DC
  Administered 2024-04-14: 10 mg via ORAL
  Filled 2024-04-14: qty 1

## 2024-04-14 MED ORDER — ROCURONIUM BROMIDE 10 MG/ML (PF) SYRINGE
PREFILLED_SYRINGE | INTRAVENOUS | Status: DC | PRN
  Administered 2024-04-14: 10 mg via INTRAVENOUS
  Administered 2024-04-14: 20 mg via INTRAVENOUS
  Administered 2024-04-14 (×2): 5 mg via INTRAVENOUS
  Administered 2024-04-14: 15 mg via INTRAVENOUS
  Administered 2024-04-14 (×2): 10 mg via INTRAVENOUS
  Administered 2024-04-14: 60 mg via INTRAVENOUS

## 2024-04-14 MED ORDER — ALPRAZOLAM 0.5 MG PO TABS
0.5000 mg | ORAL_TABLET | Freq: Three times a day (TID) | ORAL | Status: DC | PRN
  Administered 2024-04-14: 0.5 mg via ORAL
  Filled 2024-04-14 (×2): qty 1

## 2024-04-14 MED ORDER — BUPIVACAINE-EPINEPHRINE (PF) 0.5% -1:200000 IJ SOLN
INTRAMUSCULAR | Status: AC
  Filled 2024-04-14: qty 30

## 2024-04-14 MED ORDER — DEXAMETHASONE SOD PHOSPHATE PF 10 MG/ML IJ SOLN
INTRAMUSCULAR | Status: DC | PRN
  Administered 2024-04-14: 10 mg via INTRAVENOUS

## 2024-04-14 MED ORDER — BACITRACIN ZINC 500 UNIT/GM EX OINT
TOPICAL_OINTMENT | CUTANEOUS | Status: AC
  Filled 2024-04-14: qty 28.35

## 2024-04-14 MED ORDER — LIDOCAINE 2% (20 MG/ML) 5 ML SYRINGE
INTRAMUSCULAR | Status: AC
  Filled 2024-04-14: qty 5

## 2024-04-14 MED ORDER — BISACODYL 10 MG RE SUPP: 10.0000 mg | Freq: Every day | RECTAL | Status: DC | PRN

## 2024-04-14 MED ORDER — SODIUM CHLORIDE 0.9% FLUSH
3.0000 mL | INTRAVENOUS | Status: DC | PRN
Start: 2024-04-14 — End: 2024-04-15

## 2024-04-14 MED ORDER — EPHEDRINE SULFATE-NACL 50-0.9 MG/10ML-% IV SOSY
PREFILLED_SYRINGE | INTRAVENOUS | Status: DC | PRN
Start: 2024-04-14 — End: 2024-04-14
  Administered 2024-04-14 (×2): 10 mg via INTRAVENOUS

## 2024-04-14 MED ORDER — ROCURONIUM BROMIDE 10 MG/ML (PF) SYRINGE
PREFILLED_SYRINGE | INTRAVENOUS | Status: AC
Start: 2024-04-14 — End: 2024-04-14
  Filled 2024-04-14: qty 10

## 2024-04-14 MED ORDER — ONDANSETRON HCL 4 MG/2ML IJ SOLN: 4.0000 mg | Freq: Four times a day (QID) | INTRAMUSCULAR | Status: DC | PRN

## 2024-04-14 MED ORDER — CEFAZOLIN SODIUM-DEXTROSE 2-4 GM/100ML-% IV SOLN
2.0000 g | INTRAVENOUS | Status: AC
  Administered 2024-04-14: 2 g via INTRAVENOUS
  Filled 2024-04-14: qty 100

## 2024-04-14 MED ORDER — LACTATED RINGERS IV SOLN: INTRAVENOUS | Status: DC

## 2024-04-14 MED ORDER — CHLORHEXIDINE GLUCONATE 0.12 % MT SOLN
15.0000 mL | Freq: Once | OROMUCOSAL | Status: AC
  Administered 2024-04-14: 15 mL via OROMUCOSAL
  Filled 2024-04-14: qty 15

## 2024-04-14 MED ORDER — SUGAMMADEX SODIUM 200 MG/2ML IV SOLN
INTRAVENOUS | Status: DC | PRN
  Administered 2024-04-14 (×2): 100 mg via INTRAVENOUS

## 2024-04-14 MED ORDER — POTASSIUM 99 MG PO TABS: 1.0000 | ORAL_TABLET | Freq: Every day | ORAL | Status: DC

## 2024-04-14 MED ORDER — ACETAMINOPHEN 650 MG RE SUPP: 650.0000 mg | RECTAL | Status: DC | PRN

## 2024-04-14 MED ORDER — SODIUM CHLORIDE 0.9% FLUSH
3.0000 mL | Freq: Two times a day (BID) | INTRAVENOUS | Status: DC
  Administered 2024-04-14 (×2): 3 mL via INTRAVENOUS

## 2024-04-14 MED ORDER — METHOCARBAMOL 1000 MG/10ML IJ SOLN: 500.0000 mg | Freq: Four times a day (QID) | INTRAMUSCULAR | Status: DC | PRN

## 2024-04-14 MED ORDER — FENTANYL CITRATE (PF) 250 MCG/5ML IJ SOLN
INTRAMUSCULAR | Status: DC | PRN
  Administered 2024-04-14 (×2): 25 ug via INTRAVENOUS
  Administered 2024-04-14: 100 ug via INTRAVENOUS
  Administered 2024-04-14 (×2): 50 ug via INTRAVENOUS

## 2024-04-14 MED ORDER — THROMBIN 5000 UNITS EX KIT
PACK | CUTANEOUS | Status: AC
  Filled 2024-04-14: qty 1

## 2024-04-14 MED ORDER — METHOCARBAMOL 500 MG PO TABS
500.0000 mg | ORAL_TABLET | Freq: Four times a day (QID) | ORAL | Status: DC | PRN
  Administered 2024-04-14 – 2024-04-15 (×3): 500 mg via ORAL
  Filled 2024-04-14 (×3): qty 1

## 2024-04-14 MED ORDER — BUPIVACAINE-EPINEPHRINE (PF) 0.5% -1:200000 IJ SOLN
INTRAMUSCULAR | Status: DC | PRN
  Administered 2024-04-14: 10 mL via PERINEURAL

## 2024-04-14 MED ORDER — MIDAZOLAM HCL (PF) 2 MG/2ML IJ SOLN
INTRAMUSCULAR | Status: DC | PRN
  Administered 2024-04-14: 2 mg via INTRAVENOUS

## 2024-04-14 MED ORDER — BUPIVACAINE LIPOSOME 1.3 % IJ SUSP
INTRAMUSCULAR | Status: AC
  Filled 2024-04-14: qty 20

## 2024-04-14 MED ORDER — SUMATRIPTAN SUCCINATE 50 MG PO TABS
50.0000 mg | ORAL_TABLET | ORAL | Status: DC | PRN
Start: 2024-04-14 — End: 2024-04-15

## 2024-04-14 MED ORDER — BACITRACIN ZINC 500 UNIT/GM EX OINT
TOPICAL_OINTMENT | CUTANEOUS | Status: DC | PRN
  Administered 2024-04-14: 1 via TOPICAL

## 2024-04-14 MED ORDER — MORPHINE SULFATE (PF) 2 MG/ML IV SOLN: 2.0000 mg | INTRAVENOUS | Status: DC | PRN

## 2024-04-14 MED ORDER — LORATADINE 10 MG PO TABS: 10.0000 mg | ORAL_TABLET | Freq: Every day | ORAL | Status: DC | PRN

## 2024-04-14 MED ORDER — ONDANSETRON HCL 4 MG/2ML IJ SOLN
INTRAMUSCULAR | Status: AC
  Filled 2024-04-14: qty 2

## 2024-04-14 MED ORDER — THROMBIN 5000 UNITS EX SOLR
OROMUCOSAL | Status: DC | PRN
  Administered 2024-04-14 (×2): 5 mL via TOPICAL

## 2024-04-14 MED ORDER — ACETAMINOPHEN 325 MG PO TABS: 650.0000 mg | ORAL_TABLET | ORAL | Status: DC | PRN

## 2024-04-14 MED ORDER — ORAL CARE MOUTH RINSE: 15.0000 mL | Freq: Once | OROMUCOSAL | Status: AC

## 2024-04-14 MED ORDER — PHENOL 1.4 % MT LIQD
1.0000 | OROMUCOSAL | Status: DC | PRN
Start: 2024-04-14 — End: 2024-04-15

## 2024-04-14 MED ORDER — ONDANSETRON HCL 4 MG/2ML IJ SOLN
INTRAMUSCULAR | Status: DC | PRN
  Administered 2024-04-14: 4 mg via INTRAVENOUS

## 2024-04-14 MED ORDER — EPHEDRINE 5 MG/ML INJ
INTRAVENOUS | Status: AC
  Filled 2024-04-14: qty 5

## 2024-04-14 MED ORDER — FAMOTIDINE 20 MG PO TABS: 20.0000 mg | ORAL_TABLET | Freq: Every day | ORAL | Status: DC | PRN

## 2024-04-14 MED ORDER — ONDANSETRON HCL 4 MG/2ML IJ SOLN: 4.0000 mg | Freq: Once | INTRAMUSCULAR | Status: DC | PRN

## 2024-04-14 MED ORDER — AMISULPRIDE (ANTIEMETIC) 5 MG/2ML IV SOLN: 10.0000 mg | Freq: Once | INTRAVENOUS | Status: DC | PRN

## 2024-04-14 MED ORDER — BUPIVACAINE LIPOSOME 1.3 % IJ SUSP
INTRAMUSCULAR | Status: DC | PRN
Start: 2024-04-14 — End: 2024-04-14
  Administered 2024-04-14: 30 mL

## 2024-04-14 MED ORDER — ACETAMINOPHEN 500 MG PO TABS
1000.0000 mg | ORAL_TABLET | Freq: Once | ORAL | Status: AC
  Administered 2024-04-14: 1000 mg via ORAL
  Filled 2024-04-14: qty 2

## 2024-04-14 MED ORDER — 0.9 % SODIUM CHLORIDE (POUR BTL) OPTIME
TOPICAL | Status: DC | PRN
  Administered 2024-04-14: 1000 mL

## 2024-04-14 MED ORDER — CEFAZOLIN SODIUM-DEXTROSE 2-4 GM/100ML-% IV SOLN
2.0000 g | Freq: Three times a day (TID) | INTRAVENOUS | Status: AC
  Administered 2024-04-14 – 2024-04-15 (×2): 2 g via INTRAVENOUS
  Filled 2024-04-14 (×2): qty 100

## 2024-04-14 MED ORDER — PHENYLEPHRINE HCL-NACL 20-0.9 MG/250ML-% IV SOLN
INTRAVENOUS | Status: DC | PRN
  Administered 2024-04-14: 30 ug/min via INTRAVENOUS

## 2024-04-14 SURGICAL SUPPLY — 61 items
BAG COUNTER SPONGE SURGICOUNT (BAG) ×1 IMPLANT
BASKET BONE COLLECTION (BASKET) ×1 IMPLANT
BENZOIN TINCTURE PRP APPL 2/3 (GAUZE/BANDAGES/DRESSINGS) ×1 IMPLANT
BLADE CLIPPER SURG (BLADE) IMPLANT
BUR MATCHSTICK NEURO 3.0 LAGG (BURR) ×1 IMPLANT
BUR PRECISION FLUTE 6.0 (BURR) ×1 IMPLANT
CAGE ALTERA 10X31X9-13 15D (Cage) IMPLANT
CANISTER SUCTION 3000ML PPV (SUCTIONS) ×1 IMPLANT
CLEANSER WND VASHE INSTL 34OZ (WOUND CARE) ×1 IMPLANT
CLSR STERI-STRIP ANTIMIC 1/2X4 (GAUZE/BANDAGES/DRESSINGS) IMPLANT
CNTNR URN SCR LID CUP LEK RST (MISCELLANEOUS) ×1 IMPLANT
COVER BACK TABLE 60X90IN (DRAPES) ×1 IMPLANT
DERMABOND ADVANCED .7 DNX12 (GAUZE/BANDAGES/DRESSINGS) IMPLANT
DRAPE C-ARM 42X72 X-RAY (DRAPES) ×2 IMPLANT
DRAPE HALF SHEET 40X57 (DRAPES) ×1 IMPLANT
DRAPE LAPAROTOMY 100X72X124 (DRAPES) ×1 IMPLANT
DRAPE SURG 17X23 STRL (DRAPES) ×1 IMPLANT
DRSG OPSITE POSTOP 4X12 (GAUZE/BANDAGES/DRESSINGS) IMPLANT
DRSG OPSITE POSTOP 4X6 (GAUZE/BANDAGES/DRESSINGS) ×1 IMPLANT
ELECTRODE BLDE 4.0 EZ CLN MEGD (MISCELLANEOUS) ×1 IMPLANT
ELECTRODE REM PT RTRN 9FT ADLT (ELECTROSURGICAL) ×1 IMPLANT
EVACUATOR 1/8 PVC DRAIN (DRAIN) ×1 IMPLANT
GAUZE 4X4 16PLY ~~LOC~~+RFID DBL (SPONGE) ×1 IMPLANT
GLOVE BIO SURGEON STRL SZ 6 (GLOVE) ×1 IMPLANT
GLOVE BIO SURGEON STRL SZ8 (GLOVE) ×2 IMPLANT
GLOVE BIO SURGEON STRL SZ8.5 (GLOVE) ×2 IMPLANT
GLOVE BIOGEL PI IND STRL 6.5 (GLOVE) ×1 IMPLANT
GOWN STRL REUS W/ TWL LRG LVL3 (GOWN DISPOSABLE) ×1 IMPLANT
GOWN STRL REUS W/ TWL XL LVL3 (GOWN DISPOSABLE) ×2 IMPLANT
GOWN STRL REUS W/TWL 2XL LVL3 (GOWN DISPOSABLE) IMPLANT
HEMOSTAT POWDER KIT SURGIFOAM (HEMOSTASIS) ×1 IMPLANT
KIT BASIN OR (CUSTOM PROCEDURE TRAY) ×1 IMPLANT
KIT GRAFTMAG DEL NEURO DISP (NEUROSURGERY SUPPLIES) IMPLANT
KIT POSITIONER JACKSON TABLE (MISCELLANEOUS) ×1 IMPLANT
KIT TURNOVER KIT B (KITS) ×1 IMPLANT
NDL HYPO 21X1.5 SAFETY (NEEDLE) ×1 IMPLANT
NDL HYPO 22X1.5 SAFETY MO (MISCELLANEOUS) ×1 IMPLANT
NEEDLE HYPO 21X1.5 SAFETY (NEEDLE) ×1 IMPLANT
NEEDLE HYPO 22X1.5 SAFETY MO (MISCELLANEOUS) ×1 IMPLANT
PACK LAMINECTOMY NEURO (CUSTOM PROCEDURE TRAY) ×1 IMPLANT
PAD ARMBOARD POSITIONER FOAM (MISCELLANEOUS) ×3 IMPLANT
PATTIES SURGICAL .5 X1 (DISPOSABLE) IMPLANT
PUTTY DBM GRAFTON 5CC (Putty) IMPLANT
ROD EXPEDIUM PREBENT 5.5X50 (Rod) IMPLANT
ROD PRE BENT EXP 40MM (Rod) IMPLANT
SCREW CORT FIX FEN 5.5X7X55MM (Screw) IMPLANT
SCREW SET SINGLE INNER (Screw) IMPLANT
SOLN 0.9% NACL POUR BTL 1000ML (IV SOLUTION) ×1 IMPLANT
SOLN STERILE WATER BTL 1000 ML (IV SOLUTION) ×1 IMPLANT
SPIKE FLUID TRANSFER (MISCELLANEOUS) ×1 IMPLANT
SPONGE NEURO XRAY DETECT 1X3 (DISPOSABLE) IMPLANT
SPONGE SURGIFOAM ABS GEL 100 (HEMOSTASIS) IMPLANT
SPONGE T-LAP 4X18 ~~LOC~~+RFID (SPONGE) IMPLANT
STRIP CLOSURE SKIN 1/2X4 (GAUZE/BANDAGES/DRESSINGS) ×1 IMPLANT
SUT VIC AB 1 CT1 18XBRD ANBCTR (SUTURE) ×2 IMPLANT
SUT VIC AB 2-0 CP2 18 (SUTURE) ×2 IMPLANT
SYR 20ML LL LF (SYRINGE) IMPLANT
TAP EXPEDIUM DL 6.0 (INSTRUMENTS) IMPLANT
TOWEL GREEN STERILE (TOWEL DISPOSABLE) ×1 IMPLANT
TOWEL GREEN STERILE FF (TOWEL DISPOSABLE) ×1 IMPLANT
TRAY FOLEY MTR SLVR 16FR STAT (SET/KITS/TRAYS/PACK) ×1 IMPLANT

## 2024-04-14 NOTE — Op Note (Signed)
 Brief history: The patient is a 63 year old white female on whom another physician performed an L4-5 fusion.  She has developed recurrent back, buttock and leg pain consistent with neurogenic claudication.  She failed medical management.  She was worked up with a lumbar MRI and lumbar x-rays which demonstrated significant spinal stenosis at L3-4.  I discussed the various treatment options with her.  She has decided to proceed with surgery.  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  Procedure: Bilateral L3-4 laminotomy/foraminotomies/medial facetectomy to decompress the bilateral L3 and L4 nerve roots(the work required to do this was in addition to the work required to do the posterior lumbar interbody fusion because of the patient's spinal stenosis, facet arthropathy. Etc. requiring a wide decompression of the nerve roots.); right L3-4 transforaminal lumbar interbody fusion with local morselized autograft bone and Zimmer DBM; insertion of interbody prosthesis at L3-4 (globus peek expandable interbody prosthesis); posterior segmental instrumentation from L3 to 5 with DePuy titanium pedicle screws and rods; posterior lateral arthrodesis at 3 4 and redo L4-5 posterolateral arthrodesis with local morselized autograft bone and Medtronic DBM; exploration of lumbar fusion/removal of lumbar hardware.  Surgeon: Dr. Chyrl Budge  Asst.: Duwaine Beck, NP  Anesthesia: Gen. endotracheal  Estimated blood loss: 250 cc  Drains: Medium Hemovac drain in the epidural space  Complications: None  Description of procedure: The patient was brought to the operating room by the anesthesia team. General endotracheal anesthesia was induced. The patient was turned to the prone position on the Wilson frame. The patient's lumbosacral region was then  prepared with Betadine scrub and Betadine solution. Sterile drapes were applied.  I then injected the area to be incised with Marcaine  with epinephrine  solution. I then used the scalpel to make a linear midline incision over the L3-4 and L4-5 interspace, incising through the old surgical scar. I then used electrocautery to perform a bilateral subperiosteal dissection exposing the spinous process and lamina of L3-4 and L4-5 and exposing the old hardware at L4-5.  We then inserted the Verstrac retractor to provide exposure.  We explored the fusion by removing the caps from the old screws.  We then removed the rods.  The screw at L5 on the left was fractured just below the screw head.  That screw could not be removed/replaced without extensive bony drilling.  Possibly indicative of a pseudoarthrosis  I began the decompression by using the high speed drill to perform laminotomies at L3-4 bilaterally. We then used the Kerrison punches to widen the laminotomy and removed the ligamentum flavum at L3-4 bilaterally. We used the Kerrison punches to  the medial facets at L3-4 bilaterally. We performed wide foraminotomies about the bilateral L3 and L4 nerve roots completing the decompression.  We now turned our attention to the posterior lumbar interbody fusion. I used a scalpel to incise the intervertebral disc at L3-4 bilaterally. I then performed a partial intervertebral discectomy at L3-4 bilaterally using the pituitary forceps. We prepared the vertebral endplates at L3-4 bilaterally for the fusion by removing the soft tissues with the curettes. We then used the trial spacers to pick the appropriate sized interbody prosthesis. We prefilled his prosthesis with a combination of local morselized autograft bone that we obtained during the decompression as well as Zimmer DBM. We inserted the prefilled prosthesis into the interspace at L3-4 from the right, we then turned and expanded the prosthesis.  There was a good snug  fit of the prosthesis in the interspace. We then filled and the remainder of the intervertebral disc space with local morselized autograft bone and Medtronic DBM. This completed the posterior lumbar interbody arthrodesis.  During the decompression and insertion of the prosthesis the assistant protected the thecal sac and nerve roots with the D'Errico retractor.  We now turned attention to the instrumentation. Under fluoroscopic guidance we cannulated the bilateral L3 pedicles with the bone probe. We then removed the bone probe. We then tapped the pedicle with a 6.0 millimeter tap. We then removed the tap. We probed inside the tapped pedicle with a ball probe to rule out cortical breaches. We then inserted a 7.0 x 55 millimeter pedicle screw into the L3 pedicles bilaterally under fluoroscopic guidance. We then palpated along the medial aspect of the pedicles to rule out cortical breaches. There were none. The nerve roots were not injured. We then connected the unilateral pedicle screws with a lordotic rod at L3-4 and the left and L3-L5 on the right. We compressed the construct and secured the rod in place with the caps. We then tightened the caps appropriately. This completed the instrumentation from 3 to L5.  We now turned our attention to the posterior lateral arthrodesis at L3-4 and the redo posterolateral arthrodesis at L4-5.  We cleared the soft tissue from the facets at L4-5 use electrocautery.  We used the high-speed drill to decorticate the remainder of the facets, pars, transverse process at L3-4 and L4-5 bilaterally. We then applied a combination of local morselized autograft bone and Tronic DBM over these decorticated posterior lateral structures. This completed the posterior lateral arthrodesis at L3-4 and the redo posterolateral arthrodesis at L4-5.  We then obtained hemostasis using bipolar electrocautery. We irrigated the wound out with vashe solution. We inspected the thecal sac and nerve roots  and noted they were well decompressed. We then removed the retractor.  We injected Exparel .  We placed a medium Hemovac drain in the epidural space and tunneled it out through a separate stab wound.  We reapproximated patient's thoracolumbar fascia with interrupted #1 Vicryl suture. We reapproximated patient's subcutaneous tissue with interrupted 2-0 Vicryl suture. The reapproximated patient's skin with Steri-Strips and benzoin. The wound was then coated with bacitracin ointment. A sterile dressing was applied. The drapes were removed. The patient was subsequently returned to the supine position where they were extubated by the anesthesia team. He was then transported to the post anesthesia care unit in stable condition. All sponge instrument and needle counts were reportedly correct at the end of this case.

## 2024-04-14 NOTE — H&P (Signed)
 Subjective: The patient is a 63 year old white female whose had previous lumbar fusions.  She has developed recurrent back and right greater than left buttock and leg pain consistent with neurogenic claudication.  She failed medical management and was worked up with a lumbar MRI which demonstrated significant stenosis at L3-4.  I discussed the various treatment options with her.  She has decided proceed with surgery.  Past Medical History:  Diagnosis Date   Allergy    Anxiety    Arthritis    Back pain    Cyst of left breast    GERD (gastroesophageal reflux disease)    Headache    History of endometriosis    History of kidney stones 2019   Has not had issues since   History of ovarian cyst    PTSD (post-traumatic stress disorder)    Tinnitus     Past Surgical History:  Procedure Laterality Date   BIOPSY  03/27/2023   Procedure: BIOPSY;  Surgeon: Unk Corinn Skiff, MD;  Location: [facility] ENDOSCOPY;  Service: Gastroenterology;;   COLONOSCOPY WITH PROPOFOL  N/A 03/21/2021   Procedure: COLONOSCOPY WITH PROPOFOL ;  Surgeon: Janalyn Keene NOVAK, MD;  Location: [facility] ENDOSCOPY;  Service: Endoscopy;  Laterality: N/A;   COLONOSCOPY WITH PROPOFOL  N/A 05/08/2022   Procedure: COLONOSCOPY WITH PROPOFOL ;  Surgeon: Unk Corinn Skiff, MD;  Location: [facility] ENDOSCOPY;  Service: Gastroenterology;  Laterality: N/A;   ESOPHAGOGASTRODUODENOSCOPY (EGD) WITH PROPOFOL  N/A 03/27/2023   Procedure: ESOPHAGOGASTRODUODENOSCOPY (EGD) WITH PROPOFOL ;  Surgeon: Unk Corinn Skiff, MD;  Location: [facility] ENDOSCOPY;  Service: Gastroenterology;  Laterality: N/A;   KIDNEY STONE SURGERY  2019   LAPAROSCOPIC VAGINAL HYSTERECTOMY     SPINAL FUSION  2018   SPINE SURGERY      Allergies  Allergen Reactions   Hydrocodone Nausea And Vomiting and Other (See Comments)    With prolonged use only. Headache, chest pain, sweats/chills.   Meperidine Other (See Comments)    Dose dependent. Hypotension, loss of consciousness.   Opium   Other (See Comments)    Hot and cold chills, vomiting, headache and chest pain   Oxycodone  Nausea And Vomiting and Other (See Comments)    With prolonged use only. Headache, chest pain, sweats/chills.    Hydromorphone  Nausea And Vomiting    Social History   Tobacco Use   Smoking status: Never   Smokeless tobacco: Never  Substance Use Topics   Alcohol use: Yes    Alcohol/week: 1.0 standard drink of alcohol    Types: 1 Glasses of wine per week    Comment: glass of wine once a week    Family History  Problem Relation Age of Onset   Autoimmune disease Mother    Heart attack Father 2   Cancer - Colon Maternal Grandmother    Alzheimer's disease Maternal Grandmother    Heart attack Maternal Grandfather    Bone cancer Paternal Grandmother    Prior to Admission medications   Medication Sig Start Date End Date Taking? Authorizing Provider  ALPRAZolam  (XANAX ) 0.5 MG tablet Take 0.5 mg by mouth 3 (three) times daily as needed for anxiety. 06/22/20  Yes [provider]  ascorbic acid (VITAMIN C) 500 MG tablet Take 500 mg by mouth daily.   Yes [provider]  Cholecalciferol (VITAMIN D-1000 MAX ST) 25 MCG (1000 UT) tablet Take 1,000 Units by mouth daily.   Yes [provider]  Collagen-Vitamin C-Biotin (COLLAGEN PO) Take 1 tablet by mouth daily. Dosage unknown   Yes [provider]  diclofenac (VOLTAREN)  50 MG EC tablet Take 50 mg by mouth 2 (two) times daily as needed. 10/24/23  Yes [provider]  escitalopram (LEXAPRO) 10 MG tablet Take 10 mg by mouth daily. 03/02/23  Yes [provider]  estradiol  (VIVELLE -DOT) 0.05 MG/24HR patch Place 1 patch (0.05 mg total) onto the skin 2 (two) times a week. 03/17/23  Yes Copland, Alicia B, PA-C  eszopiclone  (LUNESTA ) 2 MG TABS tablet Take 2 mg by mouth at bedtime as needed for sleep. HOLD if taking 3 mg eszopiclone  dose instead - see additional order   Yes [provider]  Eszopiclone  3 MG  TABS TAKE 1 TABLET BY MOUTH AT BEDTIME TAKE IMMEDIATELY BEFORE BEDTIME Patient taking differently: Take 3 mg by mouth at bedtime as needed (insomnia). May take in place of 2 mg dose if needed for hydrocodone-induced insomnia. Do not take together. 01/15/23  Yes Maribeth Camellia MATSU, MD  ezetimibe (ZETIA) 10 MG tablet Take 10 mg by mouth daily. 03/25/24  Yes [provider]  famotidine (PEPCID) 20 MG tablet Take 20 mg by mouth daily as needed for heartburn.   Yes [provider]  loratadine (CLARITIN) 10 MG tablet Take 10 mg by mouth daily as needed.   Yes [provider]  MAGNESIUM COMPLEX PO Take 1 tablet by mouth daily.   Yes [provider]  Multiple Vitamin (MULTI-VITAMIN) tablet Take 1 tablet by mouth daily.   Yes [provider]  Omega-3 Fatty Acids (FISH OIL) 1200 MG CAPS Take 1 capsule by mouth daily.   Yes [provider]  OVER THE COUNTER MEDICATION Take 1 capsule by mouth daily. Fatty15 C15:0 Pentadecanoic Acid   Yes [provider]  oxyCODONE-acetaminophen (PERCOCET/ROXICET) 5-325 MG tablet Take 1 tablet by mouth every 4 (four) hours as needed. 03/09/24  Yes [provider]  pantoprazole  (PROTONIX ) 40 MG tablet TAKE 1 TABLET BY MOUTH EVERY DAY 01/30/23  Yes Maribeth Camellia MATSU, MD  Potassium 99 MG TABS Take 1 tablet by mouth daily.   Yes [provider]  praziquantel (BILTRICIDE) 600 MG tablet Take 600 mg by mouth See admin instructions. Take 600mg  every 8 hours for three doses. Repeat course at 3 weeks and 6 weeks. 02/27/24  Yes [provider]  Probiotic Product (PROBIOTIC BLEND PO) Take 1 capsule by mouth daily.   Yes [provider]  pyridoxine (B-6) 100 MG tablet Take 100 mg by mouth daily.   Yes [provider]  Vitamin E (VITAMIN E/D-ALPHA NATURAL) 268 MG (400 UNIT) CAPS Take 400 Units by mouth daily.   Yes [provider]  estradiol  (ESTRACE  VAGINAL) 0.1 MG/GM vaginal  cream Insert 1 g vaginally nightly for 1 week, then once wkly as maintenance Patient taking differently: Place 1 Applicatorful vaginally daily as needed. 03/17/23   Copland, Alicia B, PA-C  SUMAtriptan  (IMITREX ) 100 MG tablet TAKE 1 TABLET FOR MIGRAINE AS NEEDED. MAY REPEAT IN 2 HOURS IF HEADACHE PERSISTS. MAX 2 TABS PER DAY 01/01/23   Maribeth Camellia MATSU, MD     Review of Systems  Positive ROS: As above  All other systems have been reviewed and were otherwise negative with the exception of those mentioned in the HPI and as above.  Objective: Vital signs in last 24 hours: Temp:  [97.9 F (36.6 C)] 97.9 F (36.6 C) (11/19 0711) Pulse Rate:  [78] 78 (11/19 0711) Resp:  [18] 18 (11/19 0711) BP: (99)/(70) 99/70 (11/19 0711) SpO2:  [97 %] 97 % (11/19 0711) Weight:  [  72.6 kg] 72.6 kg (11/19 0711) Estimated body mass index is 24.33 kg/m as calculated from the following:   Height as of this encounter: 5' 8 (1.727 m).   Weight as of this encounter: 72.6 kg.   General Appearance: Alert Head: Normocephalic, without obvious abnormality, atraumatic Eyes: PERRL, conjunctiva/corneas clear, EOM's intact,    Ears: Normal  Throat: Normal  Neck: Supple, Back: Her lumbar incision is well-healed. Lungs: Clear to auscultation bilaterally, respirations unlabored Heart: Regular rate and rhythm, no murmur, rub or gallop Abdomen: Soft, non-tender Extremities: Extremities normal, atraumatic, no cyanosis or edema Skin: unremarkable  NEUROLOGIC:   Mental status: alert and oriented,Motor Exam - grossly normal Sensory Exam - grossly normal Reflexes:  Coordination - grossly normal Gait - grossly normal Balance - grossly normal Cranial Nerves: I: smell Not tested  II: visual acuity  OS: Normal  OD: Normal   II: visual fields Full to confrontation  II: pupils Equal, round, reactive to light  III,VII: ptosis None  III,IV,VI: extraocular muscles  Full ROM  V: mastication Normal  V: facial light  touch sensation  Normal  V,VII: corneal reflex  Present  VII: facial muscle function - upper  Normal  VII: facial muscle function - lower Normal  VIII: hearing Not tested  IX: soft palate elevation  Normal  IX,X: gag reflex Present  XI: trapezius strength  5/5  XI: sternocleidomastoid strength 5/5  XI: neck flexion strength  5/5  XII: tongue strength  Normal    Data Review Lab Results  Component Value Date   WBC 4.6 04/08/2024   HGB 13.8 04/08/2024   HCT 42.3 04/08/2024   MCV 93.0 04/08/2024   PLT 228 04/08/2024   Lab Results  Component Value Date   NA 137 04/22/2022   K 3.9 04/22/2022   CL 99 04/22/2022   CO2 29 04/22/2022   BUN 23 04/22/2022   CREATININE 1.06 04/22/2022   GLUCOSE 92 04/22/2022   No results found for: INR, PROTIME  Assessment/Plan: Lumbar spinal stenosis, neurogenic claudication, lumbago, lumbar radiculopathy: I have discussed the situation with the patient.  I reviewed her imaging studies with her and pointed out the abnormalities.  We have discussed the various treatment options including surgery.  I have described the surgical treatment option, and L3-4 decompression instrumentation fusion and exploration with previous fusion.  I have shown her surgical models.  I have given her surgical pamphlet.  We have discussed the risk, benefits, alternatives, expected postoperative course, and likelihood of achieving our goals with surgery.  I have answered all the patient's questions.  She has decided proceed with surgery.   Reyes JONETTA Budge 04/14/2024 8:14 AM

## 2024-04-14 NOTE — Anesthesia Procedure Notes (Signed)
 Procedure Name: Intubation Date/Time: 04/14/2024 8:47 AM  Performed by: Roslynn Waddell LABOR, CRNAPre-anesthesia Checklist: Patient identified, Emergency Drugs available, Suction available and Patient being monitored Patient Re-evaluated:Patient Re-evaluated prior to induction Oxygen Delivery Method: Circle System Utilized Preoxygenation: Pre-oxygenation with 100% oxygen Induction Type: IV induction Ventilation: Mask ventilation without difficulty Laryngoscope Size: Mac and 3 Grade View: Grade I Tube type: Oral Tube size: 7.0 mm Number of attempts: 1 Airway Equipment and Method: Stylet and Oral airway Placement Confirmation: ETT inserted through vocal cords under direct vision, positive ETCO2 and breath sounds checked- equal and bilateral Secured at: 21 cm Tube secured with: Tape Dental Injury: Teeth and Oropharynx as per pre-operative assessment  Comments: Atraumatic induction/intubation. Dentition and oral mucosa as per preop.

## 2024-04-14 NOTE — Transfer of Care (Signed)
 Immediate Anesthesia Transfer of Care Note  Patient: Lisa Gonzalez  Procedure(s) Performed: POSTERIOR LUMBAR INTERBODY FUSION LUMBAR THREE-FOUR  Patient Location: PACU  Anesthesia Type:General  Level of Consciousness: awake and alert   Airway & Oxygen Therapy: Patient Spontanous Breathing  Post-op Assessment: Report given to RN and Post -op Vital signs reviewed and stable  Post vital signs: Reviewed and stable  Last Vitals:  Vitals Value Taken Time  BP 88/55 04/14/24 13:05  Temp    Pulse 83 04/14/24 13:06  Resp 16 04/14/24 13:06  SpO2 93 % 04/14/24 13:06  Vitals shown include unfiled device data.  Last Pain:  Vitals:   04/14/24 0737  TempSrc:   PainSc: 5       Patients Stated Pain Goal: 2 (04/14/24 0724)  Complications: No notable events documented.

## 2024-04-14 NOTE — Anesthesia Postprocedure Evaluation (Signed)
 Anesthesia Post Note  Patient: ARLYSS WEATHERSBY  Procedure(s) Performed: POSTERIOR LUMBAR INTERBODY FUSION LUMBAR THREE-FOUR     Patient location during evaluation: Nursing Unit Anesthesia Type: General Level of consciousness: awake and alert Pain management: pain level controlled Vital Signs Assessment: post-procedure vital signs reviewed and stable Respiratory status: spontaneous breathing, nonlabored ventilation and respiratory function stable Cardiovascular status: blood pressure returned to baseline and stable Postop Assessment: no apparent nausea or vomiting Anesthetic complications: no   No notable events documented.  Last Vitals:  Vitals:   04/14/24 1900 04/14/24 1928  BP: 99/64 (!) 98/59  Pulse: 96 91  Resp: 16 18  Temp: 37 C 37.4 C  SpO2: 98% 98%    Last Pain:  Vitals:   04/14/24 1942  TempSrc:   PainSc: 8                  Garnette FORBES Skillern

## 2024-04-14 NOTE — Progress Notes (Signed)
 Orthopedic Tech Progress Note Patient Details:  Lisa Gonzalez 28-Nov-1960 [id number]  Patient ID: Lisa Gonzalez, female   DOB: December 01, 1960, 63 y.o.   MRN: [id number] Pt. Has lumbar brace. Adine MARLA Blush 04/14/2024, 3:50 PM

## 2024-04-15 ENCOUNTER — Other Ambulatory Visit (HOSPITAL_COMMUNITY): Payer: Self-pay

## 2024-04-15 DIAGNOSIS — M48062 Spinal stenosis, lumbar region with neurogenic claudication: Secondary | ICD-10-CM | POA: Diagnosis not present

## 2024-04-15 MED ORDER — OXYCODONE-ACETAMINOPHEN 5-325 MG PO TABS
1.0000 | ORAL_TABLET | ORAL | 0 refills | Status: AC | PRN
  Filled 2024-04-15 (×2): qty 30, 3d supply, fill #0

## 2024-04-15 MED ORDER — METHOCARBAMOL 500 MG PO TABS
500.0000 mg | ORAL_TABLET | Freq: Four times a day (QID) | ORAL | 0 refills | Status: AC | PRN
  Filled 2024-04-15: qty 30, 8d supply, fill #0

## 2024-04-15 MED ORDER — POLYETHYLENE GLYCOL 3350 17 GM/SCOOP PO POWD
17.0000 g | Freq: Every day | ORAL | 0 refills | Status: AC
  Filled 2024-04-15: qty 238, 14d supply, fill #0

## 2024-04-15 MED ORDER — OXYCODONE-ACETAMINOPHEN 5-325 MG PO TABS: 1.0000 | ORAL_TABLET | ORAL | 0 refills | Status: DC | PRN

## 2024-04-15 MED ORDER — POLYETHYLENE GLYCOL 3350 17 G PO PACK: 17.0000 g | PACK | Freq: Every day | ORAL | 0 refills | Status: DC

## 2024-04-15 MED ORDER — METHOCARBAMOL 500 MG PO TABS: 500.0000 mg | ORAL_TABLET | Freq: Four times a day (QID) | ORAL | 1 refills | Status: DC | PRN

## 2024-04-15 MED FILL — Heparin Sodium (Porcine) Inj 1000 Unit/ML: INTRAMUSCULAR | Qty: 30 | Status: AC

## 2024-04-15 MED FILL — Sodium Chloride IV Soln 0.9%: INTRAVENOUS | Qty: 2000 | Status: AC

## 2024-04-15 MED FILL — Thrombin For Soln 5000 Unit: CUTANEOUS | Qty: 5000 | Status: AC

## 2024-04-15 NOTE — Discharge Summary (Signed)
 Physician Discharge Summary     Providing Compassionate, Quality Care - Together   Patient ID: Lisa Gonzalez MRN: [id number] DOB/AGE: [birth year] 63 y.o.  Admit date: 04/14/2024 Discharge date: 04/15/2024  Admission Diagnoses: Spinal stenosis of lumbar region with neurogenic claudication  Discharge Diagnoses:  Principal Problem:   Spinal stenosis of lumbar region with neurogenic claudication   Discharged Condition: good  Hospital Course: Patient underwent extension of her fusion to L3-4 by Dr. Mavis on 04/14/2024. She was admitted to 3C06  following recovery from anesthesia in the PACU. Her postoperative course has been uncomplicated. She has worked with both physical and occupational therapies who feel the patient is ready for discharge home. She is ambulating independently and without difficulty. She is tolerating a normal diet. She is not having any bowel or bladder dysfunction. Her pain is well-controlled with oral pain medication. She is ready for discharge home.   Consults: PT/TOC  Significant Diagnostic Studies: radiology: DG Lumbar Spine 2-3 Views Result Date: 04/14/2024 EXAM: 2 or 3 VIEW(S) XRAY OF THE LUMBAR SPINE 04/14/2024 12:10:00 PM COMPARISON: None available. CLINICAL HISTORY: Lumbar fusion. FINDINGS: LUMBAR SPINE: BONES: Pedicle screws are in place at L3, L4, and L5. DISCS AND DEGENERATIVE CHANGES: Interbody spacer is present at L3-L4. Preexisting graft/spacer is present at L4-L5. SOFT TISSUES: No acute abnormality. IMPRESSION: 1. Interoperative images demonstrate pedicle screws at L3, L4, and L5. Electronically signed by: Ryan Salvage MD 04/14/2024 04:30 PM EST RP Workstation: [id number]   DG C-Arm 1-60 Min-No Report Result Date: 04/14/2024 Fluoroscopy was utilized by the requesting physician.  No radiographic interpretation.     Treatments: surgery:  Bilateral L3-4 laminotomy/foraminotomies/medial facetectomy to decompress the bilateral L3 and L4 nerve  roots(the work required to do this was in addition to the work required to do the posterior lumbar interbody fusion because of the patient's spinal stenosis, facet arthropathy. Etc. requiring a wide decompression of the nerve roots.); right L3-4 transforaminal lumbar interbody fusion with local morselized autograft bone and Zimmer DBM; insertion of interbody prosthesis at L3-4 (globus peek expandable interbody prosthesis); posterior segmental instrumentation from L3 to 5 with DePuy titanium pedicle screws and rods; posterior lateral arthrodesis at 3 4 and redo L4-5 posterolateral arthrodesis with local morselized autograft bone and Medtronic DBM; exploration of lumbar fusion/removal of lumbar hardware.   Discharge Exam: Blood pressure (!) 95/48, pulse 80, temperature 97.7 F (36.5 C), temperature source Oral, resp. rate 16, height 5' 8 (1.727 m), weight 72.6 kg, SpO2 100%.  Alert and oriented x 4 PERRLA CN II-XII grossly intact MAE, Strength and sensation intact Incision is covered with Honeycomb dressing and Steri Strips; Dressing is clean, dry, and intact   Disposition: Discharge disposition: 01-Home or Self Care       Discharge Instructions     Call MD for:  difficulty breathing, headache or visual disturbances   Complete by: As directed    Call MD for:  hives   Complete by: As directed    Call MD for:  persistant nausea and vomiting   Complete by: As directed    Call MD for:  redness, tenderness, or signs of infection (pain, swelling, redness, odor or green/yellow discharge around incision site)   Complete by: As directed    Call MD for:  severe uncontrolled pain   Complete by: As directed    Diet - low sodium heart healthy   Complete by: As directed    If the dressing is still on your incision site when you go home,  remove it on the third day after your surgery date. Remove dressing if it begins to fall off, or if it is dirty or damaged before the third day.   Complete by: As  directed    Increase activity slowly   Complete by: As directed       Allergies as of 04/15/2024       Reactions   Hydrocodone Nausea And Vomiting, Other (See Comments)   With prolonged use only. Headache, chest pain, sweats/chills.   Meperidine Other (See Comments)   Dose dependent. Hypotension, loss of consciousness.   Opium Other (See Comments)   Hot and cold chills, vomiting, headache and chest pain   Oxycodone Nausea And Vomiting, Other (See Comments)   With prolonged use only. Headache, chest pain, sweats/chills.   Hydromorphone Nausea And Vomiting        Medication List     STOP taking these medications    diclofenac 50 MG EC tablet Commonly known as: VOLTAREN       TAKE these medications    ALPRAZolam  0.5 MG tablet Commonly known as: XANAX  Take 0.5 mg by mouth 3 (three) times daily as needed for anxiety.   ascorbic acid 500 MG tablet Commonly known as: VITAMIN C Take 500 mg by mouth daily.   COLLAGEN PO Take 1 tablet by mouth daily. Dosage unknown   escitalopram 10 MG tablet Commonly known as: LEXAPRO Take 10 mg by mouth daily.   estradiol  0.05 MG/24HR patch Commonly known as: Vivelle -Dot Place 1 patch (0.05 mg total) onto the skin 2 (two) times a week.   estradiol  0.1 MG/GM vaginal cream Commonly known as: ESTRACE  VAGINAL Insert 1 g vaginally nightly for 1 week, then once wkly as maintenance What changed:  how much to take how to take this when to take this reasons to take this additional instructions   eszopiclone  2 MG Tabs tablet Commonly known as: LUNESTA  Take 2 mg by mouth at bedtime as needed for sleep. HOLD if taking 3 mg eszopiclone  dose instead - see additional order What changed: Another medication with the same name was changed. Make sure you understand how and when to take each.   Eszopiclone  3 MG Tabs TAKE 1 TABLET BY MOUTH AT BEDTIME TAKE IMMEDIATELY BEFORE BEDTIME What changed:  how much to take how to take this when to  take this reasons to take this additional instructions   ezetimibe 10 MG tablet Commonly known as: ZETIA Take 10 mg by mouth daily.   famotidine 20 MG tablet Commonly known as: PEPCID Take 20 mg by mouth daily as needed for heartburn.   Fish Oil 1200 MG Caps Take 1 capsule by mouth daily.   loratadine 10 MG tablet Commonly known as: CLARITIN Take 10 mg by mouth daily as needed.   MAGNESIUM COMPLEX PO Take 1 tablet by mouth daily.   methocarbamol  500 MG tablet Commonly known as: ROBAXIN  Take 1 tablet (500 mg total) by mouth every 6 (six) hours as needed for muscle spasms.   Multi-Vitamin tablet Take 1 tablet by mouth daily.   OVER THE COUNTER MEDICATION Take 1 capsule by mouth daily. Fatty15 C15:0 Pentadecanoic Acid   oxyCODONE-acetaminophen 5-325 MG tablet Commonly known as: Percocet Take 1-2 tablets by mouth every 4 (four) hours as needed for severe pain (pain score 7-10). What changed:  how much to take reasons to take this   pantoprazole  40 MG tablet Commonly known as: PROTONIX  TAKE 1 TABLET BY MOUTH EVERY DAY   polyethylene  glycol 17 g packet Commonly known as: MiraLax Take 17 g by mouth daily.   Potassium 99 MG Tabs Take 1 tablet by mouth daily.   praziquantel 600 MG tablet Commonly known as: BILTRICIDE Take 600 mg by mouth See admin instructions. Take 600mg  every 8 hours for three doses. Repeat course at 3 weeks and 6 weeks.   PROBIOTIC BLEND PO Take 1 capsule by mouth daily.   pyridoxine 100 MG tablet Commonly known as: B-6 Take 100 mg by mouth daily.   SUMAtriptan  100 MG tablet Commonly known as: IMITREX  TAKE 1 TABLET FOR MIGRAINE AS NEEDED. MAY REPEAT IN 2 HOURS IF HEADACHE PERSISTS. MAX 2 TABS PER DAY   Vitamin D-1000 Max St 25 MCG (1000 UT) tablet Generic drug: Cholecalciferol Take 1,000 Units by mouth daily.   Vitamin E/D-Alpha Natural 268 MG (400 UNIT) Caps Generic drug: Vitamin E Take 400 Units by mouth daily.                Discharge Care Instructions  (From admission, onward)           Start     Ordered   04/15/24 0000  If the dressing is still on your incision site when you go home, remove it on the third day after your surgery date. Remove dressing if it begins to fall off, or if it is dirty or damaged before the third day.        04/15/24 1059            Follow-up Information     Mavis Purchase, MD. Go on 05/07/2024.   Specialty: Neurosurgery Why: First post op appointment with x-rays is on 05/07/2024 at 8:30 AM. Contact information: [address] [place] [address] [phone]                 Signed: Gerard Beck, DNP, AGNP-C Nurse Practitioner  [facility] [address], [address], [place], [place] [address] P: [phone]    F: [phone]  04/15/2024, 11:01 AM

## 2024-04-15 NOTE — Progress Notes (Signed)
 Patient alert and oriented, voided, ambulated. Surgical site clean and dry no sign of infection. D/c instructions explain to the patient all questions answered. Patient d/c home per order.

## 2024-04-15 NOTE — Evaluation (Signed)
 Physical Therapy Evaluation and Discharge Patient Details Name: Lisa Gonzalez MRN: [id number] DOB: 03/31/1961 Today's Date: 04/15/2024  History of Present Illness  Pt is a 63 y/o female who presents s/p L3-L4 PLIF on 04/14/2024. PMH significant for PTSD, prior spinal fusion 2018.  Clinical Impression  Patient evaluated by Physical Therapy with no further acute PT needs identified. All education has been completed and the patient has no further questions. Pt was able to demonstrate transfers and ambulation with gross modified independence and no AD. Pt was educated on precautions, brace application/wearing schedule, appropriate activity progression, and car transfer. See below for any follow-up Physical Therapy or equipment needs. PT is signing off. Thank you for this referral.         If plan is discharge home, recommend the following: Assist for transportation;Help with stairs or ramp for entrance   Can travel by private vehicle        Equipment Recommendations None recommended by PT  Recommendations for Other Services       Functional Status Assessment Patient has had a recent decline in their functional status and demonstrates the ability to make significant improvements in function in a reasonable and predictable amount of time.     Precautions / Restrictions Precautions Precautions: Fall;Back Precaution Booklet Issued: Yes (comment) Recall of Precautions/Restrictions: Intact Precaution/Restrictions Comments: Reviewed handout and pt was cued for precautions during functional mobility. Required Braces or Orthoses: Spinal Brace Spinal Brace: Lumbar corset;Applied in sitting position Restrictions Weight Bearing Restrictions Per Provider Order: No      Mobility  Bed Mobility Overal bed mobility: Needs Assistance Bed Mobility: Rolling, Sidelying to Sit Rolling: Independent Sidelying to sit: Independent       General bed mobility comments: HOB flat and rails lowered  to simulate home environment    Transfers Overall transfer level: Modified independent Equipment used: None               General transfer comment: Good posture throughout transfers. No assist required.    Ambulation/Gait Ambulation/Gait assistance: Modified independent (Device/Increase time) Gait Distance (Feet): 560 Feet Assistive device: None Gait Pattern/deviations: WFL(Within Functional Limits) Gait velocity: Decreased Gait velocity interpretation: 1.31 - 2.62 ft/sec, indicative of limited community ambulator   General Gait Details: Decreased gait speed but overall ambulating well without difficulty or unsteadiness.  Stairs Stairs: Yes Stairs assistance: Modified independent (Device/Increase time) Stair Management: One rail Right, Alternating pattern, Forwards Number of Stairs: 10 General stair comments: VC's for sequencing and general safety. Encouraged step-to if pattern if pt was feeling more stiff or sore at home, however pt demonstrated good alternating step pattern during session.  Wheelchair Mobility     Tilt Bed    Modified Rankin (Stroke Patients Only)       Balance Overall balance assessment: Needs assistance Sitting-balance support: Feet supported, No upper extremity supported Sitting balance-Leahy Scale: Fair     Standing balance support: During functional activity, No upper extremity supported Standing balance-Leahy Scale: Fair                               Pertinent Vitals/Pain Pain Assessment Pain Assessment: Faces Faces Pain Scale: Hurts little more Pain Location: back Pain Descriptors / Indicators: Operative site guarding, Sore Pain Intervention(s): Limited activity within patient's tolerance, Monitored during session, Repositioned    Home Living Family/patient expects to be discharged to:: Private residence Living Arrangements: Spouse/significant other;Children Available Help at Discharge: Family;Available 24  hours/day  Type of Home: House Home Access: Stairs to enter   [employer] of Steps: 2 Alternate Level Stairs-Number of Steps: flight Home Layout: Two level;Bed/bath upstairs Home Equipment: Shower seat - built in      Prior Function Prior Level of Function : Independent/Modified Independent             Mobility Comments: Spending most of her time in the bed due to pain, in and out of PT for 2 years       Extremity/Trunk Assessment   Upper Extremity Assessment Upper Extremity Assessment: Overall WFL for tasks assessed    Lower Extremity Assessment Lower Extremity Assessment: Generalized weakness (Mild; consistent with pre-op diganosis)    Cervical / Trunk Assessment Cervical / Trunk Assessment: Back Surgery  Communication   Communication Communication: No apparent difficulties    Cognition Arousal: Alert Behavior During Therapy: WFL for tasks assessed/performed   PT - Cognitive impairments: No apparent impairments                         Following commands: Intact       Cueing Cueing Techniques: Verbal cues, Gestural cues     General Comments      Exercises     Assessment/Plan    PT Assessment Patient does not need any further PT services  PT Problem List         PT Treatment Interventions      PT Goals (Current goals can be found in the Care Plan section)  Acute Rehab PT Goals Patient Stated Goal: Home today PT Goal Formulation: All assessment and education complete, DC therapy    Frequency       Co-evaluation               AM-PAC PT 6 Clicks Mobility  Outcome Measure Help needed turning from your back to your side while in a flat bed without using bedrails?: None Help needed moving from lying on your back to sitting on the side of a flat bed without using bedrails?: None Help needed moving to and from a bed to a chair (including a wheelchair)?: None Help needed standing up from a chair using your arms (e.g.,  wheelchair or bedside chair)?: None Help needed to walk in hospital room?: None Help needed climbing 3-5 steps with a railing? : None 6 Click Score: 24    End of Session Equipment Utilized During Treatment: Gait belt;Back brace Activity Tolerance: Patient tolerated treatment well Patient left: Other (comment);with family/visitor present (Standing in room preparing to get dressed) Nurse Communication: Mobility status PT Visit Diagnosis: Unsteadiness on feet (R26.81);Pain Pain - part of body:  (back)    Time: 9072-9052 PT Time Calculation (min) (ACUTE ONLY): 20 min   Charges:   PT Evaluation $PT Eval Low Complexity: 1 Low   PT General Charges $$ ACUTE PT VISIT: 1 Visit         Leita Sable, PT, DPT Acute Rehabilitation Services Secure Chat Preferred Office: [phone]   Leita JONETTA Sable 04/15/2024, 10:48 AM

## 2024-04-15 NOTE — Discharge Instructions (Signed)

## 2024-06-08 ENCOUNTER — Other Ambulatory Visit: Payer: Self-pay

## 2024-06-08 DIAGNOSIS — Z1231 Encounter for screening mammogram for malignant neoplasm of breast: Secondary | ICD-10-CM

## 2024-07-01 ENCOUNTER — Other Ambulatory Visit: Payer: Self-pay | Admitting: Medical Genetics

## 2024-07-07 ENCOUNTER — Encounter

## 2024-08-16 ENCOUNTER — Ambulatory Visit: Admitting: Obstetrics & Gynecology
